# Patient Record
Sex: Female | Born: 1960 | Race: White | Hispanic: No | Smoking: Never smoker
Health system: Southern US, Community
[De-identification: ages and names within clinical notes are randomized; demographics above are authoritative.]

## PROBLEM LIST (undated history)

## (undated) DIAGNOSIS — G4700 Insomnia, unspecified: Secondary | ICD-10-CM

## (undated) DIAGNOSIS — I509 Heart failure, unspecified: Secondary | ICD-10-CM

## (undated) DIAGNOSIS — T07XXXA Unspecified multiple injuries, initial encounter: Secondary | ICD-10-CM

## (undated) DIAGNOSIS — K219 Gastro-esophageal reflux disease without esophagitis: Secondary | ICD-10-CM

## (undated) DIAGNOSIS — E7800 Pure hypercholesterolemia, unspecified: Secondary | ICD-10-CM

## (undated) DIAGNOSIS — L80 Vitiligo: Secondary | ICD-10-CM

## (undated) DIAGNOSIS — K759 Inflammatory liver disease, unspecified: Secondary | ICD-10-CM

## (undated) DIAGNOSIS — I429 Cardiomyopathy, unspecified: Secondary | ICD-10-CM

## (undated) HISTORY — DX: Pure hypercholesterolemia, unspecified: E78.00

## (undated) HISTORY — DX: Heart failure, unspecified: I50.9

## (undated) HISTORY — DX: Vitiligo: L80

## (undated) HISTORY — DX: Inflammatory liver disease, unspecified: K75.9

## (undated) HISTORY — DX: Unspecified multiple injuries, initial encounter: T07.XXXA

## (undated) HISTORY — DX: Gastro-esophageal reflux disease without esophagitis: K21.9

## (undated) HISTORY — DX: Insomnia, unspecified: G47.00

## (undated) HISTORY — DX: Cardiomyopathy, unspecified: I42.9

## (undated) HISTORY — PX: CARDIAC CATHETERIZATION: SHX172

---

## 2010-07-24 ENCOUNTER — Ambulatory Visit (HOSPITAL_COMMUNITY): Admission: RE | Admit: 2010-07-24 | Discharge: 2010-07-24 | Payer: Self-pay | Admitting: Family Medicine

## 2011-11-03 ENCOUNTER — Other Ambulatory Visit (HOSPITAL_COMMUNITY): Payer: Self-pay | Admitting: Family Medicine

## 2011-11-03 DIAGNOSIS — Z139 Encounter for screening, unspecified: Secondary | ICD-10-CM

## 2011-11-09 ENCOUNTER — Ambulatory Visit (HOSPITAL_COMMUNITY)
Admission: RE | Admit: 2011-11-09 | Discharge: 2011-11-09 | Disposition: A | Discharge status: Home / Self Care | Payer: PRIVATE HEALTH INSURANCE | Source: Ambulatory Visit | Attending: Family Medicine | Admitting: Family Medicine

## 2011-11-09 DIAGNOSIS — Z1231 Encounter for screening mammogram for malignant neoplasm of breast: Secondary | ICD-10-CM | POA: Insufficient documentation

## 2011-11-09 DIAGNOSIS — Z139 Encounter for screening, unspecified: Secondary | ICD-10-CM

## 2011-11-16 ENCOUNTER — Other Ambulatory Visit: Payer: Self-pay | Admitting: Family Medicine

## 2011-11-16 DIAGNOSIS — R928 Other abnormal and inconclusive findings on diagnostic imaging of breast: Secondary | ICD-10-CM

## 2011-11-18 ENCOUNTER — Ambulatory Visit (HOSPITAL_COMMUNITY)
Admission: RE | Admit: 2011-11-18 | Discharge: 2011-11-18 | Disposition: A | Discharge status: Home / Self Care | Payer: PRIVATE HEALTH INSURANCE | Source: Ambulatory Visit | Attending: Family Medicine | Admitting: Family Medicine

## 2011-11-18 DIAGNOSIS — R928 Other abnormal and inconclusive findings on diagnostic imaging of breast: Secondary | ICD-10-CM | POA: Insufficient documentation

## 2014-09-11 ENCOUNTER — Emergency Department (HOSPITAL_COMMUNITY): Payer: 59

## 2014-09-11 ENCOUNTER — Emergency Department (HOSPITAL_COMMUNITY)
Admission: EM | Admit: 2014-09-11 | Discharge: 2014-09-11 | Disposition: A | Discharge status: Home / Self Care | Payer: 59 | Attending: Emergency Medicine | Admitting: Emergency Medicine

## 2014-09-11 ENCOUNTER — Encounter (HOSPITAL_COMMUNITY): Payer: Self-pay | Admitting: Emergency Medicine

## 2014-09-11 DIAGNOSIS — Z79899 Other long term (current) drug therapy: Secondary | ICD-10-CM | POA: Insufficient documentation

## 2014-09-11 DIAGNOSIS — R350 Frequency of micturition: Secondary | ICD-10-CM | POA: Insufficient documentation

## 2014-09-11 DIAGNOSIS — M545 Low back pain, unspecified: Secondary | ICD-10-CM

## 2014-09-11 LAB — URINALYSIS, ROUTINE W REFLEX MICROSCOPIC
BILIRUBIN URINE: NEGATIVE
Glucose, UA: NEGATIVE mg/dL
Hgb urine dipstick: NEGATIVE
KETONES UR: NEGATIVE mg/dL
LEUKOCYTES UA: NEGATIVE
NITRITE: NEGATIVE
PROTEIN: NEGATIVE mg/dL
SPECIFIC GRAVITY, URINE: 1.015 (ref 1.005–1.030)
Urobilinogen, UA: 0.2 mg/dL (ref 0.0–1.0)
pH: 5 (ref 5.0–8.0)

## 2014-09-11 MED ORDER — NAPROXEN 500 MG PO TABS: 500.0000 mg | ORAL_TABLET | Freq: Two times a day (BID) | ORAL | Status: DC

## 2014-09-11 MED ORDER — TRAMADOL HCL 50 MG PO TABS: 50.0000 mg | ORAL_TABLET | Freq: Four times a day (QID) | ORAL | Status: DC | PRN

## 2014-09-11 MED ORDER — KETOROLAC TROMETHAMINE 60 MG/2ML IM SOLN
60.0000 mg | Freq: Once | INTRAMUSCULAR | Status: AC
  Administered 2014-09-11: 60 mg via INTRAMUSCULAR
  Filled 2014-09-11: qty 2

## 2014-09-11 MED ORDER — CYCLOBENZAPRINE HCL 5 MG PO TABS: 5.0000 mg | ORAL_TABLET | Freq: Three times a day (TID) | ORAL | Status: DC | PRN

## 2014-09-11 NOTE — ED Notes (Signed)
Patient states she being having back pain since Sunday morning. Patient states, "I though it was a kidney infection however  I my urine is clear and i haven't had in urgency. Patient states she took hyrdocodine and got very little relief.

## 2014-09-11 NOTE — ED Provider Notes (Signed)
CSN: [id number]     Arrival date & time 09/11/14  1721 History   First MD Initiated Contact with Patient 09/11/14 1738     Chief Complaint  Patient presents with  . Back Pain     (Consider location/radiation/quality/duration/timing/severity/associated sxs/prior Treatment) The history is provided by the patient.   Sara Mcmahon is a 53 y.o. female presenting bilateral low back pain which she woke with 2 mornings ago.  She denies injury or prior history of low back symptoms.  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  She does endorse increased urinary frequency without dysuria and her urine has been clear without hematuria.  She denies fevers or chills.  Pain is worsened with movement and certain positions.  She denies personal or family history of kidney stones.  She took a hydrocodone tablet which improved her symptoms, but made her very nauseated and prefers not to take this medication again.  She also took a Cipro tablet both yesterday morning and this morning over concerned this may be a UTI.  She denies abdominal pain, no nausea, vomiting, fever, bowel changes.  There is no radiation of pain into her legs and she denies weakness or numbness in her lower extremities    History reviewed. No pertinent past medical history. History reviewed. No pertinent past surgical history. No family history on file. History  Substance Use Topics  . Smoking status: Never Smoker   . Smokeless tobacco: Not on file  . Alcohol Use: No   OB History   Grav Para Term Preterm Abortions TAB SAB Ect Mult Living                 Review of Systems  Constitutional: Negative for fever and chills.  Respiratory: Negative for shortness of breath.   Cardiovascular: Negative for chest pain and leg swelling.  Gastrointestinal: Negative for abdominal pain, constipation and abdominal distention.  Genitourinary: Positive for frequency.  Negative for dysuria, urgency, flank pain and difficulty urinating.  Musculoskeletal: Positive for back pain. Negative for gait problem and joint swelling.  Skin: Negative for rash.  Neurological: Negative for weakness and numbness.      Allergies  Review of patient's allergies indicates no known allergies.  Home Medications   Prior to Admission medications   Medication Sig Start Date End Date Taking? Authorizing Provider  cyclobenzaprine (FLEXERIL) 5 MG tablet Take 1 tablet (5 mg total) by mouth 3 (three) times daily as needed for muscle spasms. 09/11/14   Burgess AmorJulie Lenka Zhao, PA-C  naproxen (NAPROSYN) 500 MG tablet Take 1 tablet (500 mg total) by mouth 2 (two) times daily with a meal. 09/11/14   Burgess AmorJulie Charolette Bultman, PA-C  traMADol (ULTRAM) 50 MG tablet Take 1 tablet (50 mg total) by mouth every 6 (six) hours as needed. 09/11/14   Burgess AmorJulie Lisha Vitale, PA-C   BP 124/78  Pulse 78  Temp(Src) 98.2 F (36.8 C) (Oral)  Resp 16  Ht 5\' 4"  (1.626 m)  Wt 139 lb (63.05 kg)  BMI 23.85 kg/m2  SpO2 100% Physical Exam  Nursing note and vitals reviewed. Constitutional: She appears well-developed and well-nourished.  HENT:  Head: Normocephalic.  Eyes: Conjunctivae are normal.  Neck: Normal range of motion. Neck supple.  Cardiovascular: Normal rate and intact distal pulses.   Pedal pulses normal.  Pulmonary/Chest: Effort normal.  Abdominal: Soft. Bowel sounds are normal. She exhibits no distension, no pulsatile midline mass and no mass.  There is no tenderness. There is no rebound, no guarding and no CVA tenderness.  Musculoskeletal: Normal range of motion. She exhibits no edema.       Lumbar back: She exhibits tenderness. She exhibits no swelling, no edema and no spasm.  Tender to palpation across her lower lumbar region, bilaterally including midline.  Neurological: She is alert. She has normal strength. She displays no atrophy and no tremor. No sensory deficit. Gait normal.  Reflex Scores:      Patellar reflexes are  2+ on the right side and 2+ on the left side.      Achilles reflexes are 2+ on the right side and 2+ on the left side. No strength deficit noted in hip and knee flexor and extensor muscle groups.  Ankle flexion and extension intact.  Skin: Skin is warm and dry.  Psychiatric: She has a normal mood and affect.    ED Course  Procedures (including critical care time) Labs Review Labs Reviewed  URINALYSIS, ROUTINE W REFLEX MICROSCOPIC    Imaging Review Dg Lumbar Spine Complete  09/11/2014   CLINICAL DATA:  Back pain. 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  EXAM: LUMBAR SPINE - COMPLETE 4+ VIEW  COMPARISON:  None.  FINDINGS: Normal alignment of the lumbar vertebral bodies. Disc spaces and vertebral bodies are maintained. The facets are normally aligned. No pars defects. The visualized bony pelvis is intact.  IMPRESSION: Normal alignment and no acute bony findings or significant degenerative changes.   Electronically Signed   By: Loralie Champagne M.D.   On: 09/11/2014 18:39     EKG Interpretation None      MDM   Final diagnoses:  Bilateral low back pain without sciatica    No neuro deficit on exam or by history to suggest emergent or surgical presentation.  Also discussed worsened sx that should prompt immediate re-evaluation including distal weakness, bowel/bladder retention/incontinence.  Patients labs and/or radiological studies were viewed and considered during the medical decision making and disposition process. Patient was prescribed Flexeril, Naprosyn and tramadol for when necessary use.  Encouraged activity as tolerated, heating pad or warm tub soak.  Followup with PCP if not improving over the next week.      Burgess Amor, PA-C 09/11/14 1950

## 2014-09-11 NOTE — ED Notes (Signed)
Patient given discharge instruction, verbalized understand. Patient ambulatory out of the department.  

## 2014-09-11 NOTE — ED Provider Notes (Signed)
Medical screening examination/treatment/procedure(s) were performed by non-physician practitioner and as supervising physician I was immediately available for consultation/collaboration.   EKG Interpretation None        Benny LennertJoseph L Juddson Cobern, MD 09/11/14 2344

## 2014-09-11 NOTE — Discharge Instructions (Signed)
Back Pain, Adult °Low back pain is very common. About 1 in 5 people have back pain. The cause of low back pain is rarely dangerous. The pain often gets better over time. About half of people with a sudden onset of back pain feel better in just 2 weeks. About 8 in 10 people feel better by 6 weeks.  °CAUSES °Some common causes of back pain include: °· Strain of the muscles or ligaments supporting the spine. °· Wear and tear (degeneration) of the spinal discs. °· Arthritis. °· Direct injury to the back. °DIAGNOSIS °Most of the time, the direct cause of low back pain is not known. However, back pain can be treated effectively even when the exact cause of the pain is unknown. Answering your caregiver's questions about your overall health and symptoms is one of the most accurate ways to make sure the cause of your pain is not dangerous. If your caregiver needs more information, he or she may order lab work or imaging tests (X-rays or MRIs). However, even if imaging tests show changes in your back, this usually does not require surgery. °HOME CARE INSTRUCTIONS °For many people, back pain returns. Since low back pain is rarely dangerous, it is often a condition that people can learn to manage on their own.  °· Remain active. It is stressful on the back to sit or stand in one place. Do not sit, drive, or stand in one place for more than 30 minutes at a time. Take short walks on level surfaces as soon as pain allows. Try to increase the length of time you walk each day. °· Do not stay in bed. Resting more than 1 or 2 days can delay your recovery. °· Do not avoid exercise or work. Your body is made to move. It is not dangerous to be active, even though your back may hurt. Your back will likely heal faster if you return to being active before your pain is gone. °· Pay attention to your body when you  bend and lift. Many people have less discomfort when lifting if they bend their knees, keep the load close to their bodies, and  avoid twisting. Often, the most comfortable positions are those that put less stress on your recovering back. °· Find a comfortable position to sleep. Use a firm mattress and lie on your side with your knees slightly bent. If you lie on your back, put a pillow under your knees. °· Only take over-the-counter or prescription medicines as directed by your caregiver. Over-the-counter medicines to reduce pain and inflammation are often the most helpful. Your caregiver may prescribe muscle relaxant drugs. These medicines help dull your pain so you can more quickly return to your normal activities and healthy exercise. °· Put ice on the injured area. °¨ Put ice in a plastic bag. °¨ Place a towel between your skin and the bag. °¨ Leave the ice on for 15-20 minutes, 03-04 times a day for the first 2 to 3 days. After that, ice and heat may be alternated to reduce pain and spasms. °· Ask your caregiver about trying back exercises and gentle massage. This may be of some benefit. °· Avoid feeling anxious or stressed. Stress increases muscle tension and can worsen back pain. It is important to recognize when you are anxious or stressed and learn ways to manage it. Exercise is a great option. °SEEK MEDICAL CARE IF: °· You have pain that is not relieved with rest or medicine. °· You have pain that does not improve in 1 week. °· You have new symptoms. °· You are generally not feeling well. °SEEK   IMMEDIATE MEDICAL CARE IF:   You have pain that radiates from your back into your legs.  You develop new bowel or bladder control problems.  You have unusual weakness or numbness in your arms or legs.  You develop nausea or vomiting.  You develop abdominal pain.  You feel faint. Document Released: 11/23/2005 Document Revised: 05/24/2012 Document Reviewed: 03/27/2014 [facility] Patient Information 2015 [place], [place]. This information is not intended to replace advice given to you by your health care provider. Make sure you  discuss any questions you have with your health care provider.   Emergency Department Resource Guide 1) Find a Doctor and Pay Out of Pocket Although you won't have to find out who is covered by your insurance plan, it is a good idea to ask around and get recommendations. You will then need to call the office and see if the doctor you have chosen will accept you as a new patient and what types of options they offer for patients who are self-pay. Some doctors offer discounts or will set up payment plans for their patients who do not have insurance, but you will need to ask so you aren't surprised when you get to your appointment.  2) Contact Your Local Health Department Not all health departments have doctors that can see patients for sick visits, but many do, so it is worth a call to see if yours does. If you don't know where your local health department is, you can check in your phone book. The CDC also has a tool to help you locate your state's health department, and many state websites also have listings of all of their local health departments.  3) Find a [facility] If your illness is not likely to be very severe or complicated, you may want to try a walk in clinic. These are popping up all over the country in pharmacies, drugstores, and shopping centers. They're usually staffed by nurse practitioners or physician assistants that have been trained to treat common illnesses and complaints. They're usually fairly quick and inexpensive. However, if you have serious medical issues or chronic medical problems, these are probably not your best option.  No Primary Care Doctor: - Call Health Connect at  [phone] - they can help you locate a primary care doctor that  accepts your insurance, provides certain services, etc. - Physician Referral Service- [phone]  Chronic Pain Problems: Organization         Address  Phone   Notes  [facility]  [phone] Patients need to  be referred by their primary care doctor.   Medication Assistance: Organization         Address  Phone   Notes  [facility] Medication [facility] [address] [place], [place] [address] [phone] --Must be a resident of [facility] -- Must have NO insurance coverage whatsoever (no Medicaid/ Medicare, etc.) -- The pt. MUST have a primary care doctor that directs their care regularly and follows them in the community   MedAssist  [phone]   [employer]  [phone]    Agencies that provide inexpensive medical care: Organization         Address  Phone   Notes  [place]  [phone]   [facility]    [phone]   [facility] [place], [place] [address] [phone]   [place] [address]  [place] [phone]   Planned Parenthood    [phone]   [facility]    [phone]   Community Health and [place], [place] Phone:  [phone], Fax:  [phone] Hours of Operation:  9 am - 6 pm, M-F.  Also accepts Medicaid/Medicare and self-pay.  [facility] for [place], [address], [place] Phone: [phone], Fax: [phone]. Hours of Operation:  8:30 am - 5:30 pm, M-F.  Also accepts Medicaid and self-pay.  [facility] [place] [address], [place] Phone: [phone]   [place], [place], [place] [phone], Ext. [phone] Mondays & Thursdays: 7-9 AM.  First 15 patients are seen on a first come, first serve basis.    [place] Providers:  Organization         Address  Phone   Notes  [facility] [address], [place] [phone] Also accepts self-pay patients.  [facility] [address], [place]  [phone]   [place], [address], [place] [phone]   [facility] [address] [place] [phone]   Lucianne Lei [address], [place]   [phone] Only accepts [place] Access [place] patients after they have their name applied to their card.   Self-Pay (no insurance) in [facility]:  Organization         Address  Phone   Notes  Sickle Cell Patients, [facility] [place] [phone]   [facility] [place] [phone]   [facility] [place]  [place], [place], [place] [phone]   [facility]/Dr. Osei-Bonsu  [address], [place] or [address], [place] [phone] Phone number for both [place] and [place] locations is the same.  [facility] [address], [place] [phone]   [facility] [address] [place] or [address] [phone]   [facility] [address] [place] [phone], phone; [phone], fax Sees patients 1st and 3rd Saturday of every month.  Must not qualify for public or private insurance (i.e. Medicaid, Medicare, [place] Health Choice, Veterans' Benefits)  Household income should be no more than 200% of the poverty level The clinic cannot treat you if you are pregnant or think you are pregnant  Sexually transmitted diseases are not treated at the clinic.    Dental Care: Organization         Address  Phone  Notes  [facility] Department of [facility] [place] [phone] Accepts children up to age 110 who are enrolled in [place] or [place]; pregnant women with a Medicaid card; and children who have applied for Medicaid or [place] Health Choice, but were declined, whose parents can  pay a reduced fee at time of service.  [facility] of [facility]  [address], [place] [phone] Accepts children up to age 35 who are enrolled in [place] or [place]; pregnant women with a Medicaid card; and children who have applied for Medicaid or [place]  Health Choice, but were declined, whose parents can pay a reduced fee at time of service.  [place] Adult Dental Access PROGRAM  [place] [phone] Patients are seen by appointment only. Walk-ins are not accepted. [place] will see patients 54 years of age and older. Monday - Tuesday (8am-5pm) Most Wednesdays (8:30-5pm) $30 per visit, cash only  [facility] Adult Dental Access PROGRAM  [address], [facility] [phone] Patients are seen by appointment only. Walk-ins are not accepted. [place] will see patients 74 years of age and older. One Wednesday Evening (Monthly: Volunteer Based).  $30 per visit, cash only  [place]  [phone] for adults; Children under age 11, call Graduate Pediatric Dentistry at [phone]. Children aged 13-14, please call [phone] to request a pediatric application.  Dental services are provided in all areas of dental care including fillings, crowns and bridges, complete and partial dentures, implants, gum treatment, root canals, and extractions. Preventive care is also provided. Treatment is provided to both adults and children. Patients are selected via a lottery and there is often a waiting list.   [place] [address], [place]  [phone] [email or web address]   [facility] [address] [place], [place] [phone], Ext. [phone] Second and Fourth Thursday of each month, opens at 6:30 AM; Clinic ends at 9 AM.  Patients are seen on a first-come first-served basis, and a limited number are seen during each clinic.   [facility]  [address] Hillard Danker [place], [place] [phone]   Eligibility Requirements You must have lived in [place], [place], or [place] counties for at least the last three months.   You cannot be eligible for state or federal sponsored [employer], including [employer], [place], or [employer].   You generally cannot be eligible for healthcare insurance through your employer.    How to apply: Eligibility screenings are held every Tuesday and Wednesday afternoon from 1:00 pm until 4:00 pm. You do not need an appointment for the interview!  [facility] [address], [place], [place]   [place]  [place] Department  [place]  [phone]    Behavioral Health Resources in the Community: Intensive Outpatient Programs Organization         Address  Phone  Notes  [facility] [place]. [address] [place], [place] [phone]   [facility] Outpatient [address] [place], [place]   ADS: Alcohol & Drug Svcs [address], [place], [place]   [place] [address]  [place], [place] or [phone]   Substance Abuse Resources Organization         Address  Phone  Notes  Alcohol and Drug Services  [phone]   [place]  [phone]   [facility] [place]   Chinita Pester  [phone]   Residential & Outpatient Substance Abuse Program  [phone]   Psychological Services Organization         Address  Phone  Notes  [facility] [place]  [place]  [phone]   [place] [address] [place] or [phone]    Mobile Crisis Teams Organization         Address  Phone  Notes  [facility], Mobile Crisis Care Unit  [phone]    [facility]  [address] [place], [place] [phone]   [facility] [address] [place] [phone]    Self-Help/Support Groups Organization         Address  Phone             Notes  [facility]ental Health Assoc. of [place] - variety of support groups  [phone]- [phone] Call for more information  Narcotics Anonymous (NA), Caring [facility] [address], [place]  2 meetings at this location   Statisticianesidential Treatment Programs Organization         Address  Phone  Notes  [facility] [address],    [place]  [phone]   [facility]  [address], [place] [phone], [place], [place] [phone]   [facility] [address], [place] [phone] Admissions: 8am-3pm M-F  [facility] [address]    [place], [place] [phone]   [facility] [address], [place], [place] [phone]   [facility] [address]  [place], [place] [phone]   [facility] - Intensive Outpatient [address], [place], [place] [phone]   [facility] ([facility] [address]  [place], [place] [phone] or [phone]   [facility] (RTS) [address] [place], [place] [phone] Accepts Medicaid  [facility] [address]  [place] [phone] Substance Abuse/Addiction Treatment   [facility] Behavioral Health Resources Organization         Address  Phone  Notes  [facility]  [phone]   Angie FavaJulie Brannon, PhD [address], [address] [place], [place]   [phone](336) [phone] or [phone]   [facility] [place]   [address] [place], [place] [phone]) [phone]   [facility] [address], [place], [place] [phone](336) [phone] Insurance/Medicaid/sponsorship through [facility]  [facility] [address]                                     [place], [place] [phone](336) [phone] Therapy/tele-psych/case  [facility] [address]   [place], [place] [phone](336) [phone]    Dr. Lolly MustacheArfeen  [phone]   [facility]  [facility] 1) [address], [place] 2) [address], [place] 3)  [address], [place] [phone](336) [phone](336) [phone]  [phone]   [facility] Child Abuse Hotline [phone]) [phone] or [phone](336) [phone] (After Hours)          Do not drive within 4 hours of taking tramadol as this may make you drowsy.  Avoid lifting,  Bending,  Twisting or any other activity that worsens your pain over the next week.  Apply heat to your back as discussed several times daily.  You should get rechecked if your symptoms are not better over the next 5 days,  Or you develop increased pain,  Weakness in your leg(s) or loss of bladder or bowel function - these are symptoms of a worse injury.

## 2015-12-11 ENCOUNTER — Encounter (HOSPITAL_COMMUNITY): Payer: BLUE CROSS/BLUE SHIELD

## 2016-02-05 ENCOUNTER — Encounter: Payer: Self-pay | Admitting: *Deleted

## 2016-02-05 ENCOUNTER — Ambulatory Visit (INDEPENDENT_AMBULATORY_CARE_PROVIDER_SITE_OTHER): Payer: BLUE CROSS/BLUE SHIELD | Admitting: Cardiovascular Disease

## 2016-02-05 VITALS — BP 128/80 | HR 72 | Ht 64.0 in | Wt 136.0 lb

## 2016-02-05 DIAGNOSIS — I5181 Takotsubo syndrome: Secondary | ICD-10-CM | POA: Diagnosis not present

## 2016-02-05 DIAGNOSIS — R5383 Other fatigue: Secondary | ICD-10-CM | POA: Diagnosis not present

## 2016-02-05 DIAGNOSIS — R0601 Orthopnea: Secondary | ICD-10-CM

## 2016-02-05 DIAGNOSIS — I471 Supraventricular tachycardia, unspecified: Secondary | ICD-10-CM

## 2016-02-05 NOTE — Progress Notes (Signed)
Patient ID: Sara Mcmahon, female   DOB: December 06, 1961, 55 y.o.   MRN: [id number]       CARDIOLOGY CONSULT NOTE  Patient ID: Sara Mcmahon MRN: [id number] DOB/AGE: 01/22/1961 55 y.o.  Admit date: (Not on file) Primary Physician Gwenyth Bender, MD  Reason for Consultation: chest pain  HPI: The patient is a 55 year old woman with a past medical history significant for Takotsubo cardiomyopathy and PSVT. She underwent coronary angiography in September 2016 which was normal. LVEF at that time was 20-25%. She reportedly underwent repeat echocardiogram in October and left ventricular systolic function had normalized. She saw a cardiologist in late 2016 for palpitations and wore a 48-hour Holter monitor which demonstrated sinus rhythm with PACs. She also has a h/o anxiety.  Ever since being diagnosed in September 2016, she has felt markedly fatigued. This reportedly did not improve with normalization of LV function in October. I do not have a copy of the echocardiogram report from this time. She recently had blood work done but these results are also unavailable. She occasionally has a squeezing sensation in her left precordium approximately 3 times per week. This usually occurs at rest. She props her head up on at least 2-3 pillows in order to breathe more comfortably. She denies leg swelling and paroxysmal nocturnal dyspnea. She says she knows something is wrong.  Prior to being diagnosed, she was undergoing a lot of stressors at home as well as regarding finances.   Allergies  Allergen Reactions  . Pollen Extract     Trees, grass, flowers    Current Outpatient Prescriptions  Medication Sig Dispense Refill  . ALPRAZolam (XANAX) 0.5 MG tablet Take 0.5 mg by mouth at bedtime as needed for anxiety.    Marland Kitchen aspirin EC 81 MG tablet Take 81 mg by mouth daily.    . carvedilol (COREG) 3.125 MG tablet Take 1 tablet by mouth 2 (two) times daily.  0  . eszopiclone (LUNESTA) 2 MG TABS tablet Take 1 tablet by  mouth at bedtime.  0  . nitroGLYCERIN (NITROSTAT) 0.4 MG SL tablet Place 0.4 mg under the tongue every 5 (five) minutes as needed for chest pain.    . rosuvastatin (CRESTOR) 5 MG tablet Take 5 mg by mouth daily.  0   No current facility-administered medications for this visit.    Past Medical History  Diagnosis Date  . Hypercholesteremia   . Insomnia   . CHF (congestive heart failure) (HCC)   . Cardiomyopathy [facility])     Past Surgical History  Procedure Laterality Date  . Cardiac catheterization      Social History   Social History  . Marital Status: Widowed    Spouse Name: N/A  . Number of Children: N/A  . Years of Education: N/A   Occupational History  . Not on file.   Social History Main Topics  . Smoking status: Never Smoker   . Smokeless tobacco: Never Used  . Alcohol Use: No  . Drug Use: No  . Sexual Activity: Not on file   Other Topics Concern  . Not on file   Social History Narrative     No family history of premature CAD in 1st degree relatives.  Prior to Admission medications   Medication Sig Start Date End Date Taking? Authorizing Provider  acetaminophen (TYLENOL) 325 MG tablet Take 650 mg by mouth every 4 (four) hours as needed.    Historical Provider, MD  ALPRAZolam (NIRAVAM) 0.5 MG dissolvable tablet Take 1 tablet  by mouth daily. 12/31/15   Historical Provider, MD  aspirin EC 81 MG tablet Take 81 mg by mouth daily.    Historical Provider, MD  carvedilol (COREG) 3.125 MG tablet Take 1 tablet by mouth 2 (two) times daily. 01/27/16   Historical Provider, MD  cyclobenzaprine (FLEXERIL) 5 MG tablet Take 1 tablet (5 mg total) by mouth 3 (three) times daily as needed for muscle spasms. 09/11/14   Burgess Amor, PA-C  eszopiclone (LUNESTA) 2 MG TABS tablet Take 1 tablet by mouth at bedtime. 01/10/16   Historical Provider, MD  naproxen (NAPROSYN) 500 MG tablet Take 1 tablet (500 mg total) by mouth 2 (two) times daily with a meal. 09/11/14   Burgess Amor, PA-C    nitroGLYCERIN (NITROSTAT) 0.4 MG SL tablet Place 0.4 mg under the tongue every 5 (five) minutes as needed for chest pain.    Historical Provider, MD  rosuvastatin (CRESTOR) 5 MG tablet Take 5 mg by mouth daily. 01/21/16   Historical Provider, MD  traMADol (ULTRAM) 50 MG tablet Take 1 tablet (50 mg total) by mouth every 6 (six) hours as needed. 09/11/14   Burgess Amor, PA-C     Review of systems complete and found to be negative unless listed above in HPI     Physical exam Height  (1.626 m), weight 136 lb (61.689 kg). General: NAD Neck: No JVD, no thyromegaly or thyroid nodule.  Lungs: Clear to auscultation bilaterally with normal respiratory effort. CV: Nondisplaced PMI. Regular rate and rhythm, normal S1/S2, no S3/S4, no murmur.  No peripheral edema.  No carotid bruit.  Normal pedal pulses.  Abdomen: Soft, nontender, no hepatosplenomegaly, no distention.  Skin: Intact without lesions or rashes.  Neurologic: Alert and oriented x 3.  Psych: Normal affect. Extremities: No clubbing or cyanosis.  HEENT: Normal.   ECG: Most recent ECG reviewed.  Labs:  No results found for: WBC, HGB, HCT, MCV, PLT No results for input(s): NA, K, CL, CO2, BUN, CREATININE, CALCIUM, PROT, BILITOT, ALKPHOS, ALT, AST, GLUCOSE in the last 168 hours.  Invalid input(s): LABALBU No results found for: CKTOTAL, CKMB, CKMBINDEX, TROPONINI No results found for: CHOL No results found for: HDL No results found for: LDLCALC No results found for: TRIG No results found for: CHOLHDL No results found for: LDLDIRECT       Studies: No results found.  ASSESSMENT AND PLAN:  1. Takotsubo cardiomyopathy with fatigue and orthopnea: Has symptoms of fatigue and orthopnea. These have occurred in spite of reported normalization of LV function. Continue Coreg for now but I wonder if symptoms are related to the medication itself. I will repeat an echocardiogram to evaluate cardiac structure and function. I will also obtain  a copy of the echocardiogram report from October 2016 as well as recent blood tests.  2. PSVT: Continue Coreg. Recent Holter reviewed above with no significant arrhythmias.   Dispo: f/u 6-8 weeks.   Signed: Prentice Docker, M.D., F.A.C.C.  02/05/2016, 2:30 PM

## 2016-02-05 NOTE — Patient Instructions (Signed)
Your physician recommends that you continue on your current medications as directed. Please refer to the Current Medication list given to you today. Your physician has requested that you have an echocardiogram. Echocardiography is a painless test that uses sound waves to create images of your heart. It provides your doctor with information about the size and shape of your heart and how well your heart's chambers and valves are working. This procedure takes approximately one hour. There are no restrictions for this procedure. Your physician recommends that you schedule a follow-up appointment in: 6-8 weeks.

## 2016-02-06 ENCOUNTER — Telehealth: Payer: Self-pay | Admitting: *Deleted

## 2016-02-06 NOTE — Telephone Encounter (Signed)
-----   Message from Eustace Moore, LPN sent at 12/12/1094  9:57 AM EST -----   ----- Message -----    From: Laqueta Linden, MD    Sent: 02/06/2016   9:35 AM      To: Eustace Moore, LPN  Please inform the patient that I reviewed the echo report which demonstrated normal heart function.

## 2016-02-06 NOTE — Telephone Encounter (Signed)
Notes Recorded by Lesle Chris, LPN on 12/11/7827 at 10:45 AM Patient notified.

## 2016-02-19 ENCOUNTER — Ambulatory Visit (INDEPENDENT_AMBULATORY_CARE_PROVIDER_SITE_OTHER): Payer: BLUE CROSS/BLUE SHIELD

## 2016-02-19 ENCOUNTER — Other Ambulatory Visit: Payer: Self-pay

## 2016-02-19 DIAGNOSIS — I5181 Takotsubo syndrome: Secondary | ICD-10-CM | POA: Diagnosis not present

## 2016-03-04 ENCOUNTER — Telehealth: Payer: Self-pay | Admitting: Cardiovascular Disease

## 2016-03-04 DIAGNOSIS — R609 Edema, unspecified: Secondary | ICD-10-CM

## 2016-03-04 MED ORDER — FUROSEMIDE 20 MG PO TABS: 20.0000 mg | ORAL_TABLET | Freq: Every day | ORAL | Status: DC

## 2016-03-04 MED ORDER — POTASSIUM CHLORIDE ER 10 MEQ PO TBCR: 10.0000 meq | EXTENDED_RELEASE_TABLET | Freq: Every day | ORAL | Status: DC

## 2016-03-04 NOTE — Telephone Encounter (Signed)
Pt aware, meds sent to pharmacy, pt will come by office to pick up lab orders, says she will have them done Friday at [facility].

## 2016-03-04 NOTE — Telephone Encounter (Signed)
Mrs. Sara Mcmahon called stating that she is experiencing right leg and ankle edema.  States that she is having some shortness of breath. Gained approximately 2-3 lbs in the last 4 days.

## 2016-03-04 NOTE — Telephone Encounter (Signed)
Start Lasix 20 mg daily. Give KCl 10 meq daily. Check BMET in 2 days.

## 2016-03-04 NOTE — Telephone Encounter (Signed)
Pt says swelling in R leg has worsened in the last week, with some swelling in the abdomin with cramping just last night. Denies pitting, CP, SOB. Gained 2 lbs in 4-5 days. Pt has stopped taking Crestor 3 days ago says gave her severe headache. Pt thinks she need to restart spironolactone. Will forward to Dr. Purvis SheffieldKoneswaran

## 2016-03-12 ENCOUNTER — Telehealth: Payer: Self-pay | Admitting: Cardiovascular Disease

## 2016-03-12 MED ORDER — HYDROCHLOROTHIAZIDE 12.5 MG PO CAPS: ORAL_CAPSULE | ORAL | Status: DC

## 2016-03-12 NOTE — Telephone Encounter (Signed)
Can try HCTZ 12.5 mg daily x 3 days.

## 2016-03-12 NOTE — Telephone Encounter (Signed)
Mrs. Sara Mcmahon called stating that she can not take the Lasix.  C/o dizziness and a headache.

## 2016-03-12 NOTE — Telephone Encounter (Signed)
Pt made aware of medication change. Put the lasix allergy in her chart.

## 2016-03-12 NOTE — Telephone Encounter (Signed)
Called pt. No answer, left message for her to call back. 4/6- LM

## 2016-03-14 ENCOUNTER — Telehealth: Payer: Self-pay | Admitting: Cardiology

## 2016-03-14 ENCOUNTER — Emergency Department (HOSPITAL_COMMUNITY)
Admission: EM | Admit: 2016-03-14 | Discharge: 2016-03-14 | Disposition: A | Discharge status: Home / Self Care | Payer: BLUE CROSS/BLUE SHIELD | Attending: Emergency Medicine | Admitting: Emergency Medicine

## 2016-03-14 ENCOUNTER — Encounter (HOSPITAL_COMMUNITY): Payer: Self-pay | Admitting: Emergency Medicine

## 2016-03-14 DIAGNOSIS — Z79899 Other long term (current) drug therapy: Secondary | ICD-10-CM | POA: Insufficient documentation

## 2016-03-14 DIAGNOSIS — Z7982 Long term (current) use of aspirin: Secondary | ICD-10-CM | POA: Insufficient documentation

## 2016-03-14 DIAGNOSIS — R1310 Dysphagia, unspecified: Secondary | ICD-10-CM | POA: Diagnosis present

## 2016-03-14 DIAGNOSIS — I509 Heart failure, unspecified: Secondary | ICD-10-CM | POA: Diagnosis not present

## 2016-03-14 DIAGNOSIS — R590 Localized enlarged lymph nodes: Secondary | ICD-10-CM | POA: Diagnosis not present

## 2016-03-14 NOTE — Discharge Instructions (Signed)

## 2016-03-14 NOTE — ED Notes (Signed)
Patient c/o pain in right side of neck and difficulty swallowing. Per patient has been snoring, which is unlike her. Per patient area painful to touch and throat is red.Patient able to handle secretions. Denies any fevers.

## 2016-03-14 NOTE — ED Notes (Signed)
Patient to nursing station stating she was leaving to get something to eat, the physician has seen her but she does not have time to wait for discharge papers.

## 2016-03-14 NOTE — Telephone Encounter (Signed)
Pt called with swelling in throat on one side- significant with snoring last night.  She had thought she had reaction to lasix but now that side of throat is painful to touch --instructed to go to urgent care or ER to be evaluated.  She is agreeable to this.

## 2016-03-17 ENCOUNTER — Ambulatory Visit (INDEPENDENT_AMBULATORY_CARE_PROVIDER_SITE_OTHER): Payer: BLUE CROSS/BLUE SHIELD | Admitting: Cardiovascular Disease

## 2016-03-17 ENCOUNTER — Encounter: Payer: Self-pay | Admitting: Cardiovascular Disease

## 2016-03-17 VITALS — BP 112/69 | HR 74 | Ht 64.0 in | Wt 136.0 lb

## 2016-03-17 DIAGNOSIS — I471 Supraventricular tachycardia: Secondary | ICD-10-CM

## 2016-03-17 DIAGNOSIS — R5383 Other fatigue: Secondary | ICD-10-CM | POA: Diagnosis not present

## 2016-03-17 DIAGNOSIS — I5181 Takotsubo syndrome: Secondary | ICD-10-CM

## 2016-03-17 DIAGNOSIS — M25471 Effusion, right ankle: Secondary | ICD-10-CM

## 2016-03-17 NOTE — Progress Notes (Signed)
Patient ID: Sara Mcmahon, female   DOB: 01-31-1961, 55 y.o.   MRN: [id number]      SUBJECTIVE: The patient returns for follow-up after undergoing cardiovascular testing performed for the evaluation of fatigue and orthopnea.  Echocardiogram on 02/19/16 demonstrated normal left ventricular systolic function and regional wall motion, EF 55-60%, mild LVH, grade 1 diastolic dysfunction.  She has called our office multiple times since her last visit. She has a h/o anxiety.  She complained of right leg and ankle edema and 2-3 lb weight gain. I started Lasix 20 mg daily along with KCl. She called back stating Lasix intolerance (dizziness and headache) so I prescribed HCTZ 12.5 mg.  She then called complaining of unilateral throat swelling with snoring and was instructed to go to the ED by one of our NP's. There were only RN notes in the medical record.  She denies shortness of breath today as well as right ankle swelling. Says her grandmother had bad rheumatoid arthritis. Also complains of right index finger stiffness and sleep difficulties with fatigue. Took Lunesta for some time, now takes Xanax.   Review of Systems: As per "subjective", otherwise negative.  Allergies  Allergen Reactions  . Lasix [Furosemide] Other (See Comments)    Headache, dizziness & throat irritation  . Pollen Extract     Trees, grass, flowers    Current Outpatient Prescriptions  Medication Sig Dispense Refill  . ALPRAZolam (XANAX) 0.5 MG tablet Take 0.5 mg by mouth at bedtime as needed for anxiety.    Marland Kitchen aspirin EC 81 MG tablet Take 81 mg by mouth daily.    . carvedilol (COREG) 3.125 MG tablet Take 1 tablet by mouth 2 (two) times daily.  0  . eszopiclone (LUNESTA) 2 MG TABS tablet Take 1 tablet by mouth at bedtime as needed for sleep.   0  . nitroGLYCERIN (NITROSTAT) 0.4 MG SL tablet Place 0.4 mg under the tongue every 5 (five) minutes as needed for chest pain.    . potassium chloride (K-DUR) 10 MEQ tablet Take 1  tablet (10 mEq total) by mouth daily. 30 tablet 3  . hydrochlorothiazide (MICROZIDE) 12.5 MG capsule Take 1 tablet daily for the next 3 days (Patient not taking: Reported on 03/17/2016) 5 capsule 0   No current facility-administered medications for this visit.    Past Medical History  Diagnosis Date  . Hypercholesteremia   . Insomnia   . CHF (congestive heart failure) (HCC)   . Cardiomyopathy [facility])     Past Surgical History  Procedure Laterality Date  . Cardiac catheterization      Social History   Social History  . Marital Status: Widowed    Spouse Name: N/A  . Number of Children: N/A  . Years of Education: N/A   Occupational History  . Not on file.   Social History Main Topics  . Smoking status: Never Smoker   . Smokeless tobacco: Never Used  . Alcohol Use: No  . Drug Use: No  . Sexual Activity: Not on file   Other Topics Concern  . Not on file   Social History Narrative     Filed Vitals:   03/17/16 0921  BP: 112/69  Pulse: 74  Height:  (1.626 m)  Weight: 136 lb (61.689 kg)    PHYSICAL EXAM General: NAD HEENT: Normal. Neck: No JVD, no thyromegaly. Lungs: Clear to auscultation bilaterally with normal respiratory effort. CV: Nondisplaced PMI.  Regular rate and rhythm, normal S1/S2, no S3/S4, no murmur. No  pretibial or periankle edema.  No carotid bruit.   Abdomen: Soft, nontender, no distention.  Neurologic: Alert and oriented.  Psych: Normal affect. Skin: Normal. Musculoskeletal: No gross deformities.  ECG: Most recent ECG reviewed.      ASSESSMENT AND PLAN: 1. Takotsubo cardiomyopathy with fatigue: Normal LV systolic function. Symptoms are not cardiac in etiology.  2. PSVT: Continue Coreg. Recent Holter previously reviewed with no significant arrhythmias.  3. Fatigue with snoring: Probably warrants a sleep study to rule out sleep apnea. Will defer further workup to PCP.  Dispo: f/u 1 year.  Time spent: 40 minutes, of which greater  than 50% was spent reviewing symptoms, relevant blood tests and studies, and discussing management plan with the patient.   Prentice DockerSuresh Koneswaran, M.D., F.A.C.C.

## 2016-03-17 NOTE — Patient Instructions (Signed)
Continue all current medications. Your physician wants you to follow up in:  1 year.  You will receive a reminder letter in the mail one-two months in advance.  If you don't receive a letter, please call our office to schedule the follow up appointment   

## 2016-03-25 ENCOUNTER — Other Ambulatory Visit: Payer: Self-pay | Admitting: *Deleted

## 2016-03-25 MED ORDER — HYDROCHLOROTHIAZIDE 12.5 MG PO CAPS: ORAL_CAPSULE | ORAL | Status: DC

## 2016-03-25 NOTE — ED Provider Notes (Signed)
CSN: [id number]     Arrival date & time 03/14/16  1242 History   First MD Initiated Contact with Patient 03/14/16 1311     Chief Complaint  Patient presents with  . Dysphagia     (Consider location/radiation/quality/duration/timing/severity/associated sxs/prior Treatment) HPI   55 year old female with a lump in the right side of her neck. Gradual onset of couple days ago. She denies any trauma. Just some mild pain at the site of it. She says she has also been snoring recently which is unusual for her. No fevers or chills. No sore throat. No difficulty with her breathing. No nausea or vomiting.  Past Medical History  Diagnosis Date  . Hypercholesteremia   . Insomnia   . CHF (congestive heart failure) (HCC)   . Cardiomyopathy [facility](HCC)    Past Surgical History  Procedure Laterality Date  . Cardiac catheterization     Family History  Problem Relation Age of Onset  . Hypertension Mother   . Cancer Mother   . Heart disease Father   . Hypertension Father    Social History  Substance Use Topics  . Smoking status: Never Smoker   . Smokeless tobacco: Never Used  . Alcohol Use: No   OB History    Gravida Para Term Preterm AB TAB SAB Ectopic Multiple Living   2 2 2             Review of Systems  All systems reviewed and negative, other than as noted in HPI.   Allergies  Lasix and Pollen extract  Home Medications   Prior to Admission medications   Medication Sig Start Date End Date Taking? Authorizing Provider  ALPRAZolam Prudy Feeler(XANAX) 0.5 MG tablet Take 0.5 mg by mouth at bedtime as needed for anxiety.   Yes Historical Provider, MD  aspirin EC 81 MG tablet Take 81 mg by mouth daily.   Yes Historical Provider, MD  carvedilol (COREG) 3.125 MG tablet Take 1 tablet by mouth 2 (two) times daily. 01/27/16  Yes Historical Provider, MD  eszopiclone (LUNESTA) 2 MG TABS tablet Take 1 tablet by mouth at bedtime as needed for sleep.  01/10/16  Yes Historical Provider, MD  nitroGLYCERIN  (NITROSTAT) 0.4 MG SL tablet Place 0.4 mg under the tongue every 5 (five) minutes as needed for chest pain.   Yes Historical Provider, MD  potassium chloride (K-DUR) 10 MEQ tablet Take 1 tablet (10 mEq total) by mouth daily. 03/04/16  Yes Laqueta LindenSuresh A Koneswaran, MD  hydrochlorothiazide (MICROZIDE) 12.5 MG capsule Take 1 tablet daily for the next 3 days 03/25/16   Laqueta LindenSuresh A Koneswaran, MD   BP 106/75 mmHg  Pulse 69  Temp(Src) 98.8 F (37.1 C) (Oral)  Resp 16  Ht 5\' 4"  (1.626 m)  Wt 135 lb (61.236 kg)  BMI 23.16 kg/m2  SpO2 100% Physical Exam  Constitutional: She appears well-developed and well-nourished. No distress.  HENT:  Head: Normocephalic and atraumatic.  Mildly enlarged but not sniffily tender right anterior cervical node. No overlying skin changes. Oropharynx is clear. Uvula is midline. Normal sounding voice. Handling secretions. Neck is supple.  Eyes: Conjunctivae are normal. Right eye exhibits no discharge. Left eye exhibits no discharge.  Neck: Neck supple.  Cardiovascular: Normal rate, regular rhythm and normal heart sounds.  Exam reveals no gallop and no friction rub.   No murmur heard. Pulmonary/Chest: Effort normal and breath sounds normal. No respiratory distress.  Abdominal: Soft. She exhibits no distension. There is no tenderness.  Musculoskeletal: She exhibits no edema or  tenderness.  Neurological: She is alert.  Skin: Skin is warm and dry.  Psychiatric: She has a normal mood and affect. Her behavior is normal. Thought content normal.  Nursing note and vitals reviewed.   ED Course  Procedures (including critical care time) Labs Review Labs Reviewed - No data to display  Imaging Review No results found. I have personally reviewed and evaluated these images and lab results as part of my medical decision-making.   EKG Interpretation None      MDM   Final diagnoses:  Cervical lymphadenopathy    55 year old female who I suspect simply has an enlarged anterior  cervical node. Exam is otherwise unremarkable. No evidence of deep space neck infection or impending airway compromise. At this time, recommend observation of this area. Return precautions were discussed. Even if her symptoms do not worsen, recommending further follow-up if this persists beyond a few more weeks.    Raeford Razor, MD 03/25/16 1420

## 2016-08-03 ENCOUNTER — Institutional Professional Consult (permissible substitution): Payer: BLUE CROSS/BLUE SHIELD | Admitting: Neurology

## 2016-08-12 ENCOUNTER — Other Ambulatory Visit: Payer: Self-pay | Admitting: *Deleted

## 2016-08-12 MED ORDER — NITROGLYCERIN 0.4 MG SL SUBL: 0.4000 mg | SUBLINGUAL_TABLET | SUBLINGUAL | 3 refills | Status: DC | PRN

## 2016-08-19 ENCOUNTER — Institutional Professional Consult (permissible substitution): Payer: BLUE CROSS/BLUE SHIELD | Admitting: Neurology

## 2016-09-03 ENCOUNTER — Telehealth: Payer: Self-pay | Admitting: *Deleted

## 2016-09-03 NOTE — Telephone Encounter (Signed)
Patient c/o chest pressure this morning rated 7/10. Patient said she took nitroglycerin times one and felt relief. Patient said the chest pressure returned just before she called office. Per patient, chest pain rated 5/10, also c/o sob and dizziness when putting head down. Patient c/o gaining 3 lbs in 2 days and swelling in right leg from knee to ankle. Patient advised to take her nitroglycerin 0.4 mg now and reminded that nitroglycerin can be used for severe chest pain, taking one every 5 minutes up to 3 doses, if no relief after 3 rd dose, she is to proceed to the ED. Patient advised that with symptoms she is describing that she needed to go to the ED for an evaluation. Patient verbalized understanding of plan.

## 2016-09-15 ENCOUNTER — Telehealth: Payer: Self-pay | Admitting: Neurology

## 2016-09-15 ENCOUNTER — Institutional Professional Consult (permissible substitution): Payer: BLUE CROSS/BLUE SHIELD | Admitting: Neurology

## 2016-09-15 NOTE — Telephone Encounter (Signed)
Patient called 9:41:08 to advise she can't find her insurance card for today's 11:00 sleep consult appt w/Dr. Frances FurbishAthar, thinks it's [employer] or [employer] something, skyped Angie/Billing and Insurance who advised, we will need the name and address of insurance company along w/policy number otherwise patient will be self pay until we get the information needed. Patient advised. Patient thinks the person who scheduled her appointment told her we have her insurance information as [employer], skyped Linda/Sleep Dept, Bonita QuinLinda will call patient right back. Patient advised. Patient will continue looking for her insurance card in the meantime.

## 2016-10-11 ENCOUNTER — Emergency Department (HOSPITAL_COMMUNITY)
Admission: EM | Admit: 2016-10-11 | Discharge: 2016-10-11 | Disposition: A | Discharge status: Home / Self Care | Payer: BLUE CROSS/BLUE SHIELD | Attending: Emergency Medicine | Admitting: Emergency Medicine

## 2016-10-11 ENCOUNTER — Encounter (HOSPITAL_COMMUNITY): Payer: Self-pay | Admitting: Emergency Medicine

## 2016-10-11 ENCOUNTER — Emergency Department (HOSPITAL_COMMUNITY): Payer: BLUE CROSS/BLUE SHIELD

## 2016-10-11 DIAGNOSIS — Z7982 Long term (current) use of aspirin: Secondary | ICD-10-CM | POA: Insufficient documentation

## 2016-10-11 DIAGNOSIS — I509 Heart failure, unspecified: Secondary | ICD-10-CM | POA: Insufficient documentation

## 2016-10-11 DIAGNOSIS — R110 Nausea: Secondary | ICD-10-CM | POA: Insufficient documentation

## 2016-10-11 DIAGNOSIS — R635 Abnormal weight gain: Secondary | ICD-10-CM | POA: Insufficient documentation

## 2016-10-11 DIAGNOSIS — R059 Cough, unspecified: Secondary | ICD-10-CM

## 2016-10-11 DIAGNOSIS — R05 Cough: Secondary | ICD-10-CM

## 2016-10-11 DIAGNOSIS — R42 Dizziness and giddiness: Secondary | ICD-10-CM | POA: Diagnosis not present

## 2016-10-11 DIAGNOSIS — R609 Edema, unspecified: Secondary | ICD-10-CM | POA: Insufficient documentation

## 2016-10-11 DIAGNOSIS — Z79899 Other long term (current) drug therapy: Secondary | ICD-10-CM | POA: Diagnosis not present

## 2016-10-11 LAB — URINALYSIS, ROUTINE W REFLEX MICROSCOPIC
Bilirubin Urine: NEGATIVE
Glucose, UA: NEGATIVE mg/dL
Hgb urine dipstick: NEGATIVE
Ketones, ur: NEGATIVE mg/dL
LEUKOCYTES UA: NEGATIVE
NITRITE: NEGATIVE
PH: 8 (ref 5.0–8.0)
Protein, ur: NEGATIVE mg/dL
SPECIFIC GRAVITY, URINE: 1.015 (ref 1.005–1.030)

## 2016-10-11 LAB — BRAIN NATRIURETIC PEPTIDE: B NATRIURETIC PEPTIDE 5: 52 pg/mL (ref 0.0–100.0)

## 2016-10-11 LAB — BASIC METABOLIC PANEL
Anion gap: 6 (ref 5–15)
BUN: 13 mg/dL (ref 6–20)
CALCIUM: 9.5 mg/dL (ref 8.9–10.3)
CO2: 28 mmol/L (ref 22–32)
CREATININE: 0.9 mg/dL (ref 0.44–1.00)
Chloride: 105 mmol/L (ref 101–111)
GFR calc non Af Amer: >60 mL/min (ref >60)
Glucose, Bld: 91 mg/dL (ref 65–99)
Potassium: 3.3 mmol/L — ABNORMAL LOW (ref 3.5–5.1)
SODIUM: 139 mmol/L (ref 135–145)

## 2016-10-11 LAB — CBC WITH DIFFERENTIAL/PLATELET
BASOS PCT: 1 %
Basophils Absolute: 0.1 10*3/uL (ref 0.0–0.1)
EOS ABS: 0.2 10*3/uL (ref 0.0–0.7)
EOS PCT: 3 %
HCT: 38.3 % (ref 36.0–46.0)
Hemoglobin: 12.8 g/dL (ref 12.0–15.0)
Lymphocytes Relative: 31 %
Lymphs Abs: 2.4 10*3/uL (ref 0.7–4.0)
MCH: 30.7 pg (ref 26.0–34.0)
MCHC: 33.4 g/dL (ref 30.0–36.0)
MCV: 91.8 fL (ref 78.0–100.0)
MONO ABS: 0.9 10*3/uL (ref 0.1–1.0)
MONOS PCT: 12 %
Neutro Abs: 4.1 10*3/uL (ref 1.7–7.7)
Neutrophils Relative %: 53 %
Platelets: 232 10*3/uL (ref 150–400)
RBC: 4.17 MIL/uL (ref 3.87–5.11)
RDW: 12.9 % (ref 11.5–15.5)
WBC: 7.6 10*3/uL (ref 4.0–10.5)

## 2016-10-11 LAB — TROPONIN I

## 2016-10-11 MED ORDER — POTASSIUM CHLORIDE CRYS ER 20 MEQ PO TBCR
20.0000 meq | EXTENDED_RELEASE_TABLET | Freq: Once | ORAL | Status: AC
  Administered 2016-10-11: 20 meq via ORAL
  Filled 2016-10-11: qty 1

## 2016-10-11 NOTE — ED Provider Notes (Signed)
[facility]-EM[facility]Y DEPT Provider Note   CSN: [id number] Arrival date & time: 10/11/16  1756     History   Chief Complaint Chief Complaint  Patient presents with  . Dizziness    HPI Sara Mcmahon is a 55 y.o. female With a past medical history significant for CHF and Takatsubo's Cardiomyopathy who presents with lower extremity edema And lightheadedness. Patient is accompanied by a friend. Patient reports that the last few days, she has had some swelling in her lower extremities, right greater than left. She reports whenever she has extra fluid on, it is primarily in her right leg. She reports she has had negative ultrasounds look for DVT and says this is similar to prior. She says that she took a fluid pill earlier today and the swelling is improving. She denies any chest pain or shortness of breath but does report having lightheadedness and your syncope earlier today. She denies hitting her head or falling.  She denies any recent fevers, chills, constipation, diarrhea, or dysuria. She does report some mild nausea that is resolved. She says that she's our PCP several days ago and he ordered a chest x-ray to look for edema. Patient denies any headaches, vision changes, or neurologic deficits. Patient does think that she has retained some fluid in her abdomen and legs and think she is several pounds heavier today than she was last week.  Of note, patient does report that she takes her beta blocker twice a day. Patient symptoms did occur after she took her beta blocker this morning. Patient is not tachycardic and not hypertensive on examination.  The history is provided by the patient, a friend and medical records. No language interpreter was used.  Dizziness  Quality:  Lightheadedness Severity:  Moderate Onset quality:  Sudden Duration:  6 hours Timing:  Rare Progression:  Resolved Chronicity:  New Context: standing up   Relieved by:  Lying down Worsened by:  Nothing Ineffective  treatments:  None tried Associated symptoms: nausea   Associated symptoms: no blood in stool, no chest pain, no diarrhea, no headaches, no palpitations, no shortness of breath, no syncope, no vision changes, no vomiting and no weakness   Risk factors: no hx of stroke and no new medications     Past Medical History:  Diagnosis Date  . Cardiomyopathy (HCC)   . CHF (congestive heart failure) (HCC)   . Hypercholesteremia   . Insomnia     There are no active problems to display for this patient.   Past Surgical History:  Procedure Laterality Date  . CARDIAC CATHETERIZATION      OB History    Gravida Para Term Preterm AB Living   2 2 2          SAB TAB Ectopic Multiple Live Births                   Home Medications    Prior to Admission medications   Medication Sig Start Date End Date Taking? Authorizing Provider  ALPRAZolam Prudy Feeler(XANAX) 0.5 MG tablet Take 0.5 mg by mouth at bedtime as needed for anxiety.   Yes Historical Provider, MD  aspirin EC 81 MG tablet Take 81 mg by mouth daily.   Yes Historical Provider, MD  carvedilol (COREG) 3.125 MG tablet Take 1 tablet by mouth 2 (two) times daily. 01/27/16  Yes Historical Provider, MD  cholecalciferol (VITAMIN D) 1000 units tablet Take 2,000 Units by mouth daily.   Yes Historical Provider, MD  eszopiclone (LUNESTA) 2 MG  TABS tablet Take 1 tablet by mouth at bedtime as needed for sleep.  01/10/16  Yes Historical Provider, MD  hydrochlorothiazide (MICROZIDE) 12.5 MG capsule Take 1 tablet daily for the next 3 days 03/25/16  Yes Laqueta Linden, MD  montelukast (SINGULAIR) 10 MG tablet Take 1 tablet by mouth daily. 09/26/16  Yes Historical Provider, MD  Multiple Vitamins-Minerals (MULTIVITAMIN WITH MINERALS) tablet Take 1 tablet by mouth daily.   Yes Historical Provider, MD  nitroGLYCERIN (NITROSTAT) 0.4 MG SL tablet Place 1 tablet (0.4 mg total) under the tongue every 5 (five) minutes as needed for chest pain. 08/12/16  Yes Laqueta Linden,  MD  potassium chloride (K-DUR) 10 MEQ tablet Take 1 tablet (10 mEq total) by mouth daily. 03/04/16  Yes Laqueta Linden, MD  tetrahydrozoline 0.05 % ophthalmic solution Place 1 drop into both eyes 2 (two) times daily.   Yes Historical Provider, MD  TURMERIC PO Take 1 tablet by mouth daily.   Yes Historical Provider, MD    Family History Family History  Problem Relation Age of Onset  . Hypertension Mother   . Cancer Mother   . Heart disease Father   . Hypertension Father     Social History Social History  Substance Use Topics  . Smoking status: Never Smoker  . Smokeless tobacco: Never Used  . Alcohol use 0.0 oz/week     Comment: occas     Allergies   Lasix [furosemide] and Pollen extract   Review of Systems Review of Systems  Constitutional: Positive for unexpected weight change (reported wt gain). Negative for activity change, appetite change, chills, diaphoresis, fatigue and fever.  HENT: Negative for congestion and rhinorrhea.   Eyes: Negative for visual disturbance.  Respiratory: Negative for cough, chest tightness, shortness of breath, wheezing and stridor.   Cardiovascular: Positive for leg swelling (subjective). Negative for chest pain, palpitations and syncope.  Gastrointestinal: Positive for nausea. Negative for abdominal distention, abdominal pain, blood in stool, constipation, diarrhea and vomiting.  Genitourinary: Negative for difficulty urinating, dysuria, flank pain, frequency, hematuria, menstrual problem, pelvic pain, vaginal bleeding and vaginal discharge.  Musculoskeletal: Negative for back pain and neck pain.  Skin: Negative for rash and wound.  Neurological: Positive for light-headedness. Negative for dizziness, syncope, facial asymmetry, speech difficulty, weakness, numbness and headaches.  Psychiatric/Behavioral: Negative for agitation and confusion.  All other systems reviewed and are negative.    Physical Exam Updated Vital Signs BP 130/90    Pulse 63   Temp 97.9 F (36.6 C) (Oral)   Resp 19   Ht 5\' 4"  (1.626 m)   Wt 142 lb (64.4 kg)   SpO2 99%   BMI 24.37 kg/m   Physical Exam  Constitutional: She is oriented to person, place, and time. She appears well-developed and well-nourished. No distress.  HENT:  Head: Normocephalic and atraumatic.  Nose: Nose normal.  Mouth/Throat: Oropharynx is clear and moist. No oropharyngeal exudate.  Eyes: Conjunctivae and EOM are normal. Pupils are equal, round, and reactive to light.  Neck: Normal range of motion. Neck supple.  Cardiovascular: Normal rate and regular rhythm.   No murmur heard. Pulmonary/Chest: Effort normal and breath sounds normal. No respiratory distress. She has no wheezes. She exhibits no tenderness.  Abdominal: Soft. There is no tenderness.  Musculoskeletal: She exhibits no edema or tenderness.       Right lower leg: She exhibits no edema.       Left lower leg: She exhibits no edema.  Neurological: She is  alert and oriented to person, place, and time. She is not disoriented. She displays no tremor and normal reflexes. No cranial nerve deficit or sensory deficit. She exhibits normal muscle tone. She displays no seizure activity. Coordination and gait normal. GCS eye subscore is 4. GCS verbal subscore is 5. GCS motor subscore is 6.  Patient had completely normal neurologic exam including gait, coordination, and Romberg. No vertigo or spinning sensation appreciated.  Skin: Skin is warm and dry. No rash noted.  Psychiatric: She has a normal mood and affect.  Nursing note and vitals reviewed.    ED Treatments / Results  Labs (all labs ordered are listed, but only abnormal results are displayed) Labs Reviewed  BASIC METABOLIC PANEL - Abnormal; Notable for the following:       Result Value   Potassium 3.3 (*)    All other components within normal limits  CBC WITH DIFFERENTIAL/PLATELET  BRAIN NATRIURETIC PEPTIDE  TROPONIN I  URINALYSIS, ROUTINE W REFLEX MICROSCOPIC  (NOT AT [facility])    EKG  EKG Interpretation  Date/Time:  Sunday October 11 2016 18:17:53 EST Ventricular Rate:  72 PR Interval:  198 QRS Duration: 86 QT Interval:  392 QTC Calculation: 429 R Axis:   67 Text Interpretation:  Normal sinus rhythm Normal ECG Confirmed by DELO  MD, DOUGLAS ([id number]) on 10/11/2016 7:28:34 PM       Radiology Dg Chest 2 View  Result Date: 10/11/2016 CLINICAL DATA:  Dizziness and leg swelling with weight gain for the past 5 days. EXAM: CHEST  2 VIEW COMPARISON:  01/08/2016 FINDINGS: The cardiac silhouette, mediastinal and hilar contours are within normal limits and stable. The lungs are clear. No pleural effusions. The bony thorax is intact. IMPRESSION: No acute cardiopulmonary findings. Electronically Signed   By: Rudie Meyer M.D.   On: 10/11/2016 19:39    Procedures Procedures (including critical care time)  Medications Ordered in ED Medications  potassium chloride SA (K-DUR,KLOR-CON) CR tablet 20 mEq (20 mEq Oral Given 10/11/16 2109)     Initial Impression / Assessment and Plan / ED Course  I have reviewed the triage vital signs and the nursing notes.  Pertinent labs & imaging results that were available during my care of the patient were reviewed by me and considered in my medical decision making (see chart for details).  Clinical Course     MATALIE ROMBERGER is a 55 y.o. female With a past medical history significant for CHF and Takatsubo's Cardiomyopathy who presents with lower extremity edema And lightheadedness.  History and exam are seen above.  On exam, patient has no lower extremity edema. Patient reports that she thinks it has resolved after her taking a "fluid pill." Patient's lungs were clear with no evidence of rhonchi or rales. Abdomen was nontender and chest was nontender.   Given the patient's history of heart failure and report of subjective edema, patient had workup to look for CHF exacerbation or electrolyte  abnormalities.  Workup results are seen above. BNP nonelevated, troponin negative. Urinalysis showed no evidence of infection. Potassium slightly decreased at 3.3. CBC shows no leukocytosis or anemia. Chest x-ray showed no edema or pneumonia. EKG unremarkable.  Workup negative for infection or CHF exacerbation. As patient had her lightheadedness following beta blocker use, suspect this may be the cause. Patient reports she was due to take her beta blocker while in the ED however, her blood pressure was 120 systolic and Hari was in the 60s. The patient to beta blocker at this  time, suspect patient might feel lightheaded. Patient instructed to hold her beta blocker until seen by her PCP in several days. Doubt DVT based on no evidence of unilateral edema and patient's report of negative ultrasounds in the past.  Patient instructed to follow up with PCP and her cardiologist. Patient given strict return precautions. Patient had no other questions or concerns and was discharged in good condition.     Final Clinical Impressions(s) / ED Diagnoses   Final diagnoses:  Lightheaded  Edema, unspecified type  Cough    New Prescriptions Discharge Medication List as of 10/11/2016  9:17 PM      Clinical Impression: 1. Lightheaded   2. Edema, unspecified type   3. Cough     Disposition: Discharge  Condition: Good  I have discussed the results, Dx and Tx plan with the pt(& family if present). He/she/they expressed understanding and agree(s) with the plan. Discharge instructions discussed at great length. Strict return precautions discussed and pt &/or family have verbalized understanding of the instructions. No further questions at time of discharge.    Discharge Medication List as of 10/11/2016  9:17 PM      Follow Up: Gwenyth BenderEric L Dean, MD [address] [place] [address] [phone]     [facility] [address] [id number] [address] [place] [id number] [phone]  If symptoms worsen     Heide Scaleshristopher J Clarkson Rosselli, MD 10/12/16 1105

## 2016-10-11 NOTE — ED Triage Notes (Signed)
Pt reports dizziness, abdominal and R leg swelling. Pt states she has had swelling for approx 3 weeks. Pt reports weight gain within the past 5 days. Pt has damage to her heart and CHF per her report. Pt has order from PCP for CXR. Saw PCP on Monday.

## 2016-10-11 NOTE — Discharge Instructions (Signed)
Please follow-up with your PCP for further management of your symptoms. If any new symptoms return or arise, please return to the nearest emergency department.

## 2016-10-11 NOTE — ED Notes (Signed)
Pt states understanding of care given and follow up instructions.  Pt ambulated from ED with female.

## 2016-10-26 ENCOUNTER — Other Ambulatory Visit: Payer: Self-pay | Admitting: Cardiovascular Disease

## 2016-10-27 ENCOUNTER — Other Ambulatory Visit: Payer: Self-pay

## 2016-10-27 ENCOUNTER — Telehealth: Payer: Self-pay | Admitting: Cardiovascular Disease

## 2016-10-27 MED ORDER — CARVEDILOL 3.125 MG PO TABS: 3.1250 mg | ORAL_TABLET | Freq: Two times a day (BID) | ORAL | 6 refills | Status: DC

## 2016-10-27 NOTE — Telephone Encounter (Signed)
Called pt. Informed her that she does not have to find a new cardiologist and her refills will come form Dr. Purvis SheffieldKoneswaran as long as she sees him regularly. I am not sure where that note to pharmacy came from, but I did take the old prescription out, so that it will not be on her future refills. She understood. I apologized to pt. Will do some investigating as to how that got there.

## 2016-10-27 NOTE — Telephone Encounter (Signed)
carvedilol (COREG) 3.125 MG tablet 60 tablet 3 10/26/2016    Sig: take 1 tablet by mouth twice a day with food   Notes to Pharmacy: NOTIFY PT: Future refills to come from PCP or NEW Cardiologist   E-Prescribing Status: Receipt confirmed by pharmacy (10/26/2016 9:59 AM EST)    Patient does not understand why this was put on her bottle. She stated last time she saw Dr Purvis SheffieldKoneswaran she was told to come back in a year.  There is a recall is in April.

## 2016-11-03 ENCOUNTER — Institutional Professional Consult (permissible substitution): Payer: PRIVATE HEALTH INSURANCE | Admitting: Neurology

## 2016-12-22 ENCOUNTER — Institutional Professional Consult (permissible substitution): Payer: PRIVATE HEALTH INSURANCE | Admitting: Neurology

## 2017-01-22 ENCOUNTER — Other Ambulatory Visit: Payer: Self-pay | Admitting: Cardiovascular Disease

## 2017-01-22 ENCOUNTER — Other Ambulatory Visit: Payer: Self-pay

## 2017-01-22 MED ORDER — CARVEDILOL 3.125 MG PO TABS: 3.1250 mg | ORAL_TABLET | Freq: Two times a day (BID) | ORAL | 3 refills | Status: DC

## 2017-01-22 NOTE — Telephone Encounter (Signed)
Done

## 2017-01-22 NOTE — Telephone Encounter (Signed)
Refill:   Patient is requesting to have refill on carvedilol (COREG) 3.125 MG tablet

## 2017-02-08 ENCOUNTER — Emergency Department (HOSPITAL_COMMUNITY): Payer: BLUE CROSS/BLUE SHIELD

## 2017-02-08 ENCOUNTER — Encounter (HOSPITAL_COMMUNITY): Payer: Self-pay

## 2017-02-08 DIAGNOSIS — R079 Chest pain, unspecified: Secondary | ICD-10-CM | POA: Diagnosis present

## 2017-02-08 DIAGNOSIS — I509 Heart failure, unspecified: Secondary | ICD-10-CM | POA: Diagnosis not present

## 2017-02-08 DIAGNOSIS — Z5321 Procedure and treatment not carried out due to patient leaving prior to being seen by health care provider: Secondary | ICD-10-CM | POA: Insufficient documentation

## 2017-02-08 DIAGNOSIS — Z7982 Long term (current) use of aspirin: Secondary | ICD-10-CM | POA: Insufficient documentation

## 2017-02-08 NOTE — ED Triage Notes (Signed)
Patient states that she has been experiencing dizziness, nausea, and reflux for the past week.  Chest pain on the left side that is burning into the center of my chest.

## 2017-02-09 ENCOUNTER — Emergency Department (HOSPITAL_COMMUNITY)
Admission: EM | Admit: 2017-02-09 | Discharge: 2017-02-09 | Disposition: A | Discharge status: Home / Self Care | Payer: BLUE CROSS/BLUE SHIELD | Attending: Dermatology | Admitting: Dermatology

## 2017-02-09 LAB — CBC
HEMATOCRIT: 36.3 % (ref 36.0–46.0)
Hemoglobin: 12.5 g/dL (ref 12.0–15.0)
MCH: 31.2 pg (ref 26.0–34.0)
MCHC: 34.4 g/dL (ref 30.0–36.0)
MCV: 90.5 fL (ref 78.0–100.0)
Platelets: 274 10*3/uL (ref 150–400)
RBC: 4.01 MIL/uL (ref 3.87–5.11)
RDW: 12.9 % (ref 11.5–15.5)
WBC: 6.9 10*3/uL (ref 4.0–10.5)

## 2017-02-09 LAB — BASIC METABOLIC PANEL
Anion gap: 6 (ref 5–15)
BUN: 14 mg/dL (ref 6–20)
CHLORIDE: 104 mmol/L (ref 101–111)
CO2: 29 mmol/L (ref 22–32)
CREATININE: 0.68 mg/dL (ref 0.44–1.00)
Calcium: 9.4 mg/dL (ref 8.9–10.3)
GFR calc Af Amer: >60 mL/min (ref >60)
GFR calc non Af Amer: >60 mL/min (ref >60)
GLUCOSE: 100 mg/dL — AB (ref 65–99)
POTASSIUM: 3.3 mmol/L — AB (ref 3.5–5.1)
Sodium: 139 mmol/L (ref 135–145)

## 2017-02-09 LAB — TROPONIN I: Troponin I: <0.03 ng/mL (ref <0.03)

## 2017-02-09 NOTE — ED Notes (Signed)
Called with no answer 

## 2017-02-09 NOTE — ED Notes (Signed)
Pt has actually been  Called 3 times. Pt not in the waiting area

## 2017-02-09 NOTE — ED Notes (Signed)
[facility]

## 2017-02-09 NOTE — ED Provider Notes (Signed)
EKG Interpretation  Date/Time:  Monday February 08 2017 22:30:42 EST Ventricular Rate:  68 PR Interval:  204 QRS Duration: 80 QT Interval:  396 QTC Calculation: 421 R Axis:   74 Text Interpretation:  Normal sinus rhythm Normal ECG No significant change since last tracing EARLIER SAME DATE Confirmed by Brylan Dec  MD-I, Marsel Gail ([id number]) on 02/09/2017 1:47:05 AM      Devoria AlbeIva Luca Burston, MD, Concha PyoFACEP    Marty Uy, MD 02/09/17 [phone]

## 2017-02-22 ENCOUNTER — Other Ambulatory Visit: Payer: Self-pay | Admitting: Cardiovascular Disease

## 2017-03-11 ENCOUNTER — Ambulatory Visit (INDEPENDENT_AMBULATORY_CARE_PROVIDER_SITE_OTHER): Payer: BLUE CROSS/BLUE SHIELD | Admitting: Cardiovascular Disease

## 2017-03-11 ENCOUNTER — Encounter: Payer: Self-pay | Admitting: Cardiovascular Disease

## 2017-03-11 VITALS — BP 110/82 | HR 72 | Ht 64.0 in | Wt 141.0 lb

## 2017-03-11 DIAGNOSIS — R002 Palpitations: Secondary | ICD-10-CM

## 2017-03-11 DIAGNOSIS — E876 Hypokalemia: Secondary | ICD-10-CM | POA: Diagnosis not present

## 2017-03-11 DIAGNOSIS — I5181 Takotsubo syndrome: Secondary | ICD-10-CM

## 2017-03-11 DIAGNOSIS — Z9289 Personal history of other medical treatment: Secondary | ICD-10-CM

## 2017-03-11 DIAGNOSIS — I471 Supraventricular tachycardia: Secondary | ICD-10-CM

## 2017-03-11 NOTE — Progress Notes (Signed)
SUBJECTIVE: The patient presents for follow-up of Takatsubo cardiomyopathy and PSVT. Echocardiogram 02/19/16: Normal left ventricular systolic function and regional wall motion, LVEF 55-60%, mild LVH, grade 1 diastolic dysfunction.  She was evaluated in the ED on 02/08/17 for chest pain. Troponin was normal. Potassium was low at 3.3. CBC was normal. Chest x-ray showed no acute cardiopulmonary process.  ECG which I personally interpreted demonstrated sinus rhythm with some mild nonspecific T wave abnormalities.  With regards to hypokalemia, she is on supplementation and this is reportedly monitored by her PCP  With regards to palpitations, she primarily experiences them at night 2-3 times per week after she has been working hard.  She takes hydrochlorothiazide 12.5 mg once per week for right leg swelling.  She notices she gets fatigued more easily when working excessively around the house.    Review of Systems: As per "subjective", otherwise negative.  Allergies  Allergen Reactions  . Lasix [Furosemide] Other (See Comments)    Headache, dizziness & throat irritation  . Pollen Extract     Trees, grass, flowers    Current Outpatient Prescriptions  Medication Sig Dispense Refill  . ALPRAZolam (XANAX) 0.5 MG tablet Take 0.5 mg by mouth at bedtime as needed for anxiety.    . carvedilol (COREG) 3.125 MG tablet Take 1 tablet (3.125 mg total) by mouth 2 (two) times daily. 60 tablet 3  . cholecalciferol (VITAMIN D) 1000 units tablet Take 2,000 Units by mouth daily.    . eszopiclone (LUNESTA) 2 MG TABS tablet Take 1 tablet by mouth at bedtime as needed for sleep.   0  . hydrochlorothiazide (MICROZIDE) 12.5 MG capsule Take 12.5 mg by mouth once a week.    . montelukast (SINGULAIR) 10 MG tablet Take 1 tablet by mouth daily.  0  . Multiple Vitamins-Minerals (MULTIVITAMIN WITH MINERALS) tablet Take 1 tablet by mouth daily.    . nitroGLYCERIN (NITROSTAT) 0.4 MG SL tablet Place 1 tablet  (0.4 mg total) under the tongue every 5 (five) minutes as needed for chest pain. 25 tablet 3  . potassium chloride (K-DUR) 10 MEQ tablet take 1 tablet by mouth once daily 30 tablet 0  . TURMERIC PO Take 1 tablet by mouth daily.     No current facility-administered medications for this visit.     Past Medical History:  Diagnosis Date  . Cardiomyopathy (HCC)   . CHF (congestive heart failure) (HCC)   . Hypercholesteremia   . Insomnia     Past Surgical History:  Procedure Laterality Date  . CARDIAC CATHETERIZATION      Social History   Social History  . Marital status: Widowed    Spouse name: N/A  . Number of children: N/A  . Years of education: N/A   Occupational History  . Not on file.   Social History Main Topics  . Smoking status: Never Smoker  . Smokeless tobacco: Never Used  . Alcohol use 0.0 oz/week     Comment: occas  . Drug use: No  . Sexual activity: Not on file   Other Topics Concern  . Not on file   Social History Narrative  . No narrative on file     Vitals:   03/11/17 1345  BP: 110/82  Pulse: 72  SpO2: 97%  Weight: 141 lb (64 kg)  Height:  (1.626 m)    Wt Readings from Last 3 Encounters:  03/11/17 141 lb (64 kg)  02/08/17 140 lb (63.5 kg)  10/11/16 142  lb (64.4 kg)     PHYSICAL EXAM General: NAD HEENT: Normal. Neck: No JVD, no thyromegaly. Lungs: Clear to auscultation bilaterally with normal respiratory effort. CV: Nondisplaced PMI.  Regular rate and rhythm, normal S1/S2, no S3/S4, no murmur. No pretibial or periankle edema.  No carotid bruit.   Abdomen: Soft, nontender, no distention.  Neurologic: Alert and oriented.  Psych: Normal affect. Skin: Normal. Musculoskeletal: No gross deformities.    ECG: Most recent ECG reviewed.   Labs: Lab Results  Component Value Date/Time   K 3.3 (L) 02/08/2017 11:21 PM   BUN 14 02/08/2017 11:21 PM   CREATININE 0.68 02/08/2017 11:21 PM   HGB 12.5 02/08/2017 11:21 PM      Lipids: No results found for: LDLCALC, LDLDIRECT, CHOL, TRIG, HDL     ASSESSMENT AND PLAN: 1. Takotsubo cardiomyopathy: She has had normalization of left ventricular systolic function. Continue Coreg.  2. PSVT: She has palpitations 2-3 times per week. I offered event monitoring but she declined. Continue Coreg.  3. Hypokalemia: 3.3 in the ED. This has reportedly subsequently been tested and is followed by her PCP. She is on potassium supplementation. To avoid palpitations, I would try to keep potassium level greater than 4.   Disposition: Follow up 1 year  Prentice Docker, M.D., F.A.C.C.

## 2017-03-11 NOTE — Patient Instructions (Signed)

## 2017-03-25 ENCOUNTER — Other Ambulatory Visit: Payer: Self-pay | Admitting: Cardiovascular Disease

## 2017-05-13 ENCOUNTER — Encounter (HOSPITAL_COMMUNITY): Payer: Self-pay

## 2017-05-13 DIAGNOSIS — R42 Dizziness and giddiness: Secondary | ICD-10-CM | POA: Diagnosis present

## 2017-05-13 DIAGNOSIS — R110 Nausea: Secondary | ICD-10-CM | POA: Insufficient documentation

## 2017-05-13 DIAGNOSIS — R55 Syncope and collapse: Secondary | ICD-10-CM | POA: Diagnosis not present

## 2017-05-13 DIAGNOSIS — I509 Heart failure, unspecified: Secondary | ICD-10-CM | POA: Insufficient documentation

## 2017-05-13 NOTE — ED Triage Notes (Signed)
Pt with multiple complaints, states she had a sudden onset of abd cramping, nausea, clamminess, dizziness, generalized weakness, states she feels like she might be going back into chf again, states is concerned about her blood pressure being a little higher than normal, but is very anxious about same.

## 2017-05-14 ENCOUNTER — Emergency Department (HOSPITAL_COMMUNITY)
Admission: EM | Admit: 2017-05-14 | Discharge: 2017-05-14 | Disposition: A | Discharge status: Home / Self Care | Payer: BLUE CROSS/BLUE SHIELD | Attending: Emergency Medicine | Admitting: Emergency Medicine

## 2017-05-14 DIAGNOSIS — R55 Syncope and collapse: Secondary | ICD-10-CM

## 2017-05-14 DIAGNOSIS — R110 Nausea: Secondary | ICD-10-CM

## 2017-05-14 LAB — COMPREHENSIVE METABOLIC PANEL
ALT: 19 U/L (ref 14–54)
ANION GAP: 5 (ref 5–15)
AST: 27 U/L (ref 15–41)
Albumin: 4 g/dL (ref 3.5–5.0)
Alkaline Phosphatase: 55 U/L (ref 38–126)
BILIRUBIN TOTAL: 0.4 mg/dL (ref 0.3–1.2)
BUN: 16 mg/dL (ref 6–20)
CALCIUM: 9.4 mg/dL (ref 8.9–10.3)
CO2: 28 mmol/L (ref 22–32)
Chloride: 104 mmol/L (ref 101–111)
Creatinine, Ser: 0.79 mg/dL (ref 0.44–1.00)
Glucose, Bld: 123 mg/dL — ABNORMAL HIGH (ref 65–99)
POTASSIUM: 3.4 mmol/L — AB (ref 3.5–5.1)
Sodium: 137 mmol/L (ref 135–145)
TOTAL PROTEIN: 7.1 g/dL (ref 6.5–8.1)

## 2017-05-14 LAB — CBC WITH DIFFERENTIAL/PLATELET
Basophils Absolute: 0 10*3/uL (ref 0.0–0.1)
Basophils Relative: 0 %
EOS ABS: 0.1 10*3/uL (ref 0.0–0.7)
EOS PCT: 1 %
HCT: 37.6 % (ref 36.0–46.0)
Hemoglobin: 12.8 g/dL (ref 12.0–15.0)
LYMPHS ABS: 1.6 10*3/uL (ref 0.7–4.0)
LYMPHS PCT: 11 %
MCH: 31 pg (ref 26.0–34.0)
MCHC: 34 g/dL (ref 30.0–36.0)
MCV: 91 fL (ref 78.0–100.0)
MONO ABS: 1 10*3/uL (ref 0.1–1.0)
Monocytes Relative: 7 %
Neutro Abs: 11.5 10*3/uL — ABNORMAL HIGH (ref 1.7–7.7)
Neutrophils Relative %: 81 %
PLATELETS: 236 10*3/uL (ref 150–400)
RBC: 4.13 MIL/uL (ref 3.87–5.11)
RDW: 12.9 % (ref 11.5–15.5)
WBC: 14.3 10*3/uL — AB (ref 4.0–10.5)

## 2017-05-14 LAB — TROPONIN I

## 2017-05-14 LAB — URINALYSIS, ROUTINE W REFLEX MICROSCOPIC
BACTERIA UA: NONE SEEN
BILIRUBIN URINE: NEGATIVE
Glucose, UA: NEGATIVE mg/dL
Hgb urine dipstick: NEGATIVE
KETONES UR: NEGATIVE mg/dL
NITRITE: NEGATIVE
Protein, ur: NEGATIVE mg/dL
Specific Gravity, Urine: 1.01 (ref 1.005–1.030)
pH: 7 (ref 5.0–8.0)

## 2017-05-14 LAB — BRAIN NATRIURETIC PEPTIDE: B Natriuretic Peptide: 25 pg/mL (ref 0.0–100.0)

## 2017-05-14 LAB — LIPASE, BLOOD: LIPASE: 29 U/L (ref 11–51)

## 2017-05-14 MED ORDER — ONDANSETRON HCL 4 MG/2ML IJ SOLN
4.0000 mg | Freq: Once | INTRAMUSCULAR | Status: DC
  Filled 2017-05-14: qty 2

## 2017-05-14 MED ORDER — ONDANSETRON 4 MG PO TBDP: 4.0000 mg | ORAL_TABLET | Freq: Three times a day (TID) | ORAL | 0 refills | Status: DC | PRN

## 2017-05-14 NOTE — ED Notes (Signed)
Pt given water to drink per fluid challenge 

## 2017-05-14 NOTE — ED Provider Notes (Signed)
[facility] DEPT Provider Note   CSN: [id number] Arrival date & time: 05/13/17  2349     History   Chief Complaint Chief Complaint  Patient presents with  . Weakness  . Dizziness    HPI Sara Mcmahon is a 56 y.o. female.  Patient with history of tach assumed his cardiomyopathy in 2016 presenting with sudden onset nausea associated with abdominal cramping, lightheadedness, generalized weakness, left arm tingling and dizziness. She states this happened suddenly while she was lying in bed. She attempted to go to the bathroom to throw up but was unable to. No diarrhea. No chest pain. Developed tingling in her left arm that lasted a couple minutes and is now resolved. No weakness. No difficulty speaking or swallowing. She checked her blood pressure and found it was elevated to 140/90 which she states is high for her she became concerned. She's had these episodes in the past with nausea but never had elevated blood pressure and arm tingling. Her nausea has since resolved after taking Phenergan at home. Denies any diarrhea. Denies any chest pain, cough or fever. Denies any sick contacts at home.   The history is provided by the patient.  Weakness  Primary symptoms include dizziness. Pertinent negatives include no shortness of breath, no chest pain and no vomiting.  Dizziness  Associated symptoms: nausea and weakness   Associated symptoms: no chest pain, no diarrhea, no shortness of breath and no vomiting     Past Medical History:  Diagnosis Date  . Cardiomyopathy (HCC)   . CHF (congestive heart failure) (HCC)   . Hypercholesteremia   . Insomnia     There are no active problems to display for this patient.   Past Surgical History:  Procedure Laterality Date  . CARDIAC CATHETERIZATION      OB History    Gravida Para Term Preterm AB Living   2 2 2          SAB TAB Ectopic Multiple Live Births                   Home Medications    Prior to Admission medications     Medication Sig Start Date End Date Taking? Authorizing Provider  ALPRAZolam Prudy Feeler(XANAX) 0.5 MG tablet Take 0.5 mg by mouth at bedtime as needed for anxiety.    [provider]  carvedilol (COREG) 3.125 MG tablet Take 1 tablet (3.125 mg total) by mouth 2 (two) times daily. 01/22/17 04/22/17  Laqueta LindenKoneswaran, Suresh A, MD  cholecalciferol (VITAMIN D) 1000 units tablet Take 2,000 Units by mouth daily.    [provider]  eszopiclone (LUNESTA) 2 MG TABS tablet Take 1 tablet by mouth at bedtime as needed for sleep.  01/10/16   [provider]  hydrochlorothiazide (MICROZIDE) 12.5 MG capsule Take 12.5 mg by mouth once a week.    [provider]  montelukast (SINGULAIR) 10 MG tablet Take 1 tablet by mouth daily. 09/26/16   [provider]  Multiple Vitamins-Minerals (MULTIVITAMIN WITH MINERALS) tablet Take 1 tablet by mouth daily.    [provider]  nitroGLYCERIN (NITROSTAT) 0.4 MG SL tablet Place 1 tablet (0.4 mg total) under the tongue every 5 (five) minutes as needed for chest pain. 08/12/16   Laqueta LindenKoneswaran, Suresh A, MD  potassium chloride (K-DUR) 10 MEQ tablet take 1 tablet by mouth once daily 03/25/17   Laqueta LindenKoneswaran, Suresh A, MD  TURMERIC PO Take 1 tablet by mouth daily.    [provider]    [facility]  [facility] Family History  Problem Relation Age of Onset  . Hypertension Mother   . Cancer Mother   . Heart disease Father   . Hypertension Father     Social History Social History  Substance Use Topics  . Smoking status: Never Smoker  . Smokeless tobacco: Never Used  . Alcohol use 0.0 oz/week     Comment: occas     Allergies   Lasix [furosemide] and Pollen extract   Review of Systems Review of Systems  Constitutional: Positive for fatigue. Negative for activity change, appetite change and fever.  HENT: Negative for congestion and rhinorrhea.   Respiratory: Negative for cough, chest tightness and shortness of breath.   Cardiovascular:  Negative for chest pain.  Gastrointestinal: Positive for nausea. Negative for abdominal pain, diarrhea and vomiting.  Genitourinary: Negative for dysuria and hematuria.  Musculoskeletal: Negative for arthralgias and myalgias.  Neurological: Positive for dizziness, weakness, light-headedness and numbness.    all other systems are negative except as noted in the HPI and PMH.    Physical Exam Updated Vital Signs BP (!) 158/81 (BP Location: Right Arm)   Pulse 65   Temp 98.2 F (36.8 C) (Oral)   Resp 18   Ht 5\' 4"  (1.626 m)   Wt 65.3 kg (144 lb)   SpO2 100%   BMI 24.72 kg/m   Physical Exam  Constitutional: She is oriented to person, place, and time. She appears well-developed and well-nourished. No distress.  HENT:  Head: Normocephalic and atraumatic.  Mouth/Throat: Oropharynx is clear and moist. No oropharyngeal exudate.  Eyes: Conjunctivae and EOM are normal. Pupils are equal, round, and reactive to light.  Neck: Normal range of motion. Neck supple.  No meningismus.  Cardiovascular: Normal rate, regular rhythm, normal heart sounds and intact distal pulses.   No murmur heard. Pulmonary/Chest: Effort normal and breath sounds normal. No respiratory distress.  Abdominal: Soft. There is no tenderness. There is no rebound and no guarding.  Musculoskeletal: Normal range of motion. She exhibits no edema or tenderness.  Neurological: She is alert and oriented to person, place, and time. No cranial nerve deficit. She exhibits normal muscle tone. Coordination normal.   5/5 strength throughout. CN 2-12 intact.Equal grip strength.  No ataxia on finger to nose. No pronator drift.  Sensation intact  Skin: Skin is warm. Capillary refill takes less than 2 seconds.  Psychiatric: She has a normal mood and affect. Her behavior is normal.  Nursing note and vitals reviewed.    ED Treatments / Results  Labs (all labs ordered are listed, but only abnormal results are displayed) Labs Reviewed    URINALYSIS, ROUTINE W REFLEX MICROSCOPIC - Abnormal; Notable for the following:       Result Value   Color, Urine STRAW (*)    Leukocytes, UA TRACE (*)    Squamous Epithelial / LPF 0-5 (*)    All other components within normal limits  CBC WITH DIFFERENTIAL/PLATELET - Abnormal; Notable for the following:    WBC 14.3 (*)    Neutro Abs 11.5 (*)    All other components within normal limits  COMPREHENSIVE METABOLIC PANEL - Abnormal; Notable for the following:    Potassium 3.4 (*)    Glucose, Bld 123 (*)    All other components within normal limits  TROPONIN I  BRAIN NATRIURETIC PEPTIDE  LIPASE, BLOOD  TROPONIN I    EKG  EKG Interpretation None       Radiology No results found.  Procedures Procedures (including critical  care time)  Medications Ordered in ED Medications - No data to display   Initial Impression / Assessment and Plan / ED Course  I have reviewed the triage vital signs and the nursing notes.  Pertinent labs & imaging results that were available during my care of the patient were reviewed by me and considered in my medical decision making (see chart for details).     Patient was sudden onset nausea, lightheadedness, near syncope and left arm tingling. No chest pain. EKG shows sinus rhythm. Cardiac catheterization 2016 showed clean coronaries. EF did recover based on echocardiogram in 2017.  EKG normal sinus rhythm. No evidence of Brugada. No prolonged QT. Patient given IV fluids in the ED. Labs are reassuring.  UA negative. Troponin negative x2.  No episodes of chest pain.  She is tolerating by mouth and ambulatory. No chest pain or shortness of breath.   Episode of nausea with near syncope.  Doubt ACS, doubt PE. Brief L arm tingling has resolved after a few minutes. Doubt CVA. No neuro deficits on exam.  She feels improved.  Has had recurrent nausea episodes in the past that her PCP is aware of.  She appears stable for outpatient followup. Return  precautions discussed.  BP 107/67   Pulse 76   Temp 98.2 F (36.8 C) (Oral)   Resp 18   Ht 5\' 4"  (1.626 m)   Wt 65.3 kg (144 lb)   SpO2 98%   BMI 24.72 kg/m     Final Clinical Impressions(s) / ED Diagnoses   Final diagnoses:  Near syncope  Nausea    New Prescriptions New Prescriptions   No medications on file     Glynn Octave, MD 05/14/17 2248

## 2017-05-14 NOTE — Discharge Instructions (Signed)
There is no evidence of heart attack or stroke. Keep yourself hydrated. Followup with your doctor. Return to the ED if you develop new or worsening symptoms. °

## 2017-05-14 NOTE — ED Notes (Signed)
Ambulated pt around nurses station. Pt had a steady gait and no complaints during ambulation

## 2017-09-10 IMAGING — DX DG CHEST 2V
2 series · 2 of 2 positions shown · non-contrast
Comparison: Chest radiograph October 11, 2016

CLINICAL DATA: Burning chest pain, nausea and dizziness for 4 days.
History of CHF.

EXAM:
CHEST  2 VIEW

[chest pa]
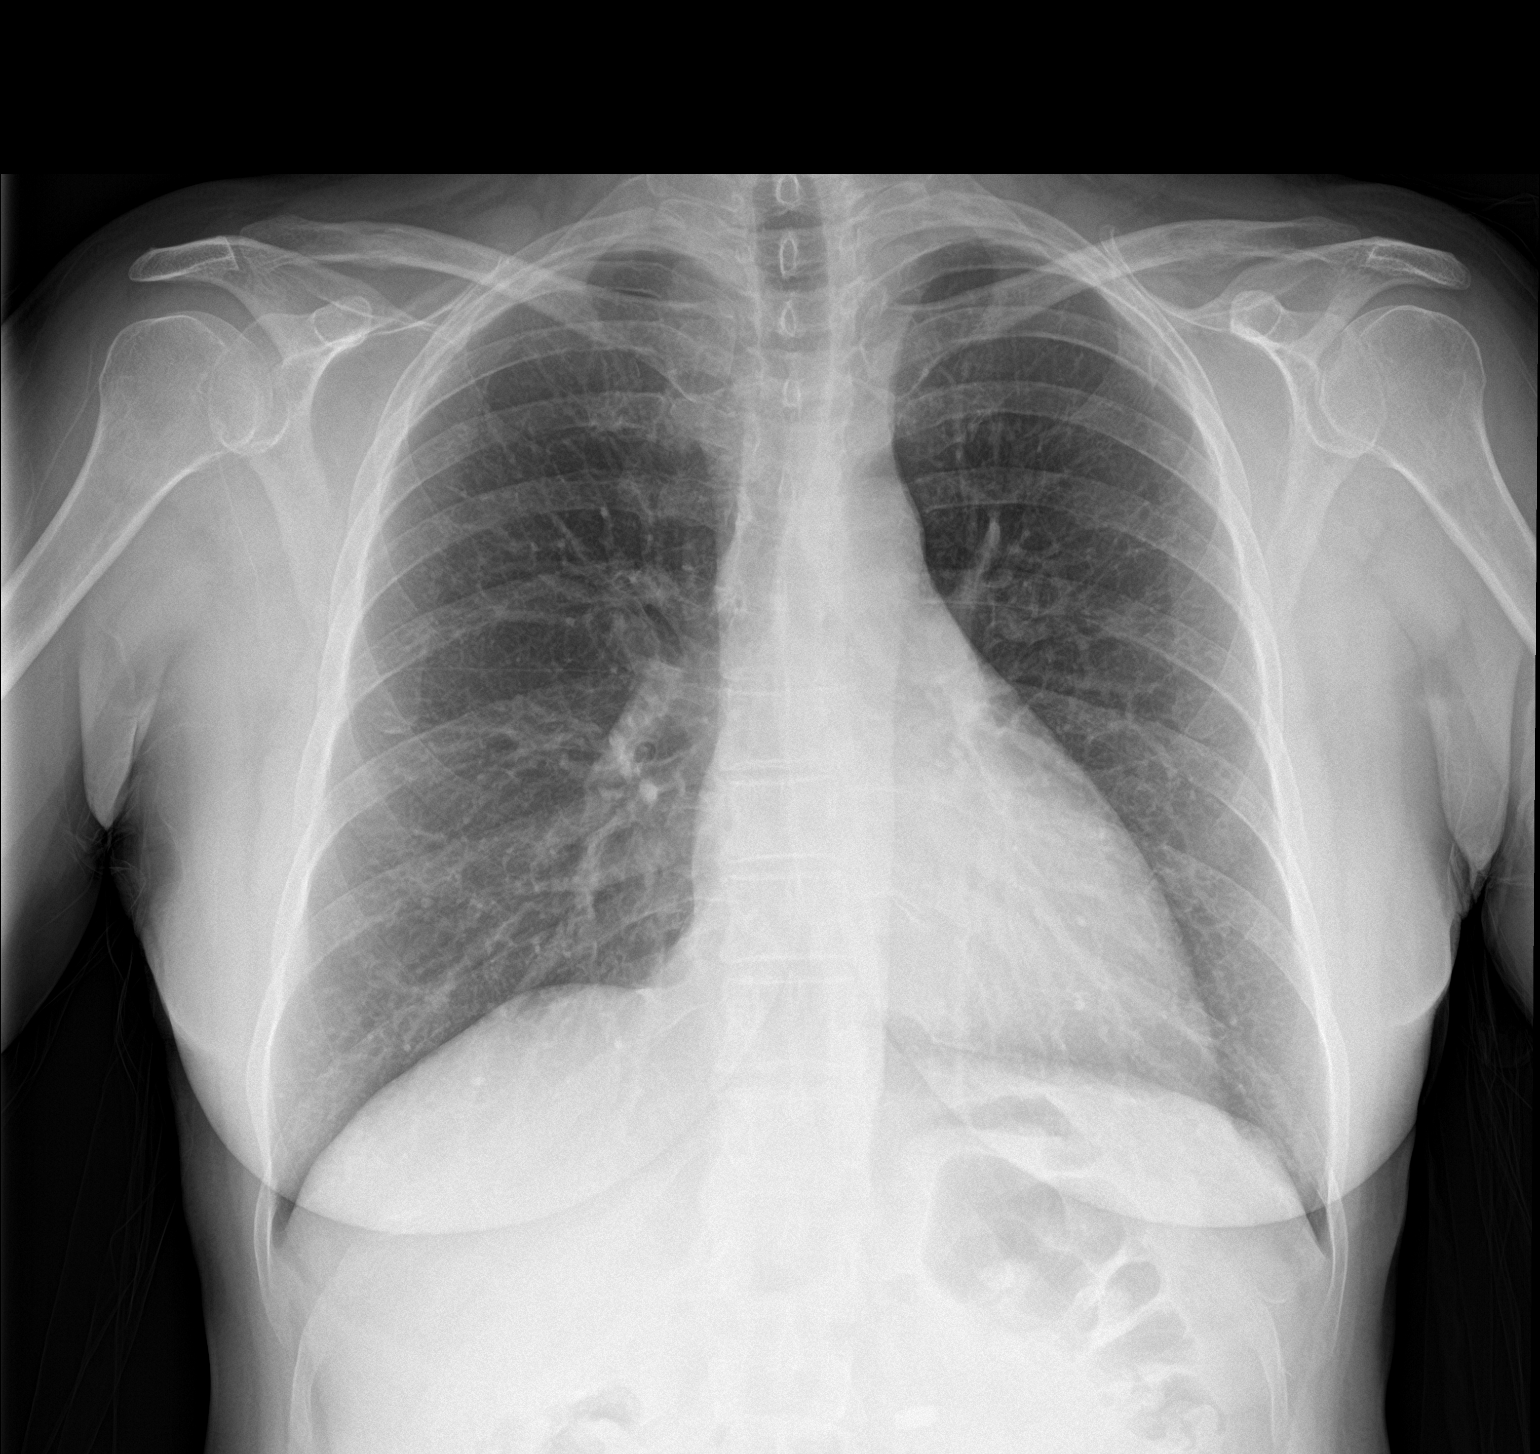

[chest lat]
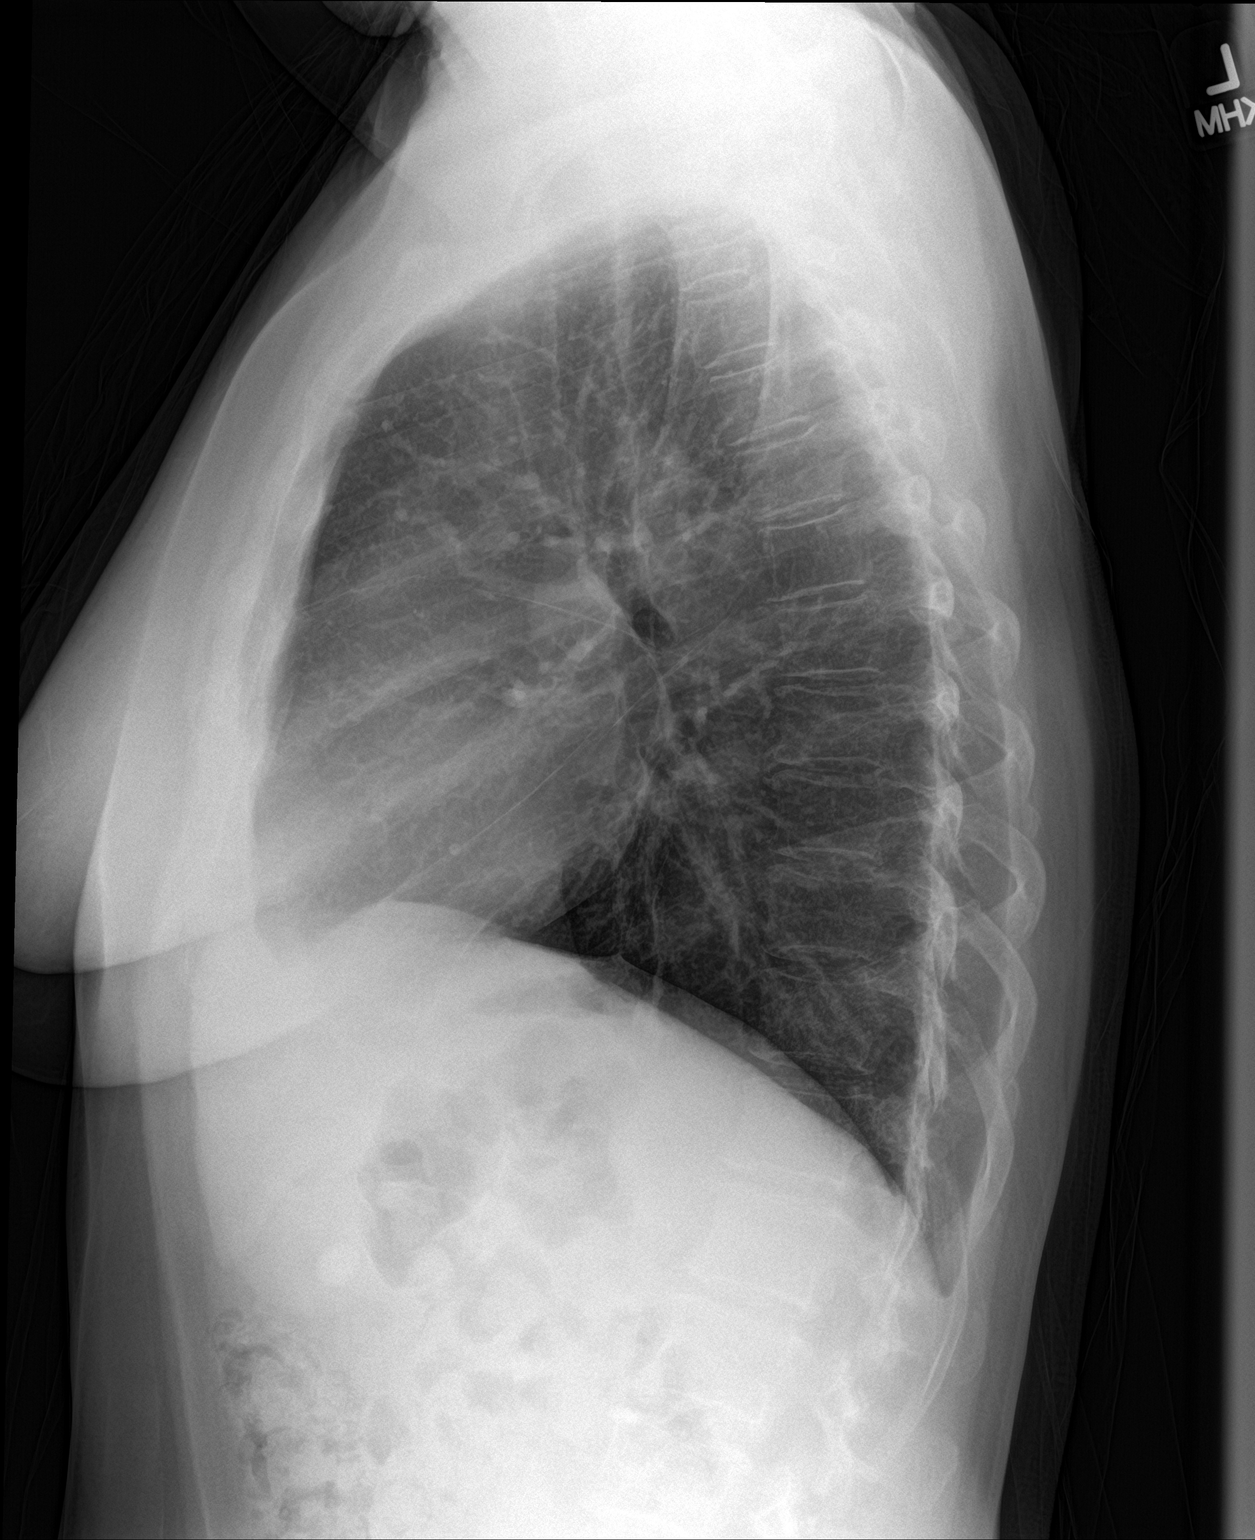

[2 of 2 positions shown; findings below may reference images not displayed]

FINDINGS: Cardiomediastinal silhouette is normal. No pleural effusions or
focal consolidations. Trachea projects midline and there is no
pneumothorax. Soft tissue planes and included osseous structures are
non-suspicious. Faint calcifications LEFT neck are likely vascular.
IMPRESSION: Stable examination:  No acute cardiopulmonary process.

## 2017-09-21 ENCOUNTER — Ambulatory Visit (INDEPENDENT_AMBULATORY_CARE_PROVIDER_SITE_OTHER): Payer: Medicare HMO | Admitting: Orthopaedic Surgery

## 2017-09-21 ENCOUNTER — Encounter: Payer: Self-pay | Admitting: Orthopaedic Surgery

## 2017-09-21 VITALS — BP 108/68 | HR 78 | Temp 98.0°F | Ht 64.0 in | Wt 142.0 lb

## 2017-09-21 DIAGNOSIS — S8254XA Nondisplaced fracture of medial malleolus of right tibia, initial encounter for closed fracture: Secondary | ICD-10-CM | POA: Diagnosis not present

## 2017-09-21 MED ORDER — HYDROCODONE-ACETAMINOPHEN 5-325 MG PO TABS: ORAL_TABLET | ORAL | 0 refills | Status: DC
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
## 2017-09-22 ENCOUNTER — Ambulatory Visit: Payer: Medicare HMO | Admitting: Orthopaedic Surgery

## 2017-10-06 ENCOUNTER — Ambulatory Visit (INDEPENDENT_AMBULATORY_CARE_PROVIDER_SITE_OTHER): Payer: Self-pay | Admitting: Orthopaedic Surgery

## 2017-10-06 ENCOUNTER — Ambulatory Visit (INDEPENDENT_AMBULATORY_CARE_PROVIDER_SITE_OTHER): Payer: Medicare HMO

## 2017-10-06 DIAGNOSIS — S8254XD Nondisplaced fracture of medial malleolus of right tibia, subsequent encounter for closed fracture with routine healing: Secondary | ICD-10-CM

## 2017-10-06 NOTE — Progress Notes (Signed)
CC:  My ankle has a little swelling  She is doing well with the right ankle.  She has no new trauma.  NV intact. ROM is full.  X-rays were done of the right ankle, reported separately.  Encounter Diagnosis  Name Primary?  . Closed nondisplaced fracture of medial malleolus of right tibia with routine healing, subsequent encounter Yes   Return in one month.  X-rays on return.  Call if any problem.  Precautions discussed.   Electronically Signed Darreld McleanWayne Dontrae Morini, MD 10/31/20182:08 PM

## 2017-11-01 ENCOUNTER — Telehealth: Payer: Self-pay | Admitting: Orthopaedic Surgery

## 2017-11-01 NOTE — Telephone Encounter (Signed)
Patient called, requests to cancel tomorrow's appointment (11/02/17), and hold on re-scheduling for now; states ankle is better now coming out of boot. States also needs to hold due to financial reasons. Aware to call back if needs to re-schedule.

## 2017-11-03 ENCOUNTER — Ambulatory Visit: Payer: Medicare HMO | Admitting: Orthopaedic Surgery

## 2017-11-19 ENCOUNTER — Other Ambulatory Visit: Payer: Self-pay | Admitting: Cardiovascular Disease

## 2018-03-11 ENCOUNTER — Encounter: Payer: Self-pay | Admitting: Cardiovascular Disease

## 2018-03-11 ENCOUNTER — Ambulatory Visit: Payer: Medicare HMO | Admitting: Cardiovascular Disease

## 2018-03-11 ENCOUNTER — Other Ambulatory Visit: Payer: Self-pay

## 2018-03-11 VITALS — BP 123/77 | HR 72 | Ht 64.0 in | Wt 143.0 lb

## 2018-03-11 DIAGNOSIS — E7800 Pure hypercholesterolemia, unspecified: Secondary | ICD-10-CM | POA: Diagnosis not present

## 2018-03-11 DIAGNOSIS — I10 Essential (primary) hypertension: Secondary | ICD-10-CM | POA: Diagnosis not present

## 2018-03-11 DIAGNOSIS — I471 Supraventricular tachycardia: Secondary | ICD-10-CM

## 2018-03-11 DIAGNOSIS — I5181 Takotsubo syndrome: Secondary | ICD-10-CM | POA: Diagnosis not present

## 2018-03-11 NOTE — Progress Notes (Signed)
SUBJECTIVE: The patient presents for follow-up of Takatsubo cardiomyopathy and PSVT. Echocardiogram 02/19/16: Normal left ventricular systolic function and regional wall motion, LVEF 55-60%, mild LVH, grade 1 diastolic dysfunction  She has been doing well overall.  She had some tooth pain and her blood pressure gotten up to as high as 180/90.  Systolic readings are normal in the 110-120 range.  She has been dealing with some feet swelling and cramping last week and thinks it may have been due to tight shoes.  She is now on Crestor 5 mg.  She denies exertional chest pain and exertional dyspnea.  She did struggle with belching and flatus about 3 weeks ago and then took some over-the-counter antacids and symptoms resolved.  I personally reviewed the ECG performed in June 2018 which demonstrated sinus rhythm with some mild nonspecific T wave abnormalities in precordial leads.    Review of Systems: As per "subjective", otherwise negative.  Allergies  Allergen Reactions  . Lasix [Furosemide] Other (See Comments)    Headache, dizziness & throat irritation  . Pollen Extract     Trees, grass, flowers    Current Outpatient Medications  Medication Sig Dispense Refill  . ALPRAZolam (XANAX) 0.5 MG tablet Take 0.5 mg by mouth at bedtime as needed for anxiety.    . carvedilol (COREG) 3.125 MG tablet Take 3.125 mg by mouth 2 (two) times daily with a meal.    . cholecalciferol (VITAMIN D) 1000 units tablet Take 2,000 Units by mouth daily.    . hydrochlorothiazide (MICROZIDE) 12.5 MG capsule Take 12.5 mg by mouth once a week.    Marland Kitchen. HYDROcodone-acetaminophen (NORCO/VICODIN) 5-325 MG tablet One tablet every four hours as needed for acute pain.  Limit of five days per [place]. 30 tablet 0  . KLOR-CON 10 10 MEQ tablet TAKE 1 TABLET BY MOUTH EVERY DAY 30 tablet 3  . montelukast (SINGULAIR) 10 MG tablet Take 1 tablet by mouth daily.  0  . Multiple Vitamins-Minerals (MULTIVITAMIN WITH MINERALS)  tablet Take 1 tablet by mouth daily.    . nitroGLYCERIN (NITROSTAT) 0.4 MG SL tablet Place 1 tablet (0.4 mg total) under the tongue every 5 (five) minutes as needed for chest pain. 25 tablet 3  . ondansetron (ZOFRAN ODT) 4 MG disintegrating tablet Take 1 tablet (4 mg total) by mouth every 8 (eight) hours as needed for nausea or vomiting. 20 tablet 0  . promethazine (PHENERGAN) 25 MG tablet TAKE 1 TABLET (25 MG) BY ORAL ROUTE 2 TIMES PER DAY AS NEEDED FOR NAUSEA.  1  . QUEtiapine (SEROQUEL) 25 MG tablet Take 25 mg by mouth at bedtime.  2  . rosuvastatin (CRESTOR) 5 MG tablet Take 5 mg by mouth daily.  6  . TURMERIC PO Take 1 tablet by mouth daily.     No current facility-administered medications for this visit.     Past Medical History:  Diagnosis Date  . Cardiomyopathy (HCC)   . CHF (congestive heart failure) (HCC)   . Fractures   . GERD (gastroesophageal reflux disease)   . Hepatitis   . Hypercholesteremia   . Insomnia   . Vitiligo     Past Surgical History:  Procedure Laterality Date  . CARDIAC CATHETERIZATION    . CARDIAC CATHETERIZATION    . CESAREAN SECTION      Social History   Socioeconomic History  . Marital status: Widowed    Spouse name: Not on file  . Number of children: Not on  file  . Years of education: Not on file  . Highest education level: Not on file  Occupational History  . Not on file  Social Needs  . Financial resource strain: Not on file  . Food insecurity:    Worry: Not on file    Inability: Not on file  . Transportation needs:    Medical: Not on file    Non-medical: Not on file  Tobacco Use  . Smoking status: Never Smoker  . Smokeless tobacco: Never Used  Substance and Sexual Activity  . Alcohol use: Yes    Alcohol/week: 0.0 oz    Comment: occas  . Drug use: No  . Sexual activity: Not on file  Lifestyle  . Physical activity:    Days per week: Not on file    Minutes per session: Not on file  . Stress: Not on file  Relationships  .  Social connections:    Talks on phone: Not on file    Gets together: Not on file    Attends religious service: Not on file    Active member of club or organization: Not on file    Attends meetings of clubs or organizations: Not on file    Relationship status: Not on file  . Intimate partner violence:    Fear of current or ex partner: Not on file    Emotionally abused: Not on file    Physically abused: Not on file    Forced sexual activity: Not on file  Other Topics Concern  . Not on file  Social History Narrative  . Not on file     Vitals:   03/11/18 1055  BP: 123/77  Pulse: 72  SpO2: 98%  Weight: 143 lb (64.9 kg)  Height: 5\' 4"  (1.626 m)    Wt Readings from Last 3 Encounters:  03/11/18 143 lb (64.9 kg)  09/21/17 142 lb (64.4 kg)  05/13/17 144 lb (65.3 kg)     PHYSICAL EXAM General: NAD HEENT: Normal. Neck: No JVD, no thyromegaly. Lungs: Clear to auscultation bilaterally with normal respiratory effort. CV: Regular rate and rhythm, normal S1/S2, no S3/S4, no murmur. No pretibial or periankle edema.  No carotid bruit.   Abdomen: Soft, nontender, no distention.  Neurologic: Alert and oriented.  Psych: Normal affect. Skin: Normal. Musculoskeletal: No gross deformities.    ECG: Most recent ECG reviewed.   Labs: Lab Results  Component Value Date/Time   K 3.4 (L) 05/14/2017 01:21 AM   BUN 16 05/14/2017 01:21 AM   CREATININE 0.79 05/14/2017 01:21 AM   ALT 19 05/14/2017 01:21 AM   HGB 12.8 05/14/2017 01:21 AM     Lipids: No results found for: LDLCALC, LDLDIRECT, CHOL, TRIG, HDL     ASSESSMENT AND PLAN:  1. Takotsubo cardiomyopathy:  Symptomatically stable.  She has had normalization of left ventricular systolic function. Continue Coreg.  2. PSVT:  Symptom medically stable. Continue Coreg.  3.  Hyperlipidemia: Continue Crestor 5 mg.  4.  Hypertension: Controlled on present therapy which includes low-dose HCTZ.  No changes.   Disposition: Follow up 1  year   Prentice Docker, M.D., F.A.C.C.

## 2018-03-11 NOTE — Patient Instructions (Signed)

## 2018-03-18 ENCOUNTER — Other Ambulatory Visit: Payer: Self-pay | Admitting: Cardiovascular Disease

## 2018-05-03 ENCOUNTER — Telehealth: Payer: Self-pay | Admitting: Cardiovascular Disease

## 2018-05-03 NOTE — Telephone Encounter (Signed)
LMTCB

## 2018-05-03 NOTE — Telephone Encounter (Signed)
Patient called asking if Dr Purvis Sheffield would call her in something for Strep throat

## 2018-05-03 NOTE — Telephone Encounter (Signed)
Advised patient that she would have to be swabbed in order to receive an antibiotic for strep. Advised patient to go to an urgent care or see PCP. Patient verbalized understanding.

## 2018-05-11 ENCOUNTER — Other Ambulatory Visit: Payer: Self-pay | Admitting: Cardiovascular Disease

## 2018-05-11 MED ORDER — HYDROCHLOROTHIAZIDE 12.5 MG PO CAPS: 12.5000 mg | ORAL_CAPSULE | ORAL | 3 refills | Status: DC

## 2018-05-11 NOTE — Telephone Encounter (Signed)
Patient called stating she needs refill on hydrochlorothiazide (MICROZIDE) 12.5 MG capsule   [facility] [place] [place]

## 2018-05-11 NOTE — Telephone Encounter (Signed)
Done

## 2018-07-16 ENCOUNTER — Encounter (HOSPITAL_COMMUNITY): Payer: Self-pay | Admitting: Emergency Medicine

## 2018-07-16 ENCOUNTER — Emergency Department (HOSPITAL_COMMUNITY): Payer: Medicare HMO

## 2018-07-16 ENCOUNTER — Other Ambulatory Visit: Payer: Self-pay

## 2018-07-16 ENCOUNTER — Emergency Department (HOSPITAL_COMMUNITY)
Admission: EM | Admit: 2018-07-16 | Discharge: 2018-07-16 | Disposition: A | Discharge status: Home / Self Care | Payer: Medicare HMO | Attending: Emergency Medicine | Admitting: Emergency Medicine

## 2018-07-16 DIAGNOSIS — Z79899 Other long term (current) drug therapy: Secondary | ICD-10-CM | POA: Diagnosis not present

## 2018-07-16 DIAGNOSIS — R42 Dizziness and giddiness: Secondary | ICD-10-CM | POA: Diagnosis present

## 2018-07-16 DIAGNOSIS — I509 Heart failure, unspecified: Secondary | ICD-10-CM | POA: Diagnosis not present

## 2018-07-16 LAB — COMPREHENSIVE METABOLIC PANEL
ALBUMIN: 4 g/dL (ref 3.5–5.0)
ALK PHOS: 52 U/L (ref 38–126)
ALT: 19 U/L (ref 0–44)
ANION GAP: 6 (ref 5–15)
AST: 25 U/L (ref 15–41)
BUN: 16 mg/dL (ref 6–20)
CALCIUM: 9.7 mg/dL (ref 8.9–10.3)
CO2: 27 mmol/L (ref 22–32)
Chloride: 107 mmol/L (ref 98–111)
Creatinine, Ser: 0.65 mg/dL (ref 0.44–1.00)
GFR calc Af Amer: >60 mL/min (ref >60)
GFR calc non Af Amer: >60 mL/min (ref >60)
Glucose, Bld: 99 mg/dL (ref 70–99)
Potassium: 4 mmol/L (ref 3.5–5.1)
Sodium: 140 mmol/L (ref 135–145)
Total Bilirubin: 0.6 mg/dL (ref 0.3–1.2)
Total Protein: 7.4 g/dL (ref 6.5–8.1)

## 2018-07-16 LAB — CBC WITH DIFFERENTIAL/PLATELET
BASOS PCT: 1 %
Basophils Absolute: 0.1 10*3/uL (ref 0.0–0.1)
EOS ABS: 0.2 10*3/uL (ref 0.0–0.7)
Eosinophils Relative: 3 %
HCT: 38.4 % (ref 36.0–46.0)
HEMOGLOBIN: 12.8 g/dL (ref 12.0–15.0)
Lymphocytes Relative: 23 %
Lymphs Abs: 1.4 10*3/uL (ref 0.7–4.0)
MCH: 30.8 pg (ref 26.0–34.0)
MCHC: 33.3 g/dL (ref 30.0–36.0)
MCV: 92.5 fL (ref 78.0–100.0)
MONOS PCT: 10 %
Monocytes Absolute: 0.6 10*3/uL (ref 0.1–1.0)
NEUTROS PCT: 63 %
Neutro Abs: 3.9 10*3/uL (ref 1.7–7.7)
Platelets: 232 10*3/uL (ref 150–400)
RBC: 4.15 MIL/uL (ref 3.87–5.11)
RDW: 13.2 % (ref 11.5–15.5)
WBC: 6.2 10*3/uL (ref 4.0–10.5)

## 2018-07-16 LAB — URINALYSIS, ROUTINE W REFLEX MICROSCOPIC
Bilirubin Urine: NEGATIVE
GLUCOSE, UA: NEGATIVE mg/dL
Hgb urine dipstick: NEGATIVE
KETONES UR: NEGATIVE mg/dL
LEUKOCYTES UA: NEGATIVE
Nitrite: NEGATIVE
PH: 8 (ref 5.0–8.0)
Protein, ur: NEGATIVE mg/dL
Specific Gravity, Urine: 1.006 (ref 1.005–1.030)

## 2018-07-16 LAB — BRAIN NATRIURETIC PEPTIDE: B Natriuretic Peptide: 31 pg/mL (ref 0.0–100.0)

## 2018-07-16 LAB — TROPONIN I: Troponin I: <0.03 ng/mL (ref <0.03)

## 2018-07-16 NOTE — ED Provider Notes (Signed)
[facility] EMERGENCY DEPARTMENT Provider Note   CSN: [id number] Arrival date & time: 07/16/18  1144     History   Chief Complaint Chief Complaint  Patient presents with  . Chest Pain    HPI Sara Mcmahon is a 57 y.o. female.  HPI  The patient is a 57 year old female, she has a known history of Takatsubo's cardiomyopathy with a history of congestive heart failure and a most recent heart catheterization showing an ejection fraction of 55%.  She is followed by Dr. Purvis Sheffield with the cardiology service, she presents today after having some dizziness that started this morning.  Reports that she felt extremely lightheaded and dizzy like she could not walk straight when she got out of bed.  She did not notice this when she was laying down but when she got up it occurred.  She had no weakness numbness difficulty speaking or changes in her vision.  She notes that this gets worse when she gets up and better when she sits down.  Overall she states that she feels much better at this time than she did when she first woke up this morning.  There is been no chest pain, no shortness of breath, no swelling of the legs.  Symptoms were intermittent, gradually improving, not associated with fevers chills or diarrhea.  Her appetite is been normal however she does report that she does have intermittent urinary frequency.  Past Medical History:  Diagnosis Date  . Cardiomyopathy (HCC)   . CHF (congestive heart failure) (HCC)   . Fractures   . GERD (gastroesophageal reflux disease)   . Hepatitis   . Hypercholesteremia   . Insomnia   . Vitiligo     There are no active problems to display for this patient.   Past Surgical History:  Procedure Laterality Date  . CARDIAC CATHETERIZATION    . CARDIAC CATHETERIZATION    . CESAREAN SECTION       OB History    Gravida  2   Para  2   Term  2   Preterm      AB      Living        SAB      TAB      Ectopic      Multiple      Live  Births               Home Medications    Prior to Admission medications   Medication Sig Start Date End Date Taking? Authorizing Provider  ALPRAZolam Prudy Feeler) 0.5 MG tablet Take 0.5 mg by mouth at bedtime as needed for anxiety.    [provider]  carvedilol (COREG) 3.125 MG tablet Take 3.125 mg by mouth 2 (two) times daily with a meal.    [provider]  cholecalciferol (VITAMIN D) 1000 units tablet Take 2,000 Units by mouth daily.    [provider]  hydrochlorothiazide (MICROZIDE) 12.5 MG capsule Take 1 capsule (12.5 mg total) by mouth once a week. 05/11/18   Laqueta Linden, MD  HYDROcodone-acetaminophen (NORCO/VICODIN) 5-325 MG tablet One tablet every four hours as needed for acute pain.  Limit of five days per [place] statue. 09/21/17   Darreld Mclean, MD  KLOR-CON 10 10 MEQ tablet TAKE 1 TABLET BY MOUTH EVERY DAY 03/18/18   Laqueta Linden, MD  montelukast (SINGULAIR) 10 MG tablet Take 1 tablet by mouth daily. 09/26/16   [provider]  Multiple Vitamins-Minerals (MULTIVITAMIN WITH MINERALS)  tablet Take 1 tablet by mouth daily.    [provider]  nitroGLYCERIN (NITROSTAT) 0.4 MG SL tablet Place 1 tablet (0.4 mg total) under the tongue every 5 (five) minutes as needed for chest pain. 08/12/16   Laqueta LindenKoneswaran, Suresh A, MD  ondansetron (ZOFRAN ODT) 4 MG disintegrating tablet Take 1 tablet (4 mg total) by mouth every 8 (eight) hours as needed for nausea or vomiting. 05/14/17   Rancour, Jeannett SeniorStephen, MD  promethazine (PHENERGAN) 25 MG tablet TAKE 1 TABLET (25 MG) BY ORAL ROUTE 2 TIMES PER DAY AS NEEDED FOR NAUSEA. 02/16/18   [provider]  QUEtiapine (SEROQUEL) 25 MG tablet Take 25 mg by mouth at bedtime. 02/17/18   [provider]  rosuvastatin (CRESTOR) 5 MG tablet Take 5 mg by mouth daily. 02/16/18   [provider]  TURMERIC PO Take 1 tablet by mouth daily.    [provider]    Family History Family  History  Problem Relation Age of Onset  . Hypertension Mother   . Cancer Mother   . Heart disease Father   . Hypertension Father     Social History Social History   Tobacco Use  . Smoking status: Never Smoker  . Smokeless tobacco: Never Used  Substance Use Topics  . Alcohol use: Never    Alcohol/week: 0.0 standard drinks    Frequency: Never  . Drug use: No     Allergies   Lasix [furosemide] and Pollen extract   Review of Systems Review of Systems  All other systems reviewed and are negative.    Physical Exam Updated Vital Signs BP 122/84   Pulse 64   Temp 97.9 F (36.6 C) (Oral)   Resp (!) 22   Ht 5\' 4"  (1.626 m)   Wt 62.1 kg   SpO2 100%   BMI 23.52 kg/m   Physical Exam  Constitutional: She appears well-developed and well-nourished. No distress.  HENT:  Head: Normocephalic and atraumatic.  Mouth/Throat: Oropharynx is clear and moist. No oropharyngeal exudate.  Eyes: Pupils are equal, round, and reactive to light. Conjunctivae and EOM are normal. Right eye exhibits no discharge. Left eye exhibits no discharge. No scleral icterus.  Neck: Normal range of motion. Neck supple. No JVD present. No thyromegaly present.  Cardiovascular: Normal rate, regular rhythm, normal heart sounds and intact distal pulses. Exam reveals no gallop and no friction rub.  No murmur heard. Pulmonary/Chest: Effort normal and breath sounds normal. No respiratory distress. She has no wheezes. She has no rales.  Abdominal: Soft. Bowel sounds are normal. She exhibits no distension and no mass. There is no tenderness.  Musculoskeletal: Normal range of motion. She exhibits no edema or tenderness.  Lymphadenopathy:    She has no cervical adenopathy.  Neurological: She is alert. Coordination normal.  Speech is clear, cranial nerves III through XII are intact, memory is intact, strength is normal in all 4 extremities including grips, sensation is intact to light touch and pinprick in all 4  extremities. Coordination as tested by finger-nose-finger is normal, no limb ataxia. Normal gait, normal reflexes at the patellar tendons bilaterally  Skin: Skin is warm and dry. No rash noted. No erythema.  Psychiatric: She has a normal mood and affect. Her behavior is normal.  Nursing note and vitals reviewed.    ED Treatments / Results  Labs (all labs ordered are listed, but only abnormal results are displayed) Labs Reviewed  URINALYSIS, ROUTINE W REFLEX MICROSCOPIC - Abnormal; Notable for the following components:  Result Value   Color, Urine STRAW (*)    All other components within normal limits  CBC WITH DIFFERENTIAL/PLATELET  COMPREHENSIVE METABOLIC PANEL  BRAIN NATRIURETIC PEPTIDE  TROPONIN I    EKG EKG Interpretation  Date/Time:  Saturday July 16 2018 11:53:34 EDT Ventricular Rate:  69 PR Interval:  170 QRS Duration: 70 QT Interval:  386 QTC Calculation: 413 R Axis:   51 Text Interpretation:  Normal sinus rhythm Normal ECG since last tracing no significant change Confirmed by Eber Hong ([id number]) on 07/16/2018 12:23:11 PM   Radiology Ct Head Wo Contrast  Result Date: 07/16/2018 CLINICAL DATA:  Ataxia, heaviness, dizziness, chest heaviness for 2 weeks EXAM: CT HEAD WITHOUT CONTRAST TECHNIQUE: Contiguous axial images were obtained from the base of the skull through the vertex without intravenous contrast. COMPARISON:  None. FINDINGS: Brain: No evidence of acute infarction, hemorrhage, hydrocephalus, extra-axial collection or mass lesion/mass effect. Vascular: No hyperdense vessel or unexpected calcification. Skull: No osseous abnormality. Sinuses/Orbits: Visualized paranasal sinuses are clear. Visualized mastoid sinuses are clear. Visualized orbits demonstrate no focal abnormality. Other: None IMPRESSION: No acute intracranial pathology. Electronically Signed   By: Elige Ko   On: 07/16/2018 13:25    Procedures Procedures (including critical care  time)  Medications Ordered in ED Medications - No data to display   Initial Impression / Assessment and Plan / ED Course  I have reviewed the triage vital signs and the nursing notes.  Pertinent labs & imaging results that were available during my care of the patient were reviewed by me and considered in my medical decision making (see chart for details).     The exam is unremarkable, the patient is well-appearing, she was able to ambulate with me in the hallway, she can heel walk, tiptoe, walk a straight line without any ataxia and has no difficulty with finger-nose-finger, heel shin or speech.  Her ocular exam is normal, cranial nerves III through XII are normal, cardiac exam is unremarkable.  The patient may have some orthostasis, otherwise she appears well, will rule out pathologic causes of her symptoms with CT scan of the brain, EKG, labs.  Patient agreeable.  He does have some urinary frequency and a urinalysis has been ordered as well.  Labs unremarkable, this includes urinalysis, blood counts, CMP, troponin and BNP.  CT scan of the brain shows no signs of stroke and orthostatic vital signs revealed no signs of orthostasis.  The patient is well-appearing, she was given instructions for return and is agreeable.  At this time I do not think further testing is needed  Final Clinical Impressions(s) / ED Diagnoses   Final diagnoses:  Dizziness     Eber Hong, MD 07/16/18 1336

## 2018-07-16 NOTE — Discharge Instructions (Signed)
Your testing today does not show any specific findings in fact your blood work was totally normal, there is no signs of low ejection fraction or congestive heart failure, your CT scan is normal and shows no signs of stroke and your vital signs including her blood pressure have all been in a normal range.  Please avoid any stressful activity over the next couple of days.  Plenty of fluids, return to the emergency department for severe or worsening symptoms, I would encourage you to see your doctor within 48 hours for recheck.

## 2018-07-16 NOTE — ED Triage Notes (Addendum)
Patient c/o chest heaviness and dizziness. Chest heaviness x2 weeks. Per patient extreme fatigue on Wednesday, today dizziness upon waking and unable to walk. Patient went to bed at 10:30 last night, went to bathroom at 4:30am with some "wobble when she walked" and woke at 8:20 am with the extreme dizziness. Patient states dizziness has improved but now generalized weakness. Denies any swelling in extremities. Patient reports hx of CHF.

## 2018-08-22 ENCOUNTER — Other Ambulatory Visit: Payer: Self-pay | Admitting: Cardiovascular Disease

## 2018-08-22 MED ORDER — CARVEDILOL 3.125 MG PO TABS: 3.1250 mg | ORAL_TABLET | Freq: Two times a day (BID) | ORAL | 1 refills | Status: DC

## 2018-08-22 NOTE — Telephone Encounter (Signed)
° ° °  1. Which medications need to be refilled? (please list name of each medication and dose if known) carvedilol (COREG) 3.125 MG    2. Which pharmacy/location (including street and city if local pharmacy) is medication to be sent to?   [facility]  [place],  3. Do they need a 30 day or 90 day supply?  90

## 2018-08-22 NOTE — Addendum Note (Signed)
Addended by: Raelyn NumberWILLIAMSON, Leviticus Harton L on: 08/22/2018 09:33 AM   Modules accepted: Orders

## 2018-09-13 ENCOUNTER — Other Ambulatory Visit: Payer: Self-pay | Admitting: Cardiovascular Disease

## 2018-09-22 ENCOUNTER — Other Ambulatory Visit: Payer: Self-pay | Admitting: Cardiovascular Disease

## 2018-12-15 DIAGNOSIS — Z780 Asymptomatic menopausal state: Secondary | ICD-10-CM | POA: Diagnosis not present

## 2018-12-15 DIAGNOSIS — G4700 Insomnia, unspecified: Secondary | ICD-10-CM | POA: Diagnosis not present

## 2018-12-15 DIAGNOSIS — R5383 Other fatigue: Secondary | ICD-10-CM | POA: Diagnosis not present

## 2019-01-05 DIAGNOSIS — Z780 Asymptomatic menopausal state: Secondary | ICD-10-CM | POA: Diagnosis not present

## 2019-01-05 DIAGNOSIS — G4700 Insomnia, unspecified: Secondary | ICD-10-CM | POA: Diagnosis not present

## 2019-01-05 DIAGNOSIS — R5383 Other fatigue: Secondary | ICD-10-CM | POA: Diagnosis not present

## 2019-02-08 DIAGNOSIS — G4700 Insomnia, unspecified: Secondary | ICD-10-CM | POA: Diagnosis not present

## 2019-02-08 DIAGNOSIS — I10 Essential (primary) hypertension: Secondary | ICD-10-CM | POA: Diagnosis not present

## 2019-02-08 DIAGNOSIS — R5383 Other fatigue: Secondary | ICD-10-CM | POA: Diagnosis not present

## 2019-02-20 ENCOUNTER — Other Ambulatory Visit: Payer: Self-pay | Admitting: Cardiovascular Disease

## 2019-02-20 MED ORDER — CARVEDILOL 3.125 MG PO TABS: 3.1250 mg | ORAL_TABLET | Freq: Two times a day (BID) | ORAL | 0 refills | Status: DC

## 2019-02-20 NOTE — Telephone Encounter (Signed)
° °  1. Which medications need to be refilled? (please list name of each medication and dose if known) carvedilol (COREG) 3.125 MG tablet    2. Which pharmacy/location (including street and city if local pharmacy) is medication to be sent to?[facility]    3. Do they need a 30 day or 90 day supply?

## 2019-02-21 ENCOUNTER — Other Ambulatory Visit: Payer: Self-pay

## 2019-02-21 MED ORDER — CARVEDILOL 3.125 MG PO TABS: 3.1250 mg | ORAL_TABLET | Freq: Two times a day (BID) | ORAL | 0 refills | Status: DC

## 2019-03-02 ENCOUNTER — Encounter (INDEPENDENT_AMBULATORY_CARE_PROVIDER_SITE_OTHER): Payer: Self-pay | Admitting: Nurse Practitioner

## 2019-03-06 DIAGNOSIS — I10 Essential (primary) hypertension: Secondary | ICD-10-CM | POA: Diagnosis not present

## 2019-03-06 DIAGNOSIS — G4700 Insomnia, unspecified: Secondary | ICD-10-CM | POA: Diagnosis not present

## 2019-03-06 DIAGNOSIS — Z7189 Other specified counseling: Secondary | ICD-10-CM | POA: Diagnosis not present

## 2019-03-06 DIAGNOSIS — R5383 Other fatigue: Secondary | ICD-10-CM | POA: Diagnosis not present

## 2019-03-15 ENCOUNTER — Telehealth: Payer: Self-pay | Admitting: *Deleted

## 2019-03-15 NOTE — Telephone Encounter (Signed)
   Primary Cardiologist:  Prentice Docker, MD   Patient contacted.  History reviewed.  No symptoms to suggest any unstable cardiac conditions.  Based on discussion, with current pandemic situation, we will be postponing this appointment for Sara Mcmahon with a plan for f/u in July 2020 or sooner if feasible/necessary.  If symptoms change, she has been instructed to contact our office.      Thalia Bloodgood, RN  03/15/2019 4:40 PM         .

## 2019-03-21 ENCOUNTER — Ambulatory Visit: Payer: Medicare HMO | Admitting: Cardiovascular Disease

## 2019-04-05 ENCOUNTER — Ambulatory Visit (INDEPENDENT_AMBULATORY_CARE_PROVIDER_SITE_OTHER): Payer: Medicare HMO | Admitting: Internal Medicine

## 2019-04-05 ENCOUNTER — Other Ambulatory Visit: Payer: Self-pay | Admitting: *Deleted

## 2019-04-05 DIAGNOSIS — I10 Essential (primary) hypertension: Secondary | ICD-10-CM | POA: Diagnosis not present

## 2019-04-05 DIAGNOSIS — R5383 Other fatigue: Secondary | ICD-10-CM | POA: Diagnosis not present

## 2019-04-05 DIAGNOSIS — I420 Dilated cardiomyopathy: Secondary | ICD-10-CM | POA: Diagnosis not present

## 2019-04-05 NOTE — Patient Outreach (Addendum)
[facility]) Care Management  04/05/2019  Sara Mcmahon 1961-04-24 [id number]   RN Health Coach HTA HRA Outreach   Outreach Attempt:  Successful telephone outreach to patient for the HTA Health Risk Assessment screening.  HIPAA verified with patient.  Patient acknowledges history of congestive heart failure.  States she does follow cardiologist for diagnosis and disease process has been well controlled.  Reports weighing herself every other day and cannot recall the Heart Failure Zones.  Does report having trouble affording her Azelastine Nasal spray which she needs for her allergies.  RN Health Coach reviewed and discussed [facility] services.  Patient verbally agrees to Disease Management outreaches and [facility] referral for medication assistance.  Plan: RN Health Coach will add my name to patient's treatment team. RN Health Coach will make patient active with [facility] services. RN Health Coach will make another telephone outreach to patient within the month of May to complete initial telephone assessment. RN Health Coach will make [facility] referral for possible medication assistance. RN Health Coach will send patient Health Coach Letter and Successful Outreach Letter with program brochure and magnet.  Rhae Lerner RN [facility] Care Management  RN Health Coach [phone] [email or web address]

## 2019-04-12 ENCOUNTER — Ambulatory Visit: Payer: Self-pay | Admitting: Pharmacist

## 2019-04-12 ENCOUNTER — Other Ambulatory Visit: Payer: Self-pay | Admitting: Pharmacist

## 2019-04-17 ENCOUNTER — Other Ambulatory Visit: Payer: Self-pay | Admitting: *Deleted
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
## 2019-04-19 ENCOUNTER — Encounter: Payer: Self-pay | Admitting: *Deleted

## 2019-04-19 ENCOUNTER — Other Ambulatory Visit: Payer: Self-pay | Admitting: *Deleted

## 2019-04-19 NOTE — Patient Outreach (Signed)
[facility]) Care Management  [facility] Pharmacy   04/19/2019  Sara Mcmahon 18-Aug-1961 [id number]  Reason for referral: Medication Assistance  Referral source: [facility] RN Current insurance: [employer]age  PMHx includes but not limited to:  GERD, HLD, anxiety, allergies, EF55%  Outreach:  Successful telephone call with [name].  HIPAA identifiers verified.  Patient is agreeable to review medications telephonically.  Patient states she is unable to afford Astelin nasal spray.  Astelin appears to be a Tier 2 medication on insurance and carriers an Sales promotion account executive cost.  Encouraged patient to speak with MD about trying Flonase OTC or Flonase RX (Tier 1).  She will start Flonase and can request RX if medication is helpful.  Also, encouraged patient to start back Singulair as prescribed by MD.  She states the pollen has become unbearable.  All other medications reviewed and updated medication list in Epic.  Objective: Lab Results  Component Value Date   CREATININE 0.65 07/16/2018   CREATININE 0.79 05/14/2017   CREATININE 0.68 02/08/2017    BP Readings from Last 3 Encounters:  07/16/18 127/84  03/11/18 123/77  09/21/17 108/68   Allergies  Allergen Reactions  . Lasix [Furosemide] Other (See Comments)    Headache, dizziness & throat irritation  . Pollen Extract     Trees, grass, flowers    Medications Reviewed Today    Reviewed by Danella Maiers, [facility] (Pharmacist) on 04/12/19 at 1717  Med List Status: <None>  Medication Order Taking? Sig Documenting Provider Last Dose Status Informant  ALPRAZolam (XANAX) 0.5 MG tablet [id number] Yes Take 0.5 mg by mouth at bedtime as needed for anxiety. [provider] Taking Active Self  carvedilol (COREG) 3.125 MG tablet [id number] Yes Take 1 tablet (3.125 mg total) by mouth 2 (two) times daily with a meal. Laqueta Linden, MD Taking Active   cholecalciferol (VITAMIN D) 1000 units tablet [id number] Yes Take  2,000 Units by mouth daily. [provider] Taking Active Self  hydrochlorothiazide (MICROZIDE) 12.5 MG capsule [id number] Yes Take 1 capsule (12.5 mg total) by mouth once a week. Laqueta Linden, MD Taking Active            Med Note [name], Lilla Shook   Wed Apr 12, 2019  5:03 PM) As needed for swelling  montelukast (SINGULAIR) 10 MG tablet [id number] No Take 1 tablet by mouth daily. [provider] Not Taking Active Self           Med Note [name], Lilla Shook   Wed Apr 12, 2019  5:06 PM) Not on currently restart   Multiple Vitamins-Minerals (MULTIVITAMIN WITH MINERALS) tablet [id number] Yes Take 1 tablet by mouth daily. [provider] Taking Active Self  nitroGLYCERIN (NITROSTAT) 0.4 MG SL tablet [id number] No Place 1 tablet (0.4 mg total) under the tongue every 5 (five) minutes as needed for chest pain.  Patient not taking:  Reported on 04/12/2019   Laqueta Linden, MD Not Taking Active Self  potassium chloride (K-DUR) 10 MEQ tablet [id number] Yes TAKE 1 TABLET BY MOUTH EVERY DAY Laqueta Linden, MD Taking Active   promethazine (PHENERGAN) 25 MG tablet [id number] No TAKE 1 TABLET (25 MG) BY ORAL ROUTE 2 TIMES PER DAY AS NEEDED FOR NAUSEA. [provider] Not Taking Active   rosuvastatin (CRESTOR) 5 MG tablet [id number] Yes TAKE 1 TABLET BY MOUTH EVERY DAY Laqueta Linden, MD Taking Active   TURMERIC PO [id number]  Take 1 tablet by mouth daily.  [provider]  Active Self          Assessment:  Drugs sorted by system:  Neurologic/Psychologic: alprazolam  Cardiovascular: Coreg, HCTZ prn swelling, Crestor  Pulmonary/Allergy: Flonase nasal, Singulair  Vitamins/Minerals/Supplements: vitD  Medication Review Findings:  . Patient not taking Astelin due to cost.  Flonase is preferred on HTA insurance.  Patient trying OTC Flonase before RX is called in. . No recent LDL/cholesterol labs in Epic or [facility], however patient is on  Crestor . Encouraged patient to also add back Singulair as prescribed if needed after Flonase is starting to work.   Plan: . Will follow up with patient next month regarding allergies/medications  Kieth BrightlyJulie Dattero Feliciano Wynter, PharmD, [facility] Clinical Pharmacist [facility]  [phone]

## 2019-04-19 NOTE — Patient Outreach (Signed)
[facility]) Care Management  04/19/2019  LONETTE SAVEL 1961-06-10 [id number]   CSW attempted to outreach pt for initial screening/assessment without success. CSW left a HIPPA compliant message and will send unsuccessful outreach letter. CSW will try again in the next 3-4 business days per policy.   Reece Levy, MSW, LCSW Clinical Social Worker  [facility] [employer] [facility] [phone]

## 2019-04-24 ENCOUNTER — Encounter: Payer: Self-pay | Admitting: *Deleted

## 2019-04-24 ENCOUNTER — Other Ambulatory Visit: Payer: Self-pay | Admitting: *Deleted
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
## 2019-04-25 ENCOUNTER — Ambulatory Visit: Payer: Self-pay | Admitting: *Deleted

## 2019-04-26 ENCOUNTER — Ambulatory Visit: Payer: Self-pay | Admitting: *Deleted

## 2019-04-26 ENCOUNTER — Other Ambulatory Visit: Payer: Self-pay | Admitting: *Deleted

## 2019-04-26 NOTE — Patient Outreach (Signed)
[facility]) Care Management  04/26/2019  Sara Mcmahon [birth year] [id number]   RN Health Coach Initial Assessment  Referral Date:  04/05/2019 Referral Source:  HTA HRA Screening Reason for Referral:  Disease Management Education Insurance:  Health Team Advantage   Outreach Attempt:  Outreach attempt #2 to patient for initial telephone assessment. No answer. RN Health Coach left HIPAA compliant voicemail message along with contact information.  Plan:  RN Health Coach will make another outreach attempt within the month of June if no return call back from patient.  Rhae Lerner RN [facility] Care Management  RN Health Coach [phone] [email or web address]

## 2019-05-02 ENCOUNTER — Other Ambulatory Visit: Payer: Self-pay | Admitting: *Deleted

## 2019-05-03 ENCOUNTER — Ambulatory Visit: Payer: Self-pay | Admitting: Pharmacist

## 2019-05-04 ENCOUNTER — Ambulatory Visit: Payer: Self-pay | Admitting: Pharmacist

## 2019-05-04 NOTE — Patient Outreach (Signed)
[facility]) Care Management  05/04/2019  Sara Mcmahon August 19, 1961 [id number]  CSW contacted pt who reports she is doing well and does not need anything at this time. " I am much better and don't need anything at this time". She is appreciative of our support; also is enjoying time with her granddaughter.  CSW will plan a follow up call next week.     Reece Levy, MSW, LCSW Clinical Social Worker  [facility] [employer] [phone]

## 2019-05-11 ENCOUNTER — Other Ambulatory Visit: Payer: Self-pay | Admitting: *Deleted

## 2019-05-12 ENCOUNTER — Other Ambulatory Visit: Payer: Self-pay | Admitting: Pharmacist

## 2019-05-12 ENCOUNTER — Ambulatory Visit: Payer: Self-pay | Admitting: Pharmacist
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
## 2019-05-16 NOTE — Patient Outreach (Signed)
[facility]) Care Management  [place]  05/16/2019  SARIE STALL 10-Apr-1961 [id number]   Reason for referral: Medication Assistance, Medication Management  Referral source: [facility] RN Current insurance: Health Team Advantage  Reason for call: medication management follow up/allergy&pulm  Outreach:  Unsuccessful telephone call attempt #1 to patient.   HIPAA compliant voicemail left requesting a return call  Plan:  -I will make another outreach attempt to patient within 3-4 business days.    Regina Eck, PharmD, [place]  [phone]

## 2019-05-18 ENCOUNTER — Ambulatory Visit: Payer: Self-pay | Admitting: Pharmacist

## 2019-05-18 ENCOUNTER — Other Ambulatory Visit: Payer: Self-pay | Admitting: Pharmacist

## 2019-05-18 NOTE — Patient Outreach (Signed)
[place] [facility]) Care Management [place]  05/18/2019  Sara Mcmahon 1961/10/20 [id number]  Reason for referral: medication assistance/management  [facility] pharmacy case is being closed due to the following reasons:  Goals have been met.  Patient has had great success using Flonas nasal spray OTC.  She reports decreased allergy symptoms (watery eyes, wheezing, itching) and puffiness around her eyes.  Left message with PCP (Dr. Lanice Shirts office) requesting RX to be called in to [facility] in [place], [place].  This will allow patient to have a $0 copay vs. Paying $20/month for OTC Flonase product.  No other pharmacy needs identified.    Patient has been provided [facility] CM contact information if assistance needed in the future.    Thank you for allowing [facility] pharmacy to be involved in this patient's care.    PLAN: -I will alert [facility] CM team members of case closure   Regina Eck, PharmD, [employer] Clinical Pharmacist, [facility] [place]: [phone]

## 2019-05-23 ENCOUNTER — Ambulatory Visit: Payer: Self-pay | Admitting: *Deleted

## 2019-05-24 ENCOUNTER — Ambulatory Visit: Payer: Self-pay | Admitting: *Deleted

## 2019-05-26 ENCOUNTER — Other Ambulatory Visit: Payer: Self-pay | Admitting: Cardiovascular Disease

## 2019-05-30 ENCOUNTER — Other Ambulatory Visit: Payer: Self-pay | Admitting: *Deleted

## 2019-05-31 NOTE — Patient Outreach (Signed)
[place] [facility]) Care Management  05/31/2019  Sara Mcmahon 05/09/1961 [id number]   CSW was able to make contact with pt who reports she is doing well- no complaints.   CSW will plan follow up call in 2 weeks.   Eduard Clos, MSW, [place] Worker  [place] [phone]

## 2019-06-01 ENCOUNTER — Telehealth: Payer: Self-pay | Admitting: *Deleted

## 2019-06-01 NOTE — Telephone Encounter (Signed)
Patient contacted to cancel/reschedule appt d/t pandemic. Patient request to keep appointment d/t reports of intermittent dizziness.

## 2019-06-02 ENCOUNTER — Other Ambulatory Visit: Payer: Self-pay | Admitting: *Deleted

## 2019-06-02 NOTE — Telephone Encounter (Signed)
That is fine 

## 2019-06-02 NOTE — Patient Outreach (Signed)
[place] [facility]) Care Management  06/02/2019  JENESYS CASSEUS [birth year] [id number]   Case Closure/Transition to [facility] Health/CCI  Referral Date:04/05/2019 Referral Source:HTA HRA Screening Reason for Referral:Disease Management Education Insurance:Health Team Advantage   Outreach:  Patient case has been transitioned to [facility]I for further Care Management assistance.  [facility] Social Worker continues to follow patient for resources and support.  Plan: RN Health Coach will close case at this time. RN Health Coach will send primary care provider Care Management Case Closure Letter. RN Health Coach will keep patient iactive with [place] Management at this time due to [place] Worker continuing to work with patient.  [place] Coach [phone] [email or web address]

## 2019-06-05 NOTE — Telephone Encounter (Signed)
Patient informed. 

## 2019-06-13 ENCOUNTER — Ambulatory Visit: Payer: Self-pay | Admitting: *Deleted

## 2019-06-15 ENCOUNTER — Ambulatory Visit: Payer: PPO | Admitting: Cardiovascular Disease

## 2019-06-15 ENCOUNTER — Encounter: Payer: Self-pay | Admitting: *Deleted

## 2019-06-15 ENCOUNTER — Encounter: Payer: Self-pay | Admitting: Cardiovascular Disease

## 2019-06-15 VITALS — BP 117/74 | HR 70 | Ht 64.0 in | Wt 142.4 lb

## 2019-06-15 DIAGNOSIS — E7800 Pure hypercholesterolemia, unspecified: Secondary | ICD-10-CM | POA: Diagnosis not present

## 2019-06-15 DIAGNOSIS — I10 Essential (primary) hypertension: Secondary | ICD-10-CM

## 2019-06-15 DIAGNOSIS — R42 Dizziness and giddiness: Secondary | ICD-10-CM | POA: Diagnosis not present

## 2019-06-15 DIAGNOSIS — I471 Supraventricular tachycardia, unspecified: Secondary | ICD-10-CM

## 2019-06-15 DIAGNOSIS — I5181 Takotsubo syndrome: Secondary | ICD-10-CM | POA: Diagnosis not present

## 2019-06-15 NOTE — Progress Notes (Signed)
SUBJECTIVE: The patient presents for follow-up of Takatsubocardiomyopathy and PSVT.  Echocardiogram 02/19/16: Normal left ventricular systolic function and regional wall motion, LVEF 55-60%, mild LVH, grade 1 diastolic dysfunction  She underwent coronary angiography in September 2016 which was normal.  She has a history of anxiety and takes Xanax at night to help her sleep.  She denies chest pain and palpitations.  She occasionally has some exertional fatigue.  She denies leg swelling, orthopnea, and paroxysmal nocturnal dyspnea.  She has some left shoulder pain prickly when she wakes up in the morning.  She has a strong faith.  She talked about her occasional depression and her desire for companionship.  She is widowed.  She has occasional dizziness occurring about once a week.  She does not drink a lot of water.  Her daughter and granddaughter live with her at present.  ECG performed in the office today which I ordered and personally interpreted demonstrates normal sinus rhythm with no ischemic ST segment or T-wave abnormalities, nor any arrhythmias.    Review of Systems: As per "subjective", otherwise negative.  Allergies  Allergen Reactions   Lasix [Furosemide] Other (See Comments)    Headache, dizziness & throat irritation   Pollen Extract     Trees, grass, flowers    Current Outpatient Medications  Medication Sig Dispense Refill   ALPRAZolam (XANAX) 0.5 MG tablet Take 0.5 mg by mouth at bedtime as needed for anxiety.     carvedilol (COREG) 3.125 MG tablet TAKE 1 TABLET BY MOUTH TWICE DAILY WITH A MEAL 180 tablet 0   cholecalciferol (VITAMIN D) 1000 units tablet Take 2,000 Units by mouth daily.     fluticasone (FLONASE) 50 MCG/ACT nasal spray Place into both nostrils daily.     hydrochlorothiazide (MICROZIDE) 12.5 MG capsule Take 1 capsule (12.5 mg total) by mouth once a week. 12 capsule 3   Multiple Vitamins-Minerals (MULTIVITAMIN WITH MINERALS) tablet  Take 1 tablet by mouth daily.     nitroGLYCERIN (NITROSTAT) 0.4 MG SL tablet Place 1 tablet (0.4 mg total) under the tongue every 5 (five) minutes as needed for chest pain. 25 tablet 3   potassium chloride (K-DUR) 10 MEQ tablet TAKE 1 TABLET BY MOUTH EVERY DAY 30 tablet 6   rosuvastatin (CRESTOR) 5 MG tablet TAKE 1 TABLET BY MOUTH EVERY DAY 90 tablet 2   No current facility-administered medications for this visit.     Past Medical History:  Diagnosis Date   Cardiomyopathy ([place])    CHF (congestive heart failure) (HCC)    Fractures    GERD (gastroesophageal reflux disease)    Hepatitis    Hypercholesteremia    Insomnia    Vitiligo     Past Surgical History:  Procedure Laterality Date   CARDIAC CATHETERIZATION     CARDIAC CATHETERIZATION     CESAREAN SECTION      Social History   Socioeconomic History   Marital status: Widowed    Spouse name: Not on file   Number of children: Not on file   Years of education: Not on file   Highest education level: Not on file  Occupational History   Not on file  Social Needs   Financial resource strain: Not on file   Food insecurity    Worry: Not on file    Inability: Not on file   Transportation needs    Medical: Not on file    Non-medical: Not on file  Tobacco Use   Smoking status:  Never Smoker   Smokeless tobacco: Never Used  Substance and Sexual Activity   Alcohol use: Never    Alcohol/week: 0.0 standard drinks    Frequency: Never   Drug use: No   Sexual activity: Not on file  Lifestyle   Physical activity    Days per week: Not on file    Minutes per session: Not on file   Stress: Not on file  Relationships   Social connections    Talks on phone: Not on file    Gets together: Not on file    Attends religious service: Not on file    Active member of club or organization: Not on file    Attends meetings of clubs or organizations: Not on file    Relationship status: Not on file   Intimate  partner violence    Fear of current or ex partner: Not on file    Emotionally abused: Not on file    Physically abused: Not on file    Forced sexual activity: Not on file  Other Topics Concern   Not on file  Social History Narrative   Not on file     Vitals:   06/15/19 1105  BP: 117/74  Pulse: 70  SpO2: 97%  Weight: 142 lb 6.4 oz (64.6 kg)  Height: 5\' 4"  (1.626 m)    Wt Readings from Last 3 Encounters:  06/15/19 142 lb 6.4 oz (64.6 kg)  07/16/18 137 lb (62.1 kg)  03/11/18 143 lb (64.9 kg)     PHYSICAL EXAM General: NAD HEENT: Normal. Neck: No JVD, no thyromegaly. Lungs: Clear to auscultation bilaterally with normal respiratory effort. CV: Regular rate and rhythm, normal S1/S2, no S3/S4, no murmur. No pretibial or periankle edema.  No carotid bruit.   Abdomen: Soft, nontender, no distention.  Neurologic: Alert and oriented.  Psych: Normal affect. Skin: Vitiligo. Musculoskeletal: No gross deformities.    ECG: Reviewed above under Subjective   Labs: Lab Results  Component Value Date/Time   K 4.0 07/16/2018 12:32 PM   BUN 16 07/16/2018 12:32 PM   CREATININE 0.65 07/16/2018 12:32 PM   ALT 19 07/16/2018 12:32 PM   HGB 12.8 07/16/2018 12:32 PM     Lipids: No results found for: LDLCALC, LDLDIRECT, CHOL, TRIG, HDL     ASSESSMENT AND PLAN: 1. Takotsubo cardiomyopathy: Symptomatically stable.  She has had normalization of left ventricular systolic function.  I will wean her off carvedilol to see if this improves dizziness.  2. PSVT: Symptomatically stable.  I will wean her off carvedilol to see if this improves dizziness.   3.  Hyperlipidemia: Continue Crestor 5 mg.  4.  Hypertension: Controlled on present therapy which includes low-dose HCTZ.    I told her to monitor her blood pressure as I wean her off carvedilol.   Disposition: Follow up 1 year   Prentice DockerSuresh Mikeria Valin, M.D., F.A.C.C.

## 2019-06-15 NOTE — Patient Instructions (Addendum)
Medication Instructions:   Decrease Coreg to daily x 3 days, then STOP.  Continue all other medications.    Labwork: none  Testing/Procedures: none  Follow-Up: Your physician wants you to follow up in:  1 year.  You will receive a reminder letter in the mail one-two months in advance.  If you don't receive a letter, please call our office to schedule the follow up appointment   Any Other Special Instructions Will Be Listed Below (If Applicable).  If you need a refill on your cardiac medications before your next appointment, please call your pharmacy.

## 2019-06-20 ENCOUNTER — Other Ambulatory Visit: Payer: Self-pay | Admitting: *Deleted

## 2019-06-20 NOTE — Patient Outreach (Addendum)
[place] [facility]) Care Management  06/20/2019  JENE HUQ 1961/06/29 [id number]   CSW spoke with pt who was in good spirits; positive and reported plans for vacation soon. She has found ways to stand up for herself and be more vocal with family conflict.  CSW commended pt for this and encouraged her to continue to find ways to focus on her needs.  CSW offered support and encouragement as she focuses on some personal goals/wishes to help others through volunteering and in a faith centered way.   Pt also indicated difficulty with cost of Flonase and asks that I have [facility] contact her.   CSW will plan to follow up with pt again next week.   Eduard Clos, MSW, [place] Worker  [place] [phone]

## 2019-06-27 ENCOUNTER — Ambulatory Visit: Payer: Self-pay | Admitting: *Deleted

## 2019-06-27 ENCOUNTER — Other Ambulatory Visit: Payer: Self-pay | Admitting: *Deleted

## 2019-06-29 ENCOUNTER — Other Ambulatory Visit: Payer: Self-pay | Admitting: *Deleted

## 2019-06-30 NOTE — Patient Outreach (Signed)
[place] [facility]) Care Management  06/30/2019  Sara Mcmahon June 21, 1961 [id number]   CSW spoke with pt who reports she is doing "better". She has started working a few hours and had an enjoyable trip to [place].  She also reports being able to get her Flonase filled.  Pt denies any concerns or needs at this time; she does ask for "prayers for my unborn grandson due in December". Support offered as she voices some concerns related to his development/status.   CSW will plan a follow up call to check in in 7-10 business days.   Eduard Clos, MSW, [place] Worker  [place] [phone]

## 2019-07-06 ENCOUNTER — Other Ambulatory Visit: Payer: Self-pay | Admitting: Pharmacist

## 2019-07-06 DIAGNOSIS — E785 Hyperlipidemia, unspecified: Secondary | ICD-10-CM | POA: Diagnosis not present

## 2019-07-06 DIAGNOSIS — I10 Essential (primary) hypertension: Secondary | ICD-10-CM | POA: Diagnosis not present

## 2019-07-06 DIAGNOSIS — I420 Dilated cardiomyopathy: Secondary | ICD-10-CM | POA: Diagnosis not present

## 2019-07-07 ENCOUNTER — Ambulatory Visit: Payer: Self-pay | Admitting: *Deleted

## 2019-07-07 NOTE — Patient Outreach (Signed)
[place] [facility]) Care Management [place]  07/07/2019  Sara Mcmahon March 01, 1961 [id number]  Reason for referral: medication assistance   Patient continues to use Flonase nasal spray for allergies with great success.  She reports decreased allergy symptoms (watery eyes, wheezing, itching) and puffiness around her eyes.  RX was called in for patient to pharmacy allowing for Tier 1 copay vs $20/month OTC.  No other pharmacy needs identified.    Patient has been provided [facility] CM contact information if assistance needed in the future.     Regina Eck, PharmD, [place]  [phone]

## 2019-07-21 ENCOUNTER — Ambulatory Visit: Payer: Self-pay | Admitting: *Deleted

## 2019-07-21 ENCOUNTER — Other Ambulatory Visit: Payer: Self-pay | Admitting: *Deleted

## 2019-07-21 NOTE — Patient Outreach (Signed)
[place] [facility]) Care Management  07/21/2019  EVALINA TABAK [birth year] [id number]   CSW was unable to reach pt by phone today and left a HIPPA compliant voice message.  CSW will await callback or try again in 3-4 business days.   Eduard Clos, MSW, [place] Worker  [place] [phone]

## 2019-07-27 ENCOUNTER — Ambulatory Visit: Payer: Self-pay | Admitting: *Deleted

## 2019-07-27 ENCOUNTER — Other Ambulatory Visit: Payer: Self-pay | Admitting: *Deleted

## 2019-07-27 ENCOUNTER — Encounter: Payer: Self-pay | Admitting: *Deleted

## 2019-07-27 NOTE — Patient Outreach (Signed)
[facility]) Care Management  07/27/2019  Sara Mcmahon Jan 12, 1961 [id number]   CSW was unable to make contact with pt today and left a HIPPA compliant voice message.  CSW will plan case closure given policy of 3 outreach attempts in 10 business days.  CSW will advise [facility] team and PCP and send pt a closure letter by mail.   Eduard Clos, MSW, [place] Worker  [place] [phone]

## 2019-08-01 ENCOUNTER — Other Ambulatory Visit: Payer: Self-pay | Admitting: *Deleted

## 2019-08-01 MED ORDER — HYDROCHLOROTHIAZIDE 12.5 MG PO CAPS: 12.5000 mg | ORAL_CAPSULE | ORAL | 3 refills | Status: DC

## 2019-08-02 ENCOUNTER — Telehealth: Payer: Self-pay | Admitting: Cardiovascular Disease

## 2019-08-02 NOTE — Telephone Encounter (Signed)
Not certain carvedilol cessation was cause of "exhaustion". Would have her see her PCP as well.

## 2019-08-02 NOTE — Telephone Encounter (Signed)
Patient informed and verbalized understanding of plan. 

## 2019-08-02 NOTE — Telephone Encounter (Signed)
Coreg- patient started taking one a day again. She was exhausted since stopping medication so she started herself back on it last night.

## 2019-08-16 ENCOUNTER — Other Ambulatory Visit (INDEPENDENT_AMBULATORY_CARE_PROVIDER_SITE_OTHER): Payer: Self-pay | Admitting: Internal Medicine

## 2019-08-16 MED ORDER — ROSUVASTATIN CALCIUM 5 MG PO TABS: 5.0000 mg | ORAL_TABLET | Freq: Every day | ORAL | 0 refills | Status: DC

## 2019-08-16 MED ORDER — POTASSIUM CHLORIDE ER 10 MEQ PO CPCR: 10.0000 meq | ORAL_CAPSULE | Freq: Every day | ORAL | 3 refills | Status: DC

## 2019-08-24 ENCOUNTER — Other Ambulatory Visit (INDEPENDENT_AMBULATORY_CARE_PROVIDER_SITE_OTHER): Payer: Self-pay | Admitting: Internal Medicine

## 2019-08-24 MED ORDER — ALPRAZOLAM 0.5 MG PO TABS: 0.5000 mg | ORAL_TABLET | Freq: Every evening | ORAL | 0 refills | Status: DC | PRN

## 2019-09-26 ENCOUNTER — Other Ambulatory Visit (INDEPENDENT_AMBULATORY_CARE_PROVIDER_SITE_OTHER): Payer: Self-pay | Admitting: Internal Medicine

## 2019-10-17 ENCOUNTER — Encounter (INDEPENDENT_AMBULATORY_CARE_PROVIDER_SITE_OTHER): Payer: PPO | Admitting: Nurse Practitioner

## 2019-10-17 ENCOUNTER — Encounter (INDEPENDENT_AMBULATORY_CARE_PROVIDER_SITE_OTHER): Payer: PPO | Admitting: Internal Medicine

## 2019-10-19 ENCOUNTER — Ambulatory Visit (INDEPENDENT_AMBULATORY_CARE_PROVIDER_SITE_OTHER): Payer: PPO | Admitting: Internal Medicine

## 2019-10-19 ENCOUNTER — Encounter (INDEPENDENT_AMBULATORY_CARE_PROVIDER_SITE_OTHER): Payer: Self-pay | Admitting: Internal Medicine

## 2019-10-19 ENCOUNTER — Other Ambulatory Visit: Payer: Self-pay

## 2019-10-19 VITALS — BP 120/80 | HR 72 | Ht 64.0 in | Wt 139.6 lb

## 2019-10-19 DIAGNOSIS — G8929 Other chronic pain: Secondary | ICD-10-CM

## 2019-10-19 DIAGNOSIS — E782 Mixed hyperlipidemia: Secondary | ICD-10-CM

## 2019-10-19 DIAGNOSIS — I5181 Takotsubo syndrome: Secondary | ICD-10-CM | POA: Diagnosis not present

## 2019-10-19 DIAGNOSIS — E7800 Pure hypercholesterolemia, unspecified: Secondary | ICD-10-CM | POA: Insufficient documentation

## 2019-10-19 DIAGNOSIS — E559 Vitamin D deficiency, unspecified: Secondary | ICD-10-CM

## 2019-10-19 DIAGNOSIS — M25512 Pain in left shoulder: Secondary | ICD-10-CM

## 2019-10-19 DIAGNOSIS — Z1159 Encounter for screening for other viral diseases: Secondary | ICD-10-CM

## 2019-10-19 DIAGNOSIS — Z0001 Encounter for general adult medical examination with abnormal findings: Secondary | ICD-10-CM

## 2019-10-19 DIAGNOSIS — E785 Hyperlipidemia, unspecified: Secondary | ICD-10-CM

## 2019-10-19 DIAGNOSIS — I10 Essential (primary) hypertension: Secondary | ICD-10-CM | POA: Diagnosis not present

## 2019-10-19 HISTORY — DX: Hyperlipidemia, unspecified: E78.5

## 2019-10-19 HISTORY — DX: Takotsubo syndrome: I51.81

## 2019-10-19 MED ORDER — NITROGLYCERIN 0.4 MG SL SUBL: 0.4000 mg | SUBLINGUAL_TABLET | SUBLINGUAL | 3 refills | Status: DC | PRN

## 2019-10-19 MED ORDER — ROSUVASTATIN CALCIUM 5 MG PO TABS: 5.0000 mg | ORAL_TABLET | Freq: Every day | ORAL | 3 refills | Status: DC

## 2019-10-19 NOTE — Progress Notes (Signed)
Chief Complaint: This 58 year old lady comes in for an annual physical exam and to address her chronic conditions which are described below. HPI: She has cardiomyopathy which is being followed by cardiology.  Her last echocardiogram was in 2017 in which showed ejection fraction of 55 to 60%.  She feels well and is not dyspneic at rest, sometimes get dyspneic on exertion but she denies any PND or orthopnea. She also has hypertension and takes medications for this. She has vitamin D deficiency and takes vitamin D3 supplementation. She has hyperlipidemia and takes statin therapy.  She denies any myalgias. Today she is also complaining of left shoulder pain which she has had for several months now.  Usually this occurs when she extends her left shoulder.  She has not seen anyone about this.  Past Medical History:  Diagnosis Date  . Cardiomyopathy ([place])   . CHF (congestive heart failure) ([place])   . Essential hypertension, benign 10/19/2019  . Fractures   . GERD (gastroesophageal reflux disease)   . Hepatitis   . HLD (hyperlipidemia) 10/19/2019  . Hypercholesteremia   . Insomnia   . Takotsubo cardiomyopathy 10/19/2019  . Vitiligo    Past Surgical History:  Procedure Laterality Date  . CARDIAC CATHETERIZATION    . CARDIAC CATHETERIZATION    . 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   She tells me that she is on disability secondary to her cardiomyopathy.    Social History   Tobacco Use  . Smoking status: Never Smoker  . Smokeless tobacco: Never Used  Substance Use Topics  . Alcohol use: Never    Alcohol/week: 0.0 standard drinks    Frequency: Never      Allergies:  Allergies  Allergen Reactions  . Lasix [Furosemide] Other (See Comments)    Headache, dizziness & throat irritation  . Pollen Extract     Trees, grass, flowers     Current Meds  Medication Sig  . ALPRAZolam (XANAX) 0.5  MG tablet TAKE 1 TABLET(0.5 MG) BY MOUTH AT BEDTIME AS NEEDED FOR ANXIETY  . carvedilol (COREG) 3.125 MG tablet Take 3.125 mg by mouth daily.  . cholecalciferol (VITAMIN D) 1000 units tablet Take 2,000 Units by mouth daily.  . fluticasone (FLONASE) 50 MCG/ACT nasal spray Place into both nostrils daily.  Marland Kitchen MAGNESIUM ASPARTATE PO Take 1 tablet by mouth daily.  . Multiple Vitamins-Minerals (MULTIVITAMIN WITH MINERALS) tablet Take 1 tablet by mouth daily.  . nitroGLYCERIN (NITROSTAT) 0.4 MG SL tablet Place 1 tablet (0.4 mg total) under the tongue every 5 (five) minutes as needed for chest pain.  . potassium chloride (MICRO-K) 10 MEQ CR capsule Take 1 capsule (10 mEq total) by mouth daily.  . rosuvastatin (CRESTOR) 5 MG tablet Take 1 tablet (5 mg total) by mouth daily.  . [DISCONTINUED] nitroGLYCERIN (NITROSTAT) 0.4 MG SL tablet Place 1 tablet (0.4 mg total) under the tongue every 5 (five) minutes as needed for chest pain.  . [DISCONTINUED] rosuvastatin (CRESTOR) 5 MG tablet Take 1 tablet (5 mg total) by mouth daily.      GTX:MIWOE from the symptoms mentioned above,there are no other symptoms referable to all systems reviewed.  Physical Exam: Blood pressure 120/80, pulse 72, height '5\' 4"'  (1.626 m), weight 139 lb 9.6 oz (63.3 kg). Vitals with BMI 10/19/2019 06/15/2019 07/16/2018  Height '5\' 4"'  '5\' 4"'  -  Weight 139 lbs 10 oz 142 lbs 6  oz -  BMI 82.88 33.74 -  Systolic 451 460 479  Diastolic 80 74 84  Pulse 72 70 64      She looks systemically well. General: Alert, cooperative, and appears to be the stated age.No pallor.  No jaundice.  No clubbing. Head: Normocephalic Eyes: Sclera white, pupils equal and reactive to light, red reflex x 2,  Ears: Normal bilaterally Oral cavity: Lips, mucosa, and tongue normal: Teeth and gums normal Neck: No adenopathy, supple, symmetrical, trachea midline, and thyroid does not appear enlarged. Breast: No masses felt. Respiratory: Clear to auscultation  bilaterally.No wheezing, crackles or bronchial breathing. Cardiovascular: Heart sounds are present and appear to be normal without murmurs or added sounds.  No carotid bruits.  Peripheral pulses are present and equal bilaterally.: Gastrointestinal:positive bowel sounds, no hepatosplenomegaly.  No masses felt.No tenderness. Skin: Clear, No rashes noted.No worrisome skin lesions seen.  Vitiligo throughout her body. Neurological: Grossly intact without focal findings, cranial nerves II through XII intact, muscle strength equal bilaterally Musculoskeletal: No acute joint abnormalities noted.Full range of movement noted with joints. Psychiatric: Affect appropriate, non-anxious.    Assessment  1. Encounter for hepatitis C screening test for low risk patient   2. Essential hypertension, benign   3. Takotsubo cardiomyopathy   4. Mixed hyperlipidemia   5. Encounter for general adult medical examination with abnormal findings   6. Chronic left shoulder pain   7. Vitamin D deficiency disease     Tests Ordered:   Orders Placed This Encounter  Procedures  . CBC  . CMP with eGFR([facility])  . Lipid Panel  . T3, Free  . T4  . TSH  . Vitamin D, 25-hydroxy  . Hep C Antibody  . Ambulatory referral to Orthopedic Surgery  . ECHOCARDIOGRAM COMPLETE     Plan  1. She will continue with all medications for her hypertension and cardiomyopathy.  These appear to be stable.  I will order echocardiogram as she has not had one for 3 years. 2. She will continue with statin therapy for hyperlipidemia.  She is tolerating this and we will check blood work today. 3. I will refer her to orthopedics for the left shoulder pain. 4. Blood work is ordered as above. 5. Further recommendations will depend on blood results and I have refilled her Crestor and Nitrostat today. 6. She will follow-up with Judson Roch for an annual Medicare wellness visit in about 3 months time. 7. Today, in addition to a preventative visit, I  performed an office visit to address her chronic conditions and symptoms above.     Meds ordered this encounter  Medications  . rosuvastatin (CRESTOR) 5 MG tablet    Sig: Take 1 tablet (5 mg total) by mouth daily.    Dispense:  30 tablet    Refill:  3  . nitroGLYCERIN (NITROSTAT) 0.4 MG SL tablet    Sig: Place 1 tablet (0.4 mg total) under the tongue every 5 (five) minutes as needed for chest pain.    Dispense:  25 tablet    Refill:  3     Nimish C [facility]   10/19/2019, 4:31 PM

## 2019-10-20 LAB — CBC
HCT: 37.9 % (ref 35.0–45.0)
Hemoglobin: 12.7 g/dL (ref 11.7–15.5)
MCH: 30.8 pg (ref 27.0–33.0)
MCHC: 33.5 g/dL (ref 32.0–36.0)
MCV: 92 fL (ref 80.0–100.0)
MPV: 10.5 fL (ref 7.5–12.5)
Platelets: 275 10*3/uL (ref 140–400)
RBC: 4.12 10*6/uL (ref 3.80–5.10)
RDW: 12.4 % (ref 11.0–15.0)
WBC: 7 10*3/uL (ref 3.8–10.8)

## 2019-10-20 LAB — LIPID PANEL
Cholesterol: 157 mg/dL (ref <200)
HDL: 47 mg/dL — ABNORMAL LOW (ref >=50)
LDL Cholesterol (Calc): 82 mg/dL (calc)
Non-HDL Cholesterol (Calc): 110 mg/dL (calc) (ref <130)
Total CHOL/HDL Ratio: 3.3 (calc) (ref <5.0)
Triglycerides: 185 mg/dL — ABNORMAL HIGH (ref <150)

## 2019-10-20 LAB — COMPLETE METABOLIC PANEL WITH GFR
AG Ratio: 1.6 (calc) (ref 1.0–2.5)
ALT: 15 U/L (ref 6–29)
AST: 22 U/L (ref 10–35)
Albumin: 4.5 g/dL (ref 3.6–5.1)
Alkaline phosphatase (APISO): 58 U/L (ref 37–153)
BUN: 15 mg/dL (ref 7–25)
CO2: 29 mmol/L (ref 20–32)
Calcium: 10.2 mg/dL (ref 8.6–10.4)
Chloride: 101 mmol/L (ref 98–110)
Creat: 0.8 mg/dL (ref 0.50–0.99)
GFR, Est African American: 93 mL/min/{1.73_m2} (ref >=60)
GFR, Est Non African American: 80 mL/min/{1.73_m2} (ref >=60)
Globulin: 2.8 g/dL (calc) (ref 1.9–3.7)
Glucose, Bld: 97 mg/dL (ref 65–99)
Potassium: 3.8 mmol/L (ref 3.5–5.3)
Sodium: 139 mmol/L (ref 135–146)
Total Bilirubin: 0.3 mg/dL (ref 0.2–1.2)
Total Protein: 7.3 g/dL (ref 6.1–8.1)

## 2019-10-20 LAB — T3, FREE: T3, Free: 2.9 pg/mL (ref 2.3–4.2)

## 2019-10-20 LAB — HEPATITIS C ANTIBODY
Hepatitis C Ab: NONREACTIVE
SIGNAL TO CUT-OFF: 0.02 (ref <1.00)

## 2019-10-20 LAB — T4: T4, Total: 6.3 ug/dL (ref 5.1–11.9)

## 2019-10-20 LAB — TSH: TSH: 1.34 mIU/L (ref 0.40–4.50)

## 2019-10-20 LAB — VITAMIN D 25 HYDROXY (VIT D DEFICIENCY, FRACTURES): Vit D, 25-Hydroxy: 95 ng/mL (ref 30–100)

## 2019-10-26 ENCOUNTER — Other Ambulatory Visit (HOSPITAL_COMMUNITY): Payer: PPO

## 2019-10-26 ENCOUNTER — Other Ambulatory Visit: Payer: Self-pay

## 2019-10-26 ENCOUNTER — Ambulatory Visit (HOSPITAL_COMMUNITY)
Admission: RE | Admit: 2019-10-26 | Discharge: 2019-10-26 | Disposition: A | Discharge status: Home / Self Care | Payer: PPO | Source: Ambulatory Visit | Attending: Internal Medicine | Admitting: Internal Medicine

## 2019-10-26 DIAGNOSIS — I5181 Takotsubo syndrome: Secondary | ICD-10-CM | POA: Insufficient documentation

## 2019-10-26 NOTE — Progress Notes (Signed)
*  PRELIMINARY RESULTS* Echocardiogram 2D Echocardiogram has been performed.  Samuel Germany 10/26/2019, 3:51 PM

## 2019-11-09 ENCOUNTER — Encounter (INDEPENDENT_AMBULATORY_CARE_PROVIDER_SITE_OTHER): Payer: PPO | Admitting: Internal Medicine

## 2019-11-14 ENCOUNTER — Encounter: Payer: Self-pay | Admitting: Radiology

## 2019-11-22 ENCOUNTER — Other Ambulatory Visit: Payer: Self-pay | Admitting: Cardiovascular Disease

## 2019-11-27 ENCOUNTER — Other Ambulatory Visit (INDEPENDENT_AMBULATORY_CARE_PROVIDER_SITE_OTHER): Payer: Self-pay | Admitting: Internal Medicine

## 2019-12-20 ENCOUNTER — Other Ambulatory Visit (INDEPENDENT_AMBULATORY_CARE_PROVIDER_SITE_OTHER): Payer: Self-pay | Admitting: Internal Medicine

## 2020-01-24 ENCOUNTER — Other Ambulatory Visit: Payer: Self-pay

## 2020-01-24 ENCOUNTER — Ambulatory Visit (INDEPENDENT_AMBULATORY_CARE_PROVIDER_SITE_OTHER): Payer: Medicare Other | Admitting: Nurse Practitioner

## 2020-01-24 ENCOUNTER — Encounter (INDEPENDENT_AMBULATORY_CARE_PROVIDER_SITE_OTHER): Payer: Self-pay | Admitting: Nurse Practitioner

## 2020-01-24 VITALS — BP 98/78 | HR 73 | Temp 98.4°F | Ht 64.0 in | Wt 140.8 lb

## 2020-01-24 DIAGNOSIS — Z0000 Encounter for general adult medical examination without abnormal findings: Secondary | ICD-10-CM

## 2020-01-24 DIAGNOSIS — M79671 Pain in right foot: Secondary | ICD-10-CM

## 2020-01-24 MED ORDER — PREDNISONE 20 MG PO TABS: 40.0000 mg | ORAL_TABLET | Freq: Every day | ORAL | 0 refills | Status: DC

## 2020-01-24 MED ORDER — MONTELUKAST SODIUM 10 MG PO TABS: 10.0000 mg | ORAL_TABLET | Freq: Every day | ORAL | 0 refills | Status: DC

## 2020-01-24 NOTE — Progress Notes (Signed)
Subjective:   Sara Mcmahon is a 59 y.o. female who presents for Medicare Annual (Subsequent) preventive examination.  Review of Systems:  Patient does describe some mild to moderate pain of her right foot.  She tells me the pain waxes and wanes.  Walking and movement makes the pain worse.  Rest and NSAIDs make the pain better.  She denies any skin changes, sores, or rashes to her foot.  She does note some intermittent swelling of her right leg.  The pain is located on the arch of her foot and radiates down to her ankle.  She describes it as a soreness. Cardiac Risk Factors include: advanced age (>58men, >38 women)     Objective:     Vitals: BP 98/78 (BP Location: Right Arm, Patient Position: Sitting, Cuff Size: Normal)   Pulse 73   Temp 98.4 F (36.9 C) (Temporal)   Ht 5\' 4"  (1.626 m)   Wt 140 lb 12.8 oz (63.9 kg)   SpO2 96%   BMI 24.17 kg/m   Body mass index is 24.17 kg/m.  Advanced Directives 01/24/2020 04/24/2019 07/16/2018 02/08/2017 10/11/2016 03/14/2016 09/11/2014  Does Patient Have a Medical Advance Directive? No No No No No No No  Would patient like information on creating a medical advance directive? No - Guardian declined No - Patient declined - - No - patient declined information - No - patient declined information    Tobacco Social History   Tobacco Use  Smoking Status Never Smoker  Smokeless Tobacco Never Used     Counseling given: Not Answered   Clinical Intake:  Pre-visit preparation completed: Yes  Pain : 0-10 Pain Score: 6  Pain Type: Chronic pain Pain Location: Foot Pain Orientation: Right Pain Radiating Towards: from the arch of the foot towards heel Pain Descriptors / Indicators: Burning, Pins and needles Pain Onset: More than a month ago Pain Frequency: Intermittent Pain Relieving Factors: Voltaren Gel Effect of Pain on Daily Activities: some  Pain Relieving Factors: Voltaren Gel  Nutritional Status: BMI of 19-24  Normal Nutritional Risks:  None Diabetes: No  How often do you need to have someone help you when you read instructions, pamphlets, or other written materials from your doctor or pharmacy?: 1 - Never What is the last grade level you completed in school?: 1 year of college  Interpreter Needed?: No  Information entered by :: [id number], CMA  Past Medical History:  Diagnosis Date  . Cardiomyopathy ([facility])   . CHF (congestive heart failure) ([facility])   . Fractures   . GERD (gastroesophageal reflux disease)   . Hepatitis   . HLD (hyperlipidemia) 10/19/2019  . Hypercholesteremia   . Insomnia   . Takotsubo cardiomyopathy 10/19/2019  . Vitiligo    Past Surgical History:  Procedure Laterality Date  . CARDIAC CATHETERIZATION    . CARDIAC CATHETERIZATION    . CESAREAN SECTION     Family History  Problem Relation Age of Onset  . Hypertension Mother   . Cancer Mother   . Heart disease Father   . Hypertension Father    Social History   Socioeconomic History  . Marital status: Widowed    Spouse name: Not on file  . Number of children: Not on file  . Years of education: Not on file  . Highest education level: Not on file  Occupational History  . Occupation: parttime    Comment13/11/2019  Tobacco Use  . Smoking status: Never Smoker  . Smokeless tobacco: Never Used  Substance and Sexual Activity  . Alcohol use: Never    Alcohol/week: 0.0 standard drinks  . Drug use: No  . Sexual activity: Not on file  Other Topics Concern  . 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   She tells me that she is on disability secondary to her cardiomyopathy.   Social Determinants of Health   Financial Resource Strain:   . Difficulty of Paying Living Expenses: Not on file  Food Insecurity:   . Worried About Charity fundraiser in the Last Year: Not on file  . Ran Out of Food in the Last Year: Not on file  Transportation Needs:   . Lack of Transportation  (Medical): Not on file  . Lack of Transportation (Non-Medical): Not on file  Physical Activity:   . Days of Exercise per Week: Not on file  . Minutes of Exercise per Session: Not on file  Stress:   . Feeling of Stress : Not on file  Social Connections:   . Frequency of Communication with Friends and Family: Not on file  . Frequency of Social Gatherings with Friends and Family: Not on file  . Attends Religious Services: Not on file  . Active Member of Clubs or Organizations: Not on file  . Attends Archivist Meetings: Not on file  . Marital Status: Not on file    Outpatient Encounter Medications as of 01/24/2020  Medication Sig  . ALPRAZolam (XANAX) 0.5 MG tablet TAKE 1 TABLET(0.5 MG) BY MOUTH AT BEDTIME AS NEEDED FOR ANXIETY  . carvedilol (COREG) 3.125 MG tablet TAKE 1 TABLET(3.125 MG) BY MOUTH TWICE DAILY WITH A MEAL  . cholecalciferol (VITAMIN D) 1000 units tablet Take 2,000 Units by mouth daily.  . fluticasone (FLONASE) 50 MCG/ACT nasal spray Place into both nostrils daily.  Marland Kitchen MAGNESIUM ASPARTATE PO Take 1 tablet by mouth daily.  . Multiple Vitamins-Minerals (MULTIVITAMIN WITH MINERALS) tablet Take 1 tablet by mouth daily.  . nitroGLYCERIN (NITROSTAT) 0.4 MG SL tablet Place 1 tablet (0.4 mg total) under the tongue every 5 (five) minutes as needed for chest pain.  . potassium chloride (MICRO-K) 10 MEQ CR capsule TAKE 1 CAPSULE(10 MEQ) BY MOUTH DAILY  . rosuvastatin (CRESTOR) 5 MG tablet Take 1 tablet (5 mg total) by mouth daily.  . [DISCONTINUED] potassium chloride (MICRO-K) 10 MEQ CR capsule Take 1 capsule (10 mEq total) by mouth daily.   No facility-administered encounter medications on file as of 01/24/2020.    Activities of Daily Living In your present state of health, do you have any difficulty performing the following activities: 01/24/2020 04/24/2019  Hearing? N N  Vision? N N  Difficulty concentrating or making decisions? N N  Walking or climbing stairs? N N    Dressing or bathing? N N  Doing errands, shopping? N N  Preparing Food and eating ? N N  Using the Toilet? N N  In the past six months, have you accidently leaked urine? N N  Do you have problems with loss of bowel control? N N  Managing your Medications? N N  Managing your Finances? N N  Housekeeping or managing your Housekeeping? N N  Some recent data might be hidden    Patient Care Team: Doree Albee, MD as PCP - General (Internal Medicine) Herminio Commons, MD as PCP - Cardiology (Cardiology)    Assessment:   This is a routine wellness examination for Wilder.  Exercise Activities  and Dietary recommendations Current Exercise Habits: Home exercise routine, Type of exercise: treadmill, Time (Minutes): 15, Frequency (Times/Week): 3, Weekly Exercise (Minutes/Week): 45, Intensity: Mild  Goals   None     Fall Risk Fall Risk  01/24/2020 04/24/2019  Falls in the past year? 0 0  Number falls in past yr: 0 0  Injury with Fall? 0 0  Risk for fall due to : No Fall Risks -  Follow up Falls prevention discussed -   Is the patient's home free of loose throw rugs in walkways, pet beds, electrical cords, etc?   yes      Grab bars in the bathroom? no -has one that is not stable in one of her showers      Handrails on the stairs?   yes      Adequate lighting?   yes  Timed Get Up and Go performed: 4 seconds  Depression Screen PHQ 2/9 Scores 01/24/2020 04/24/2019  PHQ - 2 Score 0 1  Exception Documentation Medical reason -     Cognitive Function     6CIT Screen 01/24/2020  What Year? 0 points  What month? 0 points  What time? 0 points  Count back from 20 0 points  Months in reverse 0 points  Repeat phrase 0 points  Total Score 0     There is no immunization history on file for this patient. Patient refusing all immunizations at this time.   Qualifies for Shingles Vaccine? Yes  Screening Tests Health Maintenance  Topic Date Due  . PAP SMEAR-Modifier  12/12/1979   . COLONOSCOPY  12/11/2008  . MAMMOGRAM  11/17/2013  . INFLUENZA VACCINE  07/08/2019  . TETANUS/TDAP  12/08/2019  . Hepatitis C Screening  Completed  . HIV Screening  Completed    Cancer Screenings: Lung: Low Dose CT Chest recommended if Age 86-80 years, 30 pack-year currently smoking OR have quit w/in 15years. Patient does not qualify. Breast:  Up to date on Mammogram? No   Up to date of Bone Density/Dexa? No -not applicable Colorectal: Overdue  Additional Screenings:  Hepatitis C Screening: Completed and was negative in November 2020     Plan:   Patient is due for all vaccinations, but would like to hold off on these for now.  She is also due for cervical cancer screening, colon cancer screening, breast cancer screening, but has opted to hold off on these as well.  She has declined further information regarding advance care planning.    I have personally reviewed and noted the following in the patient's chart:   . Medical and social history . Use of alcohol, tobacco or illicit drugs  . Current medications and supplements . Functional ability and status . Nutritional status . Physical activity . Advanced directives . List of other physicians . Hospitalizations, surgeries, and ER visits in previous 12 months . Vitals . Screenings to include cognitive, depression, and falls . Referrals and appointments  In addition, I have reviewed and discussed with patient certain preventive protocols, quality metrics, and best practice recommendations. A written personalized care plan for preventive services as well as general preventive health recommendations were provided to patient.    In regards to her foot pain I will prescribe her a short course of steroids and refer her to podiatry.  I have given her education regarding steroids and their side effects and recommended that she take it in the morning with food.  She will follow-up in 3 months or sooner as needed. [facility]  Doristine Church,  NP  01/24/2020

## 2020-01-24 NOTE — Patient Instructions (Signed)
Thank you for choosing [facility] as your medical provider! If you have any questions or concerns regarding your health care, please do not hesitate to call our office.  Continue medications as prescribed.  Please let me know if you would like any immunizations that we discussed in this office visit today.  Please also let us know if you would like referrals for screening tests including colonoscopy, Pap smear, and mammogram.  I will refer you to podiatry as well as prescribed short course of steroids to help with your foot pain.  Please follow-up as scheduled in 3 months. We look forward to seeing you again soon!   At [facility] we value your feedback. You may receive a survey about your visit today. Please share your experience as we strive to create trusting relationships with our patients to provide genuine, compassionate, quality care.  We appreciate your understanding and patience as we review any laboratory studies, imaging, and other diagnostic tests that are ordered as we care for you. We do our best to address any and all results in a timely manner. If you do not hear about test results within 1 week, please do not hesitate to contact us. If we referred you to a specialist during your visit or ordered imaging testing, contact the office if you have not been contacted to be scheduled within 1 weeks.  We also encourage the use of MyChart, which contains your medical information for your review as well. If you are not enrolled in this feature, an access code is on this after visit summary for your convenience. Thank you for allowing [place] to be involved in your care.

## 2020-03-03 ENCOUNTER — Other Ambulatory Visit (INDEPENDENT_AMBULATORY_CARE_PROVIDER_SITE_OTHER): Payer: Self-pay | Admitting: Internal Medicine

## 2020-03-06 ENCOUNTER — Encounter (INDEPENDENT_AMBULATORY_CARE_PROVIDER_SITE_OTHER): Payer: PPO | Admitting: Nurse Practitioner

## 2020-03-13 ENCOUNTER — Other Ambulatory Visit (INDEPENDENT_AMBULATORY_CARE_PROVIDER_SITE_OTHER): Payer: Self-pay | Admitting: Internal Medicine

## 2020-04-12 ENCOUNTER — Other Ambulatory Visit (INDEPENDENT_AMBULATORY_CARE_PROVIDER_SITE_OTHER): Payer: Self-pay | Admitting: Internal Medicine

## 2020-04-24 ENCOUNTER — Encounter (INDEPENDENT_AMBULATORY_CARE_PROVIDER_SITE_OTHER): Payer: Self-pay | Admitting: Internal Medicine

## 2020-04-24 ENCOUNTER — Ambulatory Visit (INDEPENDENT_AMBULATORY_CARE_PROVIDER_SITE_OTHER): Payer: Medicare Other | Admitting: Internal Medicine

## 2020-04-24 ENCOUNTER — Other Ambulatory Visit: Payer: Self-pay

## 2020-04-24 VITALS — BP 145/80 | HR 75 | Temp 98.1°F | Ht 64.0 in | Wt 138.0 lb

## 2020-04-24 DIAGNOSIS — M79671 Pain in right foot: Secondary | ICD-10-CM

## 2020-04-24 DIAGNOSIS — E782 Mixed hyperlipidemia: Secondary | ICD-10-CM

## 2020-04-24 DIAGNOSIS — I10 Essential (primary) hypertension: Secondary | ICD-10-CM

## 2020-04-24 MED ORDER — FLUTICASONE PROPIONATE 50 MCG/ACT NA SUSP: 1.0000 | Freq: Every day | NASAL | 6 refills | Status: DC

## 2020-04-24 MED ORDER — ALPRAZOLAM 0.5 MG PO TABS: ORAL_TABLET | ORAL | 1 refills | Status: DC

## 2020-04-24 NOTE — Progress Notes (Signed)
Metrics: Intervention Frequency ACO  Documented Smoking Status Yearly  Screened one or more times in 24 months  Cessation Counseling or  Active cessation medication Past 24 months  Past 24 months   Guideline developer: UpToDate (See UpToDate for funding source) Date Released: 2014       Wellness Office Visit  Subjective:  Patient ID: Sara Mcmahon, female    DOB: 03-Apr-1961  Age: 59 y.o. MRN: [id number]  CC: This lady comes in for follow-up regarding her hyperlipidemia, vitamin D deficiency, hypertension. HPI  She is doing very well but she is complaining of quite severe right foot pain which she has had for several weeks.  She was supposed to see a podiatrist but this never occurred. She also wonders whether she should remain on Crestor.  She has no history of coronary artery disease, cerebrovascular disease or diabetes. She is using a supplement called plexus which she says this wonderful makes her feel much younger. Past Medical History:  Diagnosis Date  . Cardiomyopathy ([facility])   . CHF (congestive heart failure) ([facility])   . Fractures   . GERD (gastroesophageal reflux disease)   . Hepatitis   . HLD (hyperlipidemia) 10/19/2019  . Hypercholesteremia   . Insomnia   . Takotsubo cardiomyopathy 10/19/2019  . Vitiligo       Family History  Problem Relation Age of Onset  . Hypertension Mother   . Cancer Mother   . Heart disease Father   . 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   Social History   Tobacco Use  . Smoking status: Never Smoker  . Smokeless tobacco: Never Used  Substance Use Topics  . Alcohol use: Never    Alcohol/week: 0.0 standard drinks    Current Meds  Medication Sig  . ALPRAZolam (XANAX) 0.5 MG tablet TAKE 1 TABLET(0.5 MG) BY MOUTH AT BEDTIME AS NEEDED FOR ANXIETY  . carvedilol (COREG) 3.125  MG tablet TAKE 1 TABLET(3.125 MG) BY MOUTH TWICE DAILY WITH A MEAL (Patient taking differently: Take 3.125 mg by mouth daily. )  . cholecalciferol (VITAMIN D) 1000 units tablet Take 2,000 Units by mouth daily.  . fluticasone (FLONASE) 50 MCG/ACT nasal spray Place 1 spray into both nostrils daily.  Marland Kitchen MAGNESIUM ASPARTATE PO Take 1 tablet by mouth daily.  . montelukast (SINGULAIR) 10 MG tablet Take 1 tablet (10 mg total) by mouth at bedtime.  . Multiple Vitamins-Minerals (MULTIVITAMIN WITH MINERALS) tablet Take 1 tablet by mouth daily.  . nitroGLYCERIN (NITROSTAT) 0.4 MG SL tablet Place 1 tablet (0.4 mg total) under the tongue every 5 (five) minutes as needed for chest pain.  . potassium chloride (MICRO-K) 10 MEQ CR capsule TAKE 1 CAPSULE(10 MEQ) BY MOUTH DAILY  . rosuvastatin (CRESTOR) 5 MG tablet TAKE 1 TABLET(5 MG) BY MOUTH DAILY  . [DISCONTINUED] ALPRAZolam (XANAX) 0.5 MG tablet TAKE 1 TABLET(0.5 MG) BY MOUTH AT BEDTIME AS NEEDED FOR ANXIETY  . [DISCONTINUED] fluticasone (FLONASE) 50 MCG/ACT nasal spray Place into both nostrils daily.       Objective:   Today's Vitals: BP (!) 145/80 (BP Location: Left Arm, Patient Position: Sitting, Cuff Size: Normal)   Pulse 75   Temp 98.1 F (36.7 C) (Temporal)   Ht 5\' 4"  (1.626 m)   Wt 138 lb (62.6 kg)   SpO2 97%   BMI 23.69 kg/m  Vitals with BMI 04/24/2020 01/24/2020 10/19/2019  Height 5\' 4"  5\' 4"  5\' 4"   Weight 138 lbs 140 lbs 13 oz 139 lbs 10 oz  BMI 23.68 46.28 63.81  Systolic 771 98 165  Diastolic 80 78 80  Pulse 75 73 72     Physical Exam  She looks systemically well.  She has lost a couple of pounds since the last visit.     Assessment   1. Right foot pain   2. Essential hypertension, benign   3. Mixed hyperlipidemia       Tests ordered Orders Placed This Encounter  Procedures  . Ambulatory referral to Podiatry     Plan: 1. I will refer to podiatry again in [place] at the [facility]. 2. We made the  decision together to stop taking Crestor. 3. I will see her back in about 4 weeks and we will check lipid panel along with vitamin D levels etc. 4. Today I refilled her Xanax and Flonase as requested.   Meds ordered this encounter  Medications  . ALPRAZolam (XANAX) 0.5 MG tablet    Sig: TAKE 1 TABLET(0.5 MG) BY MOUTH AT BEDTIME AS NEEDED FOR ANXIETY    Dispense:  30 tablet    Refill:  1  . fluticasone (FLONASE) 50 MCG/ACT nasal spray    Sig: Place 1 spray into both nostrils daily.    Dispense:  16 g    Refill:  6    Fahd Galea Luther Parody, MD

## 2020-04-29 ENCOUNTER — Other Ambulatory Visit (INDEPENDENT_AMBULATORY_CARE_PROVIDER_SITE_OTHER): Payer: Self-pay | Admitting: Nurse Practitioner

## 2020-04-29 DIAGNOSIS — Z0000 Encounter for general adult medical examination without abnormal findings: Secondary | ICD-10-CM

## 2020-05-09 ENCOUNTER — Other Ambulatory Visit (INDEPENDENT_AMBULATORY_CARE_PROVIDER_SITE_OTHER): Payer: Self-pay | Admitting: Internal Medicine

## 2020-05-21 ENCOUNTER — Ambulatory Visit: Payer: Medicare Other | Admitting: Sports Medicine

## 2020-05-21 ENCOUNTER — Other Ambulatory Visit: Payer: Self-pay

## 2020-05-21 ENCOUNTER — Ambulatory Visit (INDEPENDENT_AMBULATORY_CARE_PROVIDER_SITE_OTHER): Payer: Medicare Other

## 2020-05-21 ENCOUNTER — Encounter: Payer: Self-pay | Admitting: Sports Medicine

## 2020-05-21 VITALS — BP 131/76 | HR 65 | Temp 97.6°F | Resp 16

## 2020-05-21 DIAGNOSIS — M722 Plantar fascial fibromatosis: Secondary | ICD-10-CM

## 2020-05-21 DIAGNOSIS — Q667 Congenital pes cavus, unspecified foot: Secondary | ICD-10-CM | POA: Diagnosis not present

## 2020-05-21 DIAGNOSIS — M779 Enthesopathy, unspecified: Secondary | ICD-10-CM

## 2020-05-21 DIAGNOSIS — M79671 Pain in right foot: Secondary | ICD-10-CM | POA: Diagnosis not present

## 2020-05-21 MED ORDER — PREDNISONE 10 MG (21) PO TBPK: ORAL_TABLET | ORAL | 0 refills | Status: DC

## 2020-05-22 ENCOUNTER — Encounter: Payer: Self-pay | Admitting: Sports Medicine

## 2020-05-22 NOTE — Progress Notes (Signed)
Subjective: Sara Mcmahon is a 59 y.o. female patient presents to office with complaint of moderate heel pain on the right. Patient admits to post static dyskinesia since march in duration. Worse after wedding and tried to wear 2 inch block heels. Admits to a previous history of fracture of right foot and swelling at lateral foot on right. Patient has treated this problem with rest, gentle stretching, and supplement for inflammation with no complete relief. Denies any other pedal complaints.   Review of Systems  All other systems reviewed and are negative.  Patient Active Problem List   Diagnosis Date Noted   Essential hypertension, benign 10/19/2019   Takotsubo cardiomyopathy 10/19/2019   HLD (hyperlipidemia) 10/19/2019    Current Outpatient Medications on File Prior to Visit  Medication Sig Dispense Refill   ALPRAZolam (XANAX) 0.5 MG tablet TAKE 1 TABLET(0.5 MG) BY MOUTH AT BEDTIME AS NEEDED FOR ANXIETY 30 tablet 1   carvedilol (COREG) 3.125 MG tablet TAKE 1 TABLET(3.125 MG) BY MOUTH TWICE DAILY WITH A MEAL (Patient taking differently: Take 3.125 mg by mouth daily. Takes 1 tablet at night time) 180 tablet 2   cholecalciferol (VITAMIN D) 1000 units tablet Take 2,000 Units by mouth daily.     fluticasone (FLONASE) 50 MCG/ACT nasal spray Place 1 spray into both nostrils daily. 16 g 6   MAGNESIUM ASPARTATE PO Take 1 tablet by mouth daily.     montelukast (SINGULAIR) 10 MG tablet TAKE 1 TABLET(10 MG) BY MOUTH AT BEDTIME 90 tablet 0   Multiple Vitamins-Minerals (MULTIVITAMIN WITH MINERALS) tablet Take 1 tablet by mouth daily.     nitroGLYCERIN (NITROSTAT) 0.4 MG SL tablet Place 1 tablet (0.4 mg total) under the tongue every 5 (five) minutes as needed for chest pain. 25 tablet 3   potassium chloride (MICRO-K) 10 MEQ CR capsule TAKE 1 CAPSULE(10 MEQ) BY MOUTH DAILY 30 capsule 3   [DISCONTINUED] potassium chloride (MICRO-K) 10 MEQ CR capsule Take 1 capsule (10 mEq total) by mouth  daily. 30 capsule 3   No current facility-administered medications on file prior to visit.    Allergies  Allergen Reactions   Lasix [Furosemide] Other (See Comments)    Headache, dizziness & throat irritation   Pollen Extract     Trees, grass, flowers    Objective: Physical Exam General: The patient is alert and oriented x3 in no acute distress.  Dermatology: Skin is warm, dry and supple bilateral lower extremities. Nails 1-10 are normal. There is no erythema, edema, no eccymosis, no open lesions present.  Hypopigmented patches of skin consistent with vitiligo.  Integument is otherwise unremarkable.  Vascular: Dorsalis Pedis pulse and Posterior Tibial pulse are 2/4 bilateral. Capillary fill time is immediate to all digits.  Neurological: Grossly intact to light touch with an achilles reflex of +2/5 and a  negative Tinel's sign bilateral.  Musculoskeletal: Tenderness to palpation at the medial calcaneal tubercale and through the insertion of the plantar fascia on the right foot.  There is minimal pain pain to palpation to the peroneal tendon insertion at the right fifth metatarsal base.  No pain with compression of calcaneus bilateral.  There is decreased Ankle joint range of motion bilateral. All other joints range of motion within normal limits bilateral. Strength 5/5 in all groups bilateral.   Gait: Unassisted, Antalgic avoid weight on right heel  Xray, Right foot:  Normal osseous mineralization. Joint spaces preserved.  Increased calcaneal inclination angle consistent with pes cavus foot type.  No fracture/dislocation/boney destruction.  Minimal calcaneal spur present with mild thickening of plantar fascia. No other soft tissue abnormalities or radiopaque foreign bodies.   Assessment and Plan: Problem List Items Addressed This Visit    None    Visit Diagnoses    Right foot pain    -  Primary   Relevant Orders   DG Foot Complete Right   Plantar fasciitis, right        Tendonitis       Pes cavus          -Complete examination performed.  -Xrays reviewed -Discussed with patient in detail the condition of plantar fasciitis, how this occurs and general treatment options. Explained both conservative and surgical treatments.  -Patient was very hesitant about injection at this time deferred but encourage patient if pain is still there at next visit would highly encourage her to get an injection -Rx prednisone 10 mg dose pack -Recommended good supportive shoes and advised use of OTC heel lift to dispense this visit and advised patient to avoid walking barefoot. -Explained and dispensed to patient daily stretching exercises. -Recommend patient to ice affected area 1-2x daily. -Patient to return to office in 3-4 weeks for follow up or sooner if problems or questions arise.  Asencion Islam, DPM

## 2020-05-23 ENCOUNTER — Other Ambulatory Visit: Payer: Self-pay | Admitting: Sports Medicine

## 2020-05-23 DIAGNOSIS — M779 Enthesopathy, unspecified: Secondary | ICD-10-CM

## 2020-05-23 DIAGNOSIS — M722 Plantar fascial fibromatosis: Secondary | ICD-10-CM

## 2020-05-29 ENCOUNTER — Ambulatory Visit (INDEPENDENT_AMBULATORY_CARE_PROVIDER_SITE_OTHER): Payer: Medicare Other | Admitting: Internal Medicine

## 2020-05-29 ENCOUNTER — Encounter (INDEPENDENT_AMBULATORY_CARE_PROVIDER_SITE_OTHER): Payer: Self-pay | Admitting: Internal Medicine

## 2020-05-29 ENCOUNTER — Other Ambulatory Visit: Payer: Self-pay

## 2020-05-29 VITALS — BP 140/80 | HR 86 | Temp 97.5°F | Resp 18 | Ht 65.0 in | Wt 138.0 lb

## 2020-05-29 DIAGNOSIS — I10 Essential (primary) hypertension: Secondary | ICD-10-CM

## 2020-05-29 DIAGNOSIS — E559 Vitamin D deficiency, unspecified: Secondary | ICD-10-CM | POA: Diagnosis not present

## 2020-05-29 DIAGNOSIS — E782 Mixed hyperlipidemia: Secondary | ICD-10-CM

## 2020-05-29 NOTE — Progress Notes (Signed)
Metrics: Intervention Frequency ACO  Documented Smoking Status Yearly  Screened one or more times in 24 months  Cessation Counseling or  Active cessation medication Past 24 months  Past 24 months   Guideline developer: UpToDate (See UpToDate for funding source) Date Released: 2014       Wellness Office Visit  Subjective:  Patient ID: Sara Mcmahon, female    DOB: 1961/09/27  Age: 59 y.o. MRN: [id number]  CC: This lady comes in for follow-up of hyperlipidemia, vitamin D deficiency, hypertension. HPI  She is doing extremely well and she uses a supplement called plexus.  This seems to give her lots of energy.  She feels great. The last time I saw her, we discontinued statin therapy of Crestor. She has been taking vitamin D3 2000 units daily. She continues to take carvedilol for hypertension and cardiomyopathy. Past Medical History:  Diagnosis Date  . Cardiomyopathy (HCC)   . CHF (congestive heart failure) (HCC)   . Fractures   . GERD (gastroesophageal reflux disease)   . Hepatitis   . HLD (hyperlipidemia) 10/19/2019  . Hypercholesteremia   . Insomnia   . Takotsubo cardiomyopathy 10/19/2019  . Vitiligo    Past Surgical History:  Procedure Laterality Date  . CARDIAC CATHETERIZATION    . CARDIAC CATHETERIZATION    . CESAREAN SECTION       Family History  Problem Relation Age of Onset  . Hypertension Mother   . Cancer Mother   . Heart disease Father   . 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   Social History   Tobacco Use  . Smoking status: Never Smoker  . Smokeless tobacco: Never Used  Substance Use Topics  . Alcohol use: Never    Alcohol/week: 0.0 standard drinks    Current Meds  Medication Sig  . ALPRAZolam (XANAX) 0.5 MG tablet TAKE 1 TABLET(0.5 MG) BY MOUTH AT BEDTIME AS NEEDED FOR ANXIETY  .  carvedilol (COREG) 3.125 MG tablet TAKE 1 TABLET(3.125 MG) BY MOUTH TWICE DAILY WITH A MEAL (Patient taking differently: Take 3.125 mg by mouth daily. Takes 1 tablet at night time)  . cholecalciferol (VITAMIN D) 1000 units tablet Take 2,000 Units by mouth daily.  . fluticasone (FLONASE) 50 MCG/ACT nasal spray Place 1 spray into both nostrils daily.  Marland Kitchen MAGNESIUM ASPARTATE PO Take 1 tablet by mouth daily.  . montelukast (SINGULAIR) 10 MG tablet TAKE 1 TABLET(10 MG) BY MOUTH AT BEDTIME  . Multiple Vitamins-Minerals (MULTIVITAMIN WITH MINERALS) tablet Take 1 tablet by mouth daily.  . nitroGLYCERIN (NITROSTAT) 0.4 MG SL tablet Place 1 tablet (0.4 mg total) under the tongue every 5 (five) minutes as needed for chest pain.  . potassium chloride (MICRO-K) 10 MEQ CR capsule TAKE 1 CAPSULE(10 MEQ) BY MOUTH DAILY  . predniSONE (STERAPRED UNI-PAK 21 TAB) 10 MG (21) TBPK tablet Take as directed     Depression screen [facility] 2/9 01/24/2020 04/24/2019  Decreased Interest 0 0  Down, Depressed, Hopeless 0 1  PHQ - 2 Score 0 1     Objective:   Today's Vitals: BP 140/80 (BP Location: Right Arm, Patient Position: Sitting, Cuff Size: Normal)   Pulse 86   Temp (!) 97.5 F (36.4 C) (Temporal)   Resp 18   Ht 5\' 5"  (1.651 m)   Wt 138 lb (62.6 kg)  SpO2 98%   BMI 22.96 kg/m  Vitals with BMI 05/29/2020 05/21/2020 04/24/2020  Height 5\' 5"  - 5\' 4"   Weight 138 lbs - 138 lbs  BMI 49.17 - 91.50  Systolic 569 794 801  Diastolic 80 76 80  Pulse 86 65 75     Physical Exam  She looks systemically well.  Blood pressure is stable.  Weight is stable.     Assessment   1. Mixed hyperlipidemia   2. Essential hypertension, benign   3. Vitamin D deficiency disease       Tests ordered Orders Placed This Encounter  Procedures  . COMPLETE METABOLIC PANEL WITH GFR  . Lipid panel  . VITAMIN D 25 Hydroxy (Vit-D Deficiency, Fractures)     Plan: 1. Blood work is ordered. 2. She will continue with carvedilol  for hypertension, blood pressure is well controlled. 3. She will continue with vitamin D3 supplementation and we will check levels today. 4. We will check a lipid panel to see if  she really needs statin therapy, I do not believe she does. 5. Follow-up in 3 months.   No orders of the defined types were placed in this encounter.   Doree Albee, MD

## 2020-05-30 LAB — COMPLETE METABOLIC PANEL WITH GFR
AG Ratio: 1.7 (calc) (ref 1.0–2.5)
ALT: 15 U/L (ref 6–29)
AST: 20 U/L (ref 10–35)
Albumin: 4 g/dL (ref 3.6–5.1)
Alkaline phosphatase (APISO): 51 U/L (ref 37–153)
BUN: 17 mg/dL (ref 7–25)
CO2: 28 mmol/L (ref 20–32)
Calcium: 9.4 mg/dL (ref 8.6–10.4)
Chloride: 105 mmol/L (ref 98–110)
Creat: 0.79 mg/dL (ref 0.50–0.99)
GFR, Est African American: 94 mL/min/{1.73_m2} (ref >=60)
GFR, Est Non African American: 81 mL/min/{1.73_m2} (ref >=60)
Globulin: 2.3 g/dL (calc) (ref 1.9–3.7)
Glucose, Bld: 99 mg/dL (ref 65–99)
Potassium: 4 mmol/L (ref 3.5–5.3)
Sodium: 140 mmol/L (ref 135–146)
Total Bilirubin: 0.4 mg/dL (ref 0.2–1.2)
Total Protein: 6.3 g/dL (ref 6.1–8.1)

## 2020-05-30 LAB — LIPID PANEL
Cholesterol: 225 mg/dL — ABNORMAL HIGH (ref <200)
HDL: 47 mg/dL — ABNORMAL LOW (ref >=50)
LDL Cholesterol (Calc): 147 mg/dL (calc) — ABNORMAL HIGH
Non-HDL Cholesterol (Calc): 178 mg/dL (calc) — ABNORMAL HIGH (ref <130)
Total CHOL/HDL Ratio: 4.8 (calc) (ref <5.0)
Triglycerides: 171 mg/dL — ABNORMAL HIGH (ref <150)

## 2020-05-30 LAB — VITAMIN D 25 HYDROXY (VIT D DEFICIENCY, FRACTURES): Vit D, 25-Hydroxy: 58 ng/mL (ref 30–100)

## 2020-06-06 ENCOUNTER — Telehealth (INDEPENDENT_AMBULATORY_CARE_PROVIDER_SITE_OTHER): Payer: Self-pay

## 2020-06-06 NOTE — Telephone Encounter (Signed)
Sara Mcmahon left a voicemail stating that she has stopped taking the Prednisone that Dr. Marylene Land put her on for her foot due to it causing her some chest discomfort.

## 2020-06-25 ENCOUNTER — Ambulatory Visit: Payer: Medicare Other | Admitting: Sports Medicine

## 2020-07-30 ENCOUNTER — Telehealth (INDEPENDENT_AMBULATORY_CARE_PROVIDER_SITE_OTHER): Payer: Self-pay

## 2020-07-30 NOTE — Telephone Encounter (Signed)
Called lvm if we would call her in medications for the congestion she is having. She is sure it is just allergies. Reviewed snapshot to see if pt had Covid vaccine; she has not. Pt was lvm to go to local pharmacy([facility] to locate a COVID testing center.)

## 2020-08-02 ENCOUNTER — Other Ambulatory Visit: Payer: Self-pay

## 2020-08-02 ENCOUNTER — Encounter: Payer: Self-pay | Admitting: Emergency Medicine

## 2020-08-02 ENCOUNTER — Ambulatory Visit (INDEPENDENT_AMBULATORY_CARE_PROVIDER_SITE_OTHER): Payer: Medicare Other

## 2020-08-02 ENCOUNTER — Ambulatory Visit
Admission: EM | Admit: 2020-08-02 | Discharge: 2020-08-02 | Disposition: A | Discharge status: Home / Self Care | Payer: Medicare Other | Attending: Emergency Medicine | Admitting: Emergency Medicine

## 2020-08-02 DIAGNOSIS — R0602 Shortness of breath: Secondary | ICD-10-CM | POA: Diagnosis not present

## 2020-08-02 DIAGNOSIS — J069 Acute upper respiratory infection, unspecified: Secondary | ICD-10-CM | POA: Diagnosis not present

## 2020-08-02 DIAGNOSIS — R05 Cough: Secondary | ICD-10-CM | POA: Diagnosis not present

## 2020-08-02 DIAGNOSIS — R6889 Other general symptoms and signs: Secondary | ICD-10-CM | POA: Diagnosis not present

## 2020-08-02 DIAGNOSIS — R509 Fever, unspecified: Secondary | ICD-10-CM | POA: Diagnosis not present

## 2020-08-02 DIAGNOSIS — Z1152 Encounter for screening for COVID-19: Secondary | ICD-10-CM

## 2020-08-02 MED ORDER — BENZONATATE 100 MG PO CAPS
100.0000 mg | ORAL_CAPSULE | Freq: Three times a day (TID) | ORAL | 0 refills | Status: DC
Start: 2020-08-02 — End: 2021-01-21

## 2020-08-02 MED ORDER — PREDNISONE 10 MG PO TABS: 20.0000 mg | ORAL_TABLET | Freq: Every day | ORAL | 0 refills | Status: DC

## 2020-08-02 MED ORDER — ACETAMINOPHEN 325 MG PO TABS
650.0000 mg | ORAL_TABLET | Freq: Once | ORAL | Status: AC
  Administered 2020-08-02: 650 mg via ORAL

## 2020-08-02 NOTE — ED Provider Notes (Signed)
[facility]   [id number] 08/02/20 Arrival Time: 1722   CC: COVID symptoms  SUBJECTIVE: History from: patient.  Sara Mcmahon is a 59 y.o. female with history of CHF presented to the urgent care with a complaint of cough, and SOB for the past 1 week and diarrhea for the past 3 days.  Report positive Covid exposure.  Denies recent travel.  Has tried OTC medication without relief.  Denies alleviating or aggravating factors.  Denies previous symptoms in the past.   Denies  fatigue, sinus pain, rhinorrhea, sore throat,  wheezing, chest pain, nausea, changes in bowel or bladder habits.    ROS: As per HPI.  All other pertinent ROS negative.      Past Medical History:  Diagnosis Date  . Cardiomyopathy (HCC)   . CHF (congestive heart failure) (HCC)   . Fractures   . GERD (gastroesophageal reflux disease)   . Hepatitis   . HLD (hyperlipidemia) 10/19/2019  . Hypercholesteremia   . Insomnia   . Takotsubo cardiomyopathy 10/19/2019  . Vitiligo    Past Surgical History:  Procedure Laterality Date  . CARDIAC CATHETERIZATION    . CARDIAC CATHETERIZATION    . CESAREAN SECTION     Allergies  Allergen Reactions  . Lasix [Furosemide] Other (See Comments)    Headache, dizziness & throat irritation  . Pollen Extract     Trees, grass, flowers   No current facility-administered medications on file prior to encounter.   Current Outpatient Medications on File Prior to Encounter  Medication Sig Dispense Refill  . ALPRAZolam (XANAX) 0.5 MG tablet TAKE 1 TABLET(0.5 MG) BY MOUTH AT BEDTIME AS NEEDED FOR ANXIETY 30 tablet 1  . carvedilol (COREG) 3.125 MG tablet TAKE 1 TABLET(3.125 MG) BY MOUTH TWICE DAILY WITH A MEAL (Patient taking differently: Take 3.125 mg by mouth daily. Takes 1 tablet at night time) 180 tablet 2  . cholecalciferol (VITAMIN D) 1000 units tablet Take 2,000 Units by mouth daily.    . fluticasone (FLONASE) 50 MCG/ACT nasal spray Place 1 spray into both nostrils daily.  16 g 6  . MAGNESIUM ASPARTATE PO Take 1 tablet by mouth daily.    . montelukast (SINGULAIR) 10 MG tablet TAKE 1 TABLET(10 MG) BY MOUTH AT BEDTIME 90 tablet 0  . Multiple Vitamins-Minerals (MULTIVITAMIN WITH MINERALS) tablet Take 1 tablet by mouth daily.    . nitroGLYCERIN (NITROSTAT) 0.4 MG SL tablet Place 1 tablet (0.4 mg total) under the tongue every 5 (five) minutes as needed for chest pain. 25 tablet 3  . potassium chloride (MICRO-K) 10 MEQ CR capsule TAKE 1 CAPSULE(10 MEQ) BY MOUTH DAILY 30 capsule 3  . [DISCONTINUED] potassium chloride (MICRO-K) 10 MEQ CR capsule Take 1 capsule (10 mEq total) by mouth daily. 30 capsule 3   Social History   Socioeconomic History  . Marital status: Widowed    Spouse name: Not on file  . Number of children: Not on file  . Years of education: Not on file  . Highest education level: Not on file  Occupational History  . Occupation: parttime    CommentMerchandiser, retail  Tobacco Use  . Smoking status: Never Smoker  . Smokeless tobacco: Never Used  Vaping Use  . Vaping Use: Never used  Substance and Sexual Activity  . Alcohol use: Never    Alcohol/week: 0.0 standard drinks  . Drug use: No  . Sexual activity: Not on file  Other Topics Concern  . 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   She tells me that she is on disability secondary to her cardiomyopathy.   Social Determinants of Health   Financial Resource Strain:   . Difficulty of Paying Living Expenses: Not on file  Food Insecurity:   . Worried About Programme researcher, broadcasting/film/video in the Last Year: Not on file  . Ran Out of Food in the Last Year: Not on file  Transportation Needs:   . Lack of Transportation (Medical): Not on file  . Lack of Transportation (Non-Medical): Not on file  Physical Activity:   . Days of Exercise per Week: Not on file  . Minutes of Exercise per Session: Not on file  Stress:   . Feeling of Stress : Not on file  Social  Connections:   . Frequency of Communication with Friends and Family: Not on file  . Frequency of Social Gatherings with Friends and Family: Not on file  . Attends Religious Services: Not on file  . Active Member of Clubs or Organizations: Not on file  . Attends Banker Meetings: Not on file  . Marital Status: Not on file  Intimate Partner Violence:   . Fear of Current or Ex-Partner: Not on file  . Emotionally Abused: Not on file  . Physically Abused: Not on file  . Sexually Abused: Not on file   Family History  Problem Relation Age of Onset  . Hypertension Mother   . Cancer Mother   . Heart disease Father   . Hypertension Father     OBJECTIVE:  Vitals:   08/02/20 1804 08/02/20 1805 08/02/20 1849  BP:  109/72   Pulse:  92   Resp:  19   Temp:  (!) 100.6 F (38.1 C)   TempSrc:  Oral   SpO2:  (!) 89% 91%  Weight: 136 lb 11 oz (62 kg)    Height: 5\' 5"  (1.651 m)       General appearance: alert; appears fatigued, but nontoxic; speaking in full sentences and tolerating own secretions HEENT: NCAT; Ears: EACs clear, TMs pearly gray; Eyes: PERRL.  EOM grossly intact. Sinuses: nontender; Nose: nares patent without rhinorrhea, Throat: oropharynx clear, tonsils non erythematous or enlarged, uvula midline  Neck: supple without LAD Lungs: unlabored respirations, symmetrical air entry; cough: moderate; no respiratory distress; CTAB Heart: regular rate and rhythm.  Radial pulses 2+ symmetrical bilaterally Skin: warm and dry Psychological: alert and cooperative; normal mood and affect  LABS:  No results found for this or any previous visit (from the past 24 hour(s)).   ASSESSMENT & PLAN:  1. Viral URI with cough   2. Encounter for screening for COVID-19     Meds ordered this encounter  Medications  . acetaminophen (TYLENOL) tablet 650 mg  . benzonatate (TESSALON) 100 MG capsule    Sig: Take 1 capsule (100 mg total) by mouth every 8 (eight) hours.    Dispense:  30  capsule    Refill:  0  . predniSONE (DELTASONE) 10 MG tablet    Sig: Take 2 tablets (20 mg total) by mouth daily.    Dispense:  15 tablet    Refill:  0    Discharge Instructions    COVID testing ordered.  It will take between 4-7 days for test results.  Someone will contact you regarding abnormal results.    In the meantime: You should remain isolated in your home for 10 days from symptom onset AND greater than 24 hours after  symptoms resolution (absence of fever without the use of fever-reducing medication and improvement in respiratory symptoms), whichever is longer Get plenty of rest and push fluids Tessalon Perles prescribed for cough Low dose prednisone prescribed Use medications daily for symptom relief Use OTC medications like ibuprofen or tylenol as needed fever or pain Call or go to the ED if you have any new or worsening symptoms such as fever, worsening cough, shortness of breath, chest tightness, chest pain, turning blue, changes in mental status, etc...   Reviewed expectations re: course of current medical issues. Questions answered. Outlined signs and symptoms indicating need for more acute intervention. Patient verbalized understanding. After Visit Summary given.      Note: This document was prepared using Dragon voice recognition software and may include unintentional dictation errors.    Durward Parcel, FNP 08/02/20 [id number]

## 2020-08-02 NOTE — Discharge Instructions (Signed)
COVID testing ordered.  It will take between 4-7 days for test results.  Someone will contact you regarding abnormal results.    In the meantime: You should remain isolated in your home for 10 days from symptom onset AND greater than 24 hours after symptoms resolution (absence of fever without the use of fever-reducing medication and improvement in respiratory symptoms), whichever is longer Get plenty of rest and push fluids Tessalon Perles prescribed for cough Low dose prednisone prescribed Use medications daily for symptom relief Use OTC medications like ibuprofen or tylenol as needed fever or pain Call or go to the ED if you have any new or worsening symptoms such as fever, worsening cough, shortness of breath, chest tightness, chest pain, turning blue, changes in mental status, etc..Marland Kitchen

## 2020-08-02 NOTE — ED Triage Notes (Signed)
Cough since last Thursday and diarrhea x 3 days. Has been around a covid positive person.

## 2020-08-04 LAB — NOVEL CORONAVIRUS, NAA: SARS-CoV-2, NAA: DETECTED — AB

## 2020-08-04 LAB — SARS-COV-2, NAA 2 DAY TAT

## 2020-08-05 ENCOUNTER — Telehealth: Payer: Self-pay | Admitting: Adult Health

## 2020-08-05 NOTE — Telephone Encounter (Signed)
Called and LMOM regarding monoclonal antibody treatment for COVID 19 given to those who are at risk for complications and/or hospitalization of the virus.  Patient meets criteria based on: HTN  Call back number given: [phone]  My chart message: sent  Joelie Schou, NP  

## 2020-08-10 ENCOUNTER — Other Ambulatory Visit (INDEPENDENT_AMBULATORY_CARE_PROVIDER_SITE_OTHER): Payer: Self-pay | Admitting: Internal Medicine

## 2020-09-11 ENCOUNTER — Other Ambulatory Visit: Payer: Self-pay

## 2020-09-11 ENCOUNTER — Encounter (INDEPENDENT_AMBULATORY_CARE_PROVIDER_SITE_OTHER): Payer: Self-pay | Admitting: Internal Medicine

## 2020-09-11 ENCOUNTER — Ambulatory Visit (INDEPENDENT_AMBULATORY_CARE_PROVIDER_SITE_OTHER): Payer: Medicare Other | Admitting: Internal Medicine

## 2020-09-11 VITALS — BP 120/64 | HR 75 | Temp 97.4°F | Resp 18 | Ht 64.0 in | Wt 132.0 lb

## 2020-09-11 DIAGNOSIS — E559 Vitamin D deficiency, unspecified: Secondary | ICD-10-CM | POA: Diagnosis not present

## 2020-09-11 DIAGNOSIS — E782 Mixed hyperlipidemia: Secondary | ICD-10-CM | POA: Diagnosis not present

## 2020-09-11 DIAGNOSIS — I10 Essential (primary) hypertension: Secondary | ICD-10-CM

## 2020-09-11 DIAGNOSIS — Z0000 Encounter for general adult medical examination without abnormal findings: Secondary | ICD-10-CM

## 2020-09-11 MED ORDER — MONTELUKAST SODIUM 10 MG PO TABS: ORAL_TABLET | ORAL | 0 refills | Status: DC

## 2020-09-11 NOTE — Progress Notes (Signed)
Metrics: Intervention Frequency ACO  Documented Smoking Status Yearly  Screened one or more times in 24 months  Cessation Counseling or  Active cessation medication Past 24 months  Past 24 months   Guideline developer: UpToDate (See UpToDate for funding source) Date Released: 2014       Wellness Office Visit  Subjective:  Patient ID: Sara Mcmahon, female    DOB: 25-Feb-1961  Age: 59 y.o. MRN: [id number]  CC: This lady comes in post COVID-19 infection that was towards the end of August.  She complains of postnasal drainage. HPI  She has a history of hypertension, vitamin D deficiency, hyperlipidemia. She is consistent and compliant with carvedilol. She does take vitamin D 3 supplementation. She is not taking any medications for hyperlipidemia at the present time. She had several questions regarding COVID-19 vaccination. Past Medical History:  Diagnosis Date  . Cardiomyopathy (HCC)   . CHF (congestive heart failure) (HCC)   . Fractures   . GERD (gastroesophageal reflux disease)   . Hepatitis   . HLD (hyperlipidemia) 10/19/2019  . Hypercholesteremia   . Insomnia   . Takotsubo cardiomyopathy 10/19/2019  . Vitiligo    Past Surgical History:  Procedure Laterality Date  . CARDIAC CATHETERIZATION    . CARDIAC CATHETERIZATION    . CESAREAN SECTION       Family History  Problem Relation Age of Onset  . Hypertension Mother   . Cancer Mother   . Heart disease Father   . 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   Social History   Tobacco Use  . Smoking status: Never Smoker  . Smokeless tobacco: Never Used  Substance Use Topics  . Alcohol use: Never    Alcohol/week: 0.0 standard drinks    Current Meds  Medication Sig  . ALPRAZolam (XANAX) 0.5 MG tablet TAKE 1 TABLET(0.5 MG) BY MOUTH AT BEDTIME AS NEEDED FOR  ANXIETY  . benzonatate (TESSALON) 100 MG capsule Take 1 capsule (100 mg total) by mouth every 8 (eight) hours.  . carvedilol (COREG) 3.125 MG tablet TAKE 1 TABLET(3.125 MG) BY MOUTH TWICE DAILY WITH A MEAL (Patient taking differently: Take 3.125 mg by mouth daily. Takes 1 tablet at night time)  . cholecalciferol (VITAMIN D) 1000 units tablet Take 2,000 Units by mouth daily.  . fluticasone (FLONASE) 50 MCG/ACT nasal spray Place 1 spray into both nostrils daily.  Marland Kitchen MAGNESIUM ASPARTATE PO Take 1 tablet by mouth daily.  . montelukast (SINGULAIR) 10 MG tablet TAKE 1 TABLET(10 MG) BY MOUTH AT BEDTIME  . Multiple Vitamins-Minerals (MULTIVITAMIN WITH MINERALS) tablet Take 1 tablet by mouth daily.  . nitroGLYCERIN (NITROSTAT) 0.4 MG SL tablet Place 1 tablet (0.4 mg total) under the tongue every 5 (five) minutes as needed for chest pain.  . potassium chloride (MICRO-K) 10 MEQ CR capsule TAKE 1 CAPSULE(10 MEQ) BY MOUTH DAILY  . predniSONE (DELTASONE) 10 MG tablet Take 2 tablets (20 mg total) by mouth daily.  . [DISCONTINUED] montelukast (SINGULAIR) 10 MG tablet TAKE 1 TABLET(10 MG) BY MOUTH AT BEDTIME      Depression screen [facility] 2/9 01/24/2020 04/24/2019  Decreased Interest 0 0  Down, Depressed, Hopeless 0 1  PHQ - 2 Score 0 1     Objective:   Today's Vitals: BP 120/64 (BP Location: Left Arm, Patient Position: Sitting, Cuff Size: Normal)  Pulse 75   Temp (!) 97.4 F (36.3 C) (Temporal)   Resp 18   Ht 5\' 4"  (1.626 m)   Wt 132 lb (59.9 kg)   SpO2 98%   BMI 22.66 kg/m  Vitals with BMI 09/11/2020 08/02/2020 05/29/2020  Height 5\' 4"  5\' 5"  5\' 5"   Weight 132 lbs 136 lbs 11 oz 138 lbs  BMI 22.65 22.75 22.96  Systolic 120 109 05/31/2020  Diastolic 64 72 80  Pulse 75 92 86     Physical Exam   She looks systemically well.  She has lost 4 more pounds in the last 6 weeks.  Blood pressure is excellent.    Assessment   1. Essential hypertension, benign   2. Vitamin D deficiency disease   3. Mixed  hyperlipidemia   4. Encounter for Medicare annual wellness exam       Tests ordered No orders of the defined types were placed in this encounter.    Plan: 1. She will continue with carvedilol as before for hypertension. 2. She will continue with vitamin D3 supplementation. 3. She will continue to focus on nutrition to help her weight loss program as well as reduce her hyperlipidemia. 4. We did discuss COVID-19 vaccination and I have highly recommended and encouraged her to get this especially since she did have Covid already.  I answered all her questions regarding this. 5. Follow-up in about 3 months for an annual physical exam.   Meds ordered this encounter  Medications  . montelukast (SINGULAIR) 10 MG tablet    Sig: TAKE 1 TABLET(10 MG) BY MOUTH AT BEDTIME    Dispense:  90 tablet    Refill:  0    Adianna Darwin , MD

## 2020-09-15 ENCOUNTER — Other Ambulatory Visit (INDEPENDENT_AMBULATORY_CARE_PROVIDER_SITE_OTHER): Payer: Self-pay | Admitting: Internal Medicine

## 2020-11-18 ENCOUNTER — Other Ambulatory Visit (INDEPENDENT_AMBULATORY_CARE_PROVIDER_SITE_OTHER): Payer: Self-pay | Admitting: Internal Medicine

## 2020-12-09 ENCOUNTER — Other Ambulatory Visit (INDEPENDENT_AMBULATORY_CARE_PROVIDER_SITE_OTHER): Payer: Self-pay | Admitting: Internal Medicine

## 2020-12-26 ENCOUNTER — Encounter (INDEPENDENT_AMBULATORY_CARE_PROVIDER_SITE_OTHER): Payer: Medicare Other | Admitting: Internal Medicine

## 2021-01-12 ENCOUNTER — Emergency Department (HOSPITAL_COMMUNITY): Payer: Medicare Other

## 2021-01-12 ENCOUNTER — Inpatient Hospital Stay (HOSPITAL_COMMUNITY)
Admission: EM | Admit: 2021-01-12 | Discharge: 2021-01-14 | DRG: 280 | Disposition: A | Discharge status: Home / Self Care | Payer: Medicare Other | Attending: Student | Admitting: Student

## 2021-01-12 ENCOUNTER — Inpatient Hospital Stay: Admission: AD | Admit: 2021-01-12 | Payer: Medicare Other | Source: Other Acute Inpatient Hospital | Admitting: Student

## 2021-01-12 ENCOUNTER — Encounter (HOSPITAL_COMMUNITY): Payer: Self-pay | Admitting: *Deleted

## 2021-01-12 ENCOUNTER — Other Ambulatory Visit: Payer: Self-pay

## 2021-01-12 DIAGNOSIS — F5104 Psychophysiologic insomnia: Secondary | ICD-10-CM | POA: Diagnosis present

## 2021-01-12 DIAGNOSIS — I110 Hypertensive heart disease with heart failure: Secondary | ICD-10-CM | POA: Diagnosis present

## 2021-01-12 DIAGNOSIS — E7800 Pure hypercholesterolemia, unspecified: Secondary | ICD-10-CM | POA: Diagnosis present

## 2021-01-12 DIAGNOSIS — K219 Gastro-esophageal reflux disease without esophagitis: Secondary | ICD-10-CM | POA: Diagnosis not present

## 2021-01-12 DIAGNOSIS — Z91048 Other nonmedicinal substance allergy status: Secondary | ICD-10-CM | POA: Diagnosis not present

## 2021-01-12 DIAGNOSIS — E785 Hyperlipidemia, unspecified: Secondary | ICD-10-CM | POA: Diagnosis present

## 2021-01-12 DIAGNOSIS — I5181 Takotsubo syndrome: Secondary | ICD-10-CM | POA: Diagnosis present

## 2021-01-12 DIAGNOSIS — R079 Chest pain, unspecified: Secondary | ICD-10-CM | POA: Diagnosis not present

## 2021-01-12 DIAGNOSIS — Z8249 Family history of ischemic heart disease and other diseases of the circulatory system: Secondary | ICD-10-CM | POA: Diagnosis not present

## 2021-01-12 DIAGNOSIS — I214 Non-ST elevation (NSTEMI) myocardial infarction: Secondary | ICD-10-CM | POA: Diagnosis not present

## 2021-01-12 DIAGNOSIS — F411 Generalized anxiety disorder: Secondary | ICD-10-CM | POA: Diagnosis present

## 2021-01-12 DIAGNOSIS — Z79899 Other long term (current) drug therapy: Secondary | ICD-10-CM | POA: Diagnosis not present

## 2021-01-12 DIAGNOSIS — F1320 Sedative, hypnotic or anxiolytic dependence, uncomplicated: Secondary | ICD-10-CM | POA: Diagnosis present

## 2021-01-12 DIAGNOSIS — I10 Essential (primary) hypertension: Secondary | ICD-10-CM | POA: Diagnosis present

## 2021-01-12 DIAGNOSIS — I249 Acute ischemic heart disease, unspecified: Secondary | ICD-10-CM | POA: Diagnosis present

## 2021-01-12 DIAGNOSIS — R0789 Other chest pain: Secondary | ICD-10-CM | POA: Diagnosis not present

## 2021-01-12 DIAGNOSIS — Z20822 Contact with and (suspected) exposure to covid-19: Secondary | ICD-10-CM | POA: Diagnosis present

## 2021-01-12 DIAGNOSIS — R7989 Other specified abnormal findings of blood chemistry: Secondary | ICD-10-CM | POA: Diagnosis not present

## 2021-01-12 DIAGNOSIS — I209 Angina pectoris, unspecified: Secondary | ICD-10-CM | POA: Diagnosis not present

## 2021-01-12 DIAGNOSIS — I5021 Acute systolic (congestive) heart failure: Secondary | ICD-10-CM | POA: Diagnosis not present

## 2021-01-12 DIAGNOSIS — I25119 Atherosclerotic heart disease of native coronary artery with unspecified angina pectoris: Secondary | ICD-10-CM | POA: Diagnosis present

## 2021-01-12 DIAGNOSIS — I2510 Atherosclerotic heart disease of native coronary artery without angina pectoris: Secondary | ICD-10-CM | POA: Diagnosis not present

## 2021-01-12 LAB — BASIC METABOLIC PANEL
Anion gap: 8 (ref 5–15)
BUN: 16 mg/dL (ref 8–23)
CO2: 24 mmol/L (ref 22–32)
Calcium: 9.4 mg/dL (ref 8.9–10.3)
Chloride: 101 mmol/L (ref 98–111)
Creatinine, Ser: 0.82 mg/dL (ref 0.44–1.00)
GFR, Estimated: >60 mL/min (ref >60)
Glucose, Bld: 114 mg/dL — ABNORMAL HIGH (ref 70–99)
Potassium: 3.8 mmol/L (ref 3.5–5.1)
Sodium: 133 mmol/L — ABNORMAL LOW (ref 135–145)

## 2021-01-12 LAB — TROPONIN I (HIGH SENSITIVITY)
Troponin I (High Sensitivity): 45 ng/L — ABNORMAL HIGH (ref <18)
Troponin I (High Sensitivity): 466 ng/L (ref <18)
Troponin I (High Sensitivity): 1215 ng/L (ref <18)
Troponin I (High Sensitivity): 1389 ng/L (ref <18)
Troponin I (High Sensitivity): 1162 ng/L (ref <18)

## 2021-01-12 LAB — D-DIMER, QUANTITATIVE: D-Dimer, Quant: 0.51 ug/mL-FEU — ABNORMAL HIGH (ref 0.00–0.50)

## 2021-01-12 LAB — CBC
HCT: 40 % (ref 36.0–46.0)
Hemoglobin: 13.2 g/dL (ref 12.0–15.0)
MCH: 31 pg (ref 26.0–34.0)
MCHC: 33 g/dL (ref 30.0–36.0)
MCV: 93.9 fL (ref 80.0–100.0)
Platelets: 307 10*3/uL (ref 150–400)
RBC: 4.26 MIL/uL (ref 3.87–5.11)
RDW: 12.9 % (ref 11.5–15.5)
WBC: 10 10*3/uL (ref 4.0–10.5)
nRBC: 0 % (ref 0.0–0.2)

## 2021-01-12 LAB — HEPARIN LEVEL (UNFRACTIONATED): Heparin Unfractionated: 0.41 IU/mL (ref 0.30–0.70)

## 2021-01-12 LAB — SARS CORONAVIRUS 2 BY RT PCR (HOSPITAL ORDER, PERFORMED IN ~~LOC~~ HOSPITAL LAB): SARS Coronavirus 2: NEGATIVE

## 2021-01-12 LAB — BRAIN NATRIURETIC PEPTIDE: B Natriuretic Peptide: 194.2 pg/mL — ABNORMAL HIGH (ref 0.0–100.0)

## 2021-01-12 LAB — LDL CHOLESTEROL, DIRECT: Direct LDL: 162 mg/dL — ABNORMAL HIGH (ref 0–99)

## 2021-01-12 MED ORDER — ALPRAZOLAM 0.5 MG PO TABS
0.5000 mg | ORAL_TABLET | Freq: Every day | ORAL | Status: DC
Start: 2021-01-12 — End: 2021-01-14
  Administered 2021-01-12 – 2021-01-13 (×3): 0.5 mg via ORAL
  Filled 2021-01-12 (×3): qty 1

## 2021-01-12 MED ORDER — ASPIRIN 81 MG PO CHEW
324.0000 mg | CHEWABLE_TABLET | Freq: Once | ORAL | Status: AC
  Administered 2021-01-12: 324 mg via ORAL
  Filled 2021-01-12: qty 4

## 2021-01-12 MED ORDER — ACETAMINOPHEN 325 MG PO TABS
650.0000 mg | ORAL_TABLET | ORAL | Status: DC | PRN
  Administered 2021-01-13 (×2): 650 mg via ORAL
  Administered 2021-01-13: 325 mg via ORAL
  Administered 2021-01-13: 650 mg via ORAL
  Filled 2021-01-12 (×4): qty 2

## 2021-01-12 MED ORDER — ROSUVASTATIN CALCIUM 20 MG PO TABS
40.0000 mg | ORAL_TABLET | Freq: Every day | ORAL | Status: DC
  Administered 2021-01-12 – 2021-01-14 (×3): 40 mg via ORAL
  Filled 2021-01-12 (×3): qty 2

## 2021-01-12 MED ORDER — NITROGLYCERIN 0.4 MG SL SUBL
0.4000 mg | SUBLINGUAL_TABLET | SUBLINGUAL | Status: DC | PRN
  Administered 2021-01-12: 16:00:00 0.4 mg via SUBLINGUAL
  Filled 2021-01-12: qty 1

## 2021-01-12 MED ORDER — ASPIRIN EC 81 MG PO TBEC
81.0000 mg | DELAYED_RELEASE_TABLET | Freq: Every day | ORAL | Status: DC
  Administered 2021-01-13 – 2021-01-14 (×2): 81 mg via ORAL
  Filled 2021-01-12 (×2): qty 1

## 2021-01-12 MED ORDER — ONDANSETRON HCL 4 MG/2ML IJ SOLN: 4.0000 mg | Freq: Four times a day (QID) | INTRAMUSCULAR | Status: DC | PRN

## 2021-01-12 MED ORDER — MORPHINE SULFATE (PF) 2 MG/ML IV SOLN
4.0000 mg | Freq: Once | INTRAVENOUS | Status: AC
  Administered 2021-01-12: 4 mg via INTRAVENOUS
  Filled 2021-01-12: qty 2

## 2021-01-12 MED ORDER — HEPARIN (PORCINE) 25000 UT/250ML-% IV SOLN
800.0000 [IU]/h | INTRAVENOUS | Status: DC
  Administered 2021-01-12: 700 [IU]/h via INTRAVENOUS
  Filled 2021-01-12: qty 250

## 2021-01-12 MED ORDER — HEPARIN BOLUS VIA INFUSION
3500.0000 [IU] | Freq: Once | INTRAVENOUS | Status: AC
  Administered 2021-01-12: 3500 [IU] via INTRAVENOUS

## 2021-01-12 MED ORDER — CARVEDILOL 6.25 MG PO TABS
6.2500 mg | ORAL_TABLET | Freq: Two times a day (BID) | ORAL | Status: DC
  Administered 2021-01-13 (×2): 6.25 mg via ORAL
  Filled 2021-01-12 (×2): qty 1

## 2021-01-12 NOTE — H&P (Signed)
Cardiology Admission History and Physical:   Patient ID: Sara Mcmahon MRN: [id number]; DOB: 1961-07-14   Admission date: 01/12/2021  Primary Care Provider: Wilson Singer, MD Primary Cardiologist: Prentice Docker, MD (Inactive)  Primary Electrophysiologist:  None   Chief Complaint:  Chest pain  History of Present Illness:   Sara Mcmahon is a 60 y.o. 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  Transferred to [facility] for management of suspected NSTEMI.  The patient was in her usual state of health at church today.  The service today was particularly emotional as she describes multiple tearful interactions with youth members of sedation and overwhelming sensations.  Early afternoon as Sara Mcmahon walking out of church with her mother she noticed a vague chest discomfort.  She states that the discomfort was in the center of her chest and was a 4 out of 10.  She describes no associated symptoms or radiation, but the discomfort word or not she attempted to take a nitroglycerin, however, the left her bottle.  Patient ultimately presented to [facility], and by this point her discomfort was an 8 out of 10.  She states that when she was given a sublingual nitroglycerin x1 it helps some, but the pain still persisted.  She says she was then given intravenous morphine which made the pain go away.  In the past 6 months she is taking her nitroglycerin once when she felt some chest tightness during another emotional situation.  She says at no point has she changed her activities or experience chest pain with exertion.  She does think that her degree of activity tolerance is somewhat limited related to dyspnea none compared to a year ago.  She otherwise describes no palpitations, syncope,  presyncope, cough, fevers, heart failure symptomology.  Regarding the patient's cardiac history, patient carries a diagnosis of Takotsubo's cardiomyopathy.  This was diagnosed in 2016 after patient had similar presentation chest pain following an emotional interaction with her now son-in-law.  At outside facility she was noted to have an LVEF of 20 to 25%.  Subsequent left heart cath demonstrated normal coronary arteries.  The patient was put on carvedilol along with other medications that she cannot recall, and subsequently had repeat echocardiograms that showed normalization of her systolic function.  Heart Pathway Score:     Past Medical History:  Diagnosis Date  . Cardiomyopathy (HCC)   . CHF (congestive heart failure) (HCC)   . Fractures   . GERD (gastroesophageal reflux disease)   . Hepatitis   . HLD (hyperlipidemia) 10/19/2019  . Hypercholesteremia   . Insomnia   . Takotsubo cardiomyopathy 10/19/2019  . Vitiligo     Past Surgical History:  Procedure Laterality Date  . CARDIAC CATHETERIZATION    . CARDIAC CATHETERIZATION    . CESAREAN SECTION       Medications Prior to Admission: Prior to Admission medications   Medication Sig Start Date End Date Taking? Authorizing Provider  ALPRAZolam (XANAX) 0.5 MG tablet TAKE 1 TABLET(0.5 MG) BY MOUTH AT BEDTIME AS NEEDED FOR ANXIETY Patient taking differently: Take 0.5 mg by mouth at bedtime. TAKE 1 TABLET(0.5 MG) BY MOUTH AT BEDTIME AS NEEDED FOR ANXIETY 11/19/20  Yes Gosrani, Nimish C, MD  benzonatate (TESSALON) 100 MG capsule Take 1 capsule (100 mg total) by mouth every 8 (eight) hours. 08/02/20  Yes Avegno, Zachery Dakins,  FNP  carvedilol (COREG) 3.125 MG tablet TAKE 1 TABLET(3.125 MG) BY MOUTH TWICE DAILY WITH A MEAL Patient taking differently: Take 3.125 mg by mouth daily. Takes 1 tablet at night time 11/22/19  Yes Laqueta Linden, MD  cholecalciferol (VITAMIN D) 1000 units tablet Take 4,000 Units by mouth daily.   Yes [provider]  fluticasone (FLONASE) 50 MCG/ACT nasal spray Place 1 spray into both nostrils daily. 04/24/20  Yes Gosrani, Nimish C, MD  MAGNESIUM ASPARTATE PO Take 1 tablet by mouth daily.   Yes [provider]  montelukast (SINGULAIR) 10 MG tablet TAKE 1 TABLET(10 MG) BY MOUTH AT BEDTIME Patient taking differently: Take 10 mg by mouth every other day. 09/11/20  Yes Gosrani, Nimish C, MD  nitroGLYCERIN (NITROSTAT) 0.4 MG SL tablet Place 1 tablet (0.4 mg total) under the tongue every 5 (five) minutes as needed for chest pain. 10/19/19  Yes Gosrani, Nimish C, MD  potassium chloride (MICRO-K) 10 MEQ CR capsule TAKE 1 CAPSULE(10 MEQ) BY MOUTH DAILY Patient taking differently: Take 10 mEq by mouth daily. 12/09/20  Yes Gosrani, Nimish C, MD  Turmeric (QC TUMERIC COMPLEX PO) Take 1 tablet by mouth daily.   Yes [provider]     Allergies:    Allergies  Allergen Reactions  . Pollen Extract     Trees, grass, flowers    Social History:   Social History   Socioeconomic History  . Marital status: Widowed    Spouse name: Not on file  . Number of children: Not on file  . Years of education: Not on file  . Highest education level: Not on file  Occupational History  . Occupation: parttime    CommentMerchandiser, retail  Tobacco Use  . Smoking status: Never Smoker  . Smokeless tobacco: Never Used  Vaping Use  . Vaping Use: Never used  Substance and Sexual Activity  . Alcohol use: Never    Alcohol/week: 0.0 standard drinks  . Drug use: No  . Sexual activity: Not on file  Other Topics Concern  . 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   She tells me that she is on disability secondary to her cardiomyopathy.   Social Determinants of Health   Financial Resource Strain: Not on file  Food Insecurity: Not on file  Transportation Needs: Not on file  Physical Activity: Not on file  Stress: Not on file  Social Connections: Not on  file  Intimate Partner Violence: Not on file    Family History:   The patient's family history includes Cancer in her mother; Heart disease in her father; Hypertension in her father and mother.    ROS:  Please see the history of present illness.  All other ROS reviewed and negative.     Physical Exam/Data:   Vitals:   01/12/21 1930 01/12/21 2000 01/12/21 2030 01/12/21 2154  BP: 126/87 (!) 127/95 111/79 117/83  Pulse: 93 87 84 85  Resp: (!) 23 17 18    Temp:    98.4 F (36.9 C)  TempSrc:    Oral  SpO2: 98% 100% 97% 100%  Weight:    57 kg  Height:    5\' 4"  (1.626 m)    Intake/Output Summary (Last 24 hours) at 01/12/2021 2316 Last data filed at 01/12/2021 2001 Gross per 24 hour  Intake 47.83 ml  Output --  Net 47.83 ml   Last 3 Weights 01/12/2021 01/12/2021 09/11/2020  Weight (lbs)  125 lb 11.2 oz 130 lb 132 lb  Weight (kg) 57.017 kg 58.968 kg 59.875 kg     Body mass index is 21.58 kg/m.  General:  Well nourished, well developed, in no acute distress. HEENT: normal Lymph: no adenopathy Neck: no JVD Endocrine:  No thryomegaly Vascular: Radial and FA pulses 2+ Cardiac:  normal S1, S2; RRR; no murmur  Lungs:  clear to auscultation bilaterally, no wheezing, rhonchi or rales  Abd: soft, nontender, no hepatomegaly  Ext: no edema Musculoskeletal:  No deformities, BUE and BLE strength normal and equal Skin: warm and dry  Neuro:  CNs grossly intact, no focal abnormalities noted Psych:  Normal affect, mildly tangential, mildly anxious   EKG:  The ECG  was personally reviewed my me and demonstrates: Normal sinus rhythm and rate without evidence of AV nodal or intraventricular conduction disease.  No ST segment /t-wave abnormalities to suggest active ischemia.  Relevant CV Studies: 10/2019 TTE: 1. Left ventricular ejection fraction, by visual estimation, is 60 to  65%. The left ventricle has normal function. There is no left ventricular  hypertrophy.  2. Left ventricular  diastolic parameters are consistent with Grade I  diastolic dysfunction (impaired relaxation).  3. The left ventricle has no regional wall motion abnormalities.  4. Global right ventricle has normal systolic function.The right  ventricular size is normal. No increase in right ventricular wall  thickness.  5. Left atrial size was normal.  6. Right atrial size was normal.  7. The mitral valve is grossly normal. Trace mitral valve regurgitation.  8. The tricuspid valve is grossly normal. Tricuspid valve regurgitation  is trivial.  9. The aortic valve is tricuspid. Aortic valve regurgitation is not  visualized. No evidence of aortic valve sclerosis or stenosis.  10. The pulmonic valve was grossly normal. Pulmonic valve regurgitation is  trivial.  11. Normal pulmonary artery systolic pressure.  12. The inferior vena cava is normal in size with greater than 50%  respiratory variability, suggesting right atrial pressure of 3 mmHg.   08/2015 [facility]: SUMMARY:  --CARDIAC STRUCTURES:  --Analysis of regional contractile function demonstrated  anterolateral akinesis, mild apical hypokinesis, and diaphragmatic  akinesis. --Global left ventricular function was severely  depressed. EF calculated by contrast ventriculography was 20 %.  --The left ventricle was moderately dilated.  --CORONARY CIRCULATION:  --There was no angiographic evidence for coronary artery disease.    08/2015 TTE: Interpretation Summary  1. Normal LV size with severe LV dysfunction. Regional wall motion  characteristic of Takotsubo cardiomyopathy. Clinical correlation required  Left Ventricle  The left ventricle is normal in size. There is no thrombus. There is normal  left ventricular wall thickness. Left ventricular systolic function is  severely reduced. Ejection Fraction = 20-25%. The transmitral spectral  Doppler flow pattern is suggestive of impaired LV relaxation. Severe  hypokinesis to akinesis of the  mid and distal segments of the ventricle  with normal contractility of basal segments. Findings consistent with  Takotsubo cardiomyopathy. Clinical correlation required.    Laboratory Data:  High Sensitivity Troponin:   Recent Labs  Lab 01/12/21 1406 01/12/21 1600 01/12/21 1805 01/12/21 1949  TROPONINIHS 45* 466* 1,215* 1,389*      Chemistry Recent Labs  Lab 01/12/21 1406  NA 133*  K 3.8  CL 101  CO2 24  GLUCOSE 114*  BUN 16  CREATININE 0.82  CALCIUM 9.4  GFRNONAA >60  ANIONGAP 8    No results for input(s): PROT, ALBUMIN, AST, ALT, ALKPHOS, BILITOT in the  last 168 hours. Hematology Recent Labs  Lab 01/12/21 1406  WBC 10.0  RBC 4.26  HGB 13.2  HCT 40.0  MCV 93.9  MCH 31.0  MCHC 33.0  RDW 12.9  PLT 307   BNPNo results for input(s): BNP, PROBNP in the last 168 hours.  DDimer  Recent Labs  Lab 01/12/21 1600  DDIMER 0.51*   Radiology/Studies:  DG Chest Portable 1 View  Result Date: 01/12/2021 CLINICAL DATA:  Chest pain. EXAM: PORTABLE CHEST 1 VIEW COMPARISON:  August 02, 2020 FINDINGS: Cardiomediastinal silhouette is normal. Mediastinal contours appear intact. There is no evidence of focal airspace consolidation, pleural effusion or pneumothorax. Osseous structures are without acute abnormality. Soft tissues are grossly normal. IMPRESSION: No active disease. Electronically Signed   By: Ted Mcalpineobrinka  Dimitrova M.D.   On: 01/12/2021 15:09      TIMI Risk Score for Unstable Angina or Non-ST Elevation MI:   The patient's TIMI risk score is 1, which indicates a 5% risk of all cause mortality, new or recurrent myocardial infarction or need for urgent revascularization in the next 14 days.   Assessment and Plan:   Atypical angina elevated troponin concerning for NSTEMI: Patient's description of chest pain satisfies definition for atypical angina.  Patient has multiple coronary risk factors in the form of untreated hyperlipidemia and hypertension.  She displays typical  rise and fall pattern of troponin at time of presentation without other clear explanation.  At this time NSTEMI of unclear subtype appears to be most likely diagnosis.  Differential also includes recurrent Takotsubo's cardiomyopathy given the patient's history, and description of trigger.  She has no signs of decompensated heart failure at this time pulmonary edema or cardiomegaly on chest x-ray. -48 hours heparin ACS protocol per pharmacy. -Loaded with aspirin, 81 mg daily thereafter. -Patient's home once daily 3.125 Coreg has been changed to 6.25 twice daily dosing, will further uptitrate as tolerated. -Start Crestor 40 mg daily. - PRN SLNG -TTE in the morning. -N.p.o. at midnight. -Discussed with patient at length, that unless echocardiogram displays clear Takotsubo pattern, it is likely that we will pursue diagnostic left catheterization and possible PCI for treatment of NSTEMI.  The patient is in agreement with this approach. -Cardiac telemetry. -Trend troponin to peak. -BNP  Hyperlipidemia, active: Patient with LDL cholesterol greater than 160 on presentation.  She states previously she has been told she has high cholesterol, but an outside provider told her she did not be on cholesterol medicine anymore and she was taken off of prior therapies long ago. -Start Crestor 40 mg once daily.  Insomnia/generalized anxiety, chronic: Patient mildly anxious upon encounter.  She describes multiple prior emotional difficulties in her life.  She also describes chronic insomnia with dependence on benzodiazepines for sleep which she takes essentially every night. -Continue home Xanax.  Severity of Illness: The appropriate patient status for this patient is INPATIENT. Inpatient status is judged to be reasonable and necessary in order to provide the required intensity of service to ensure the patient's safety. The patient's presenting symptoms, physical exam findings, and initial radiographic and  laboratory data in the context of their chronic comorbidities is felt to place them at high risk for further clinical deterioration. Furthermore, it is not anticipated that the patient will be medically stable for discharge from the hospital within 2 midnights of admission. The following factors support the patient status of inpatient.   * I certify that at the point of admission it is my clinical judgment that the patient  will require inpatient hospital care spanning beyond 2 midnights from the point of admission due to high intensity of service, high risk for further deterioration and high frequency of surveillance required.*    For questions or updates, please contact [facility] Please consult [email or web address] for contact info under    Signed, Carleene Overlie, MD  01/12/2021 11:16 PM

## 2021-01-12 NOTE — ED Triage Notes (Signed)
Pt with mid cp during church service this am, pressure in nature.  Denies any N/V, + mild SOB, no distress noted in triage.  Pt states hx of same.

## 2021-01-12 NOTE — Progress Notes (Addendum)
ANTICOAGULATION CONSULT NOTE  Pharmacy Consult for Heparin Indication: chest pain/ACS  Allergies  Allergen Reactions   Pollen Extract     Trees, grass, flowers    Patient Measurements: Height: 5\' 4"  (162.6 cm) Weight: 57 kg (125 lb 11.2 oz) IBW/kg (Calculated) : 54.7 HEPARIN DW (KG): 57  Vital Signs: Temp: 98.4 F (36.9 C) (02/06 2154) Temp Source: Oral (02/06 2154) BP: 117/83 (02/06 2154) Pulse Rate: 85 (02/06 2154)  Labs: Recent Labs    01/12/21 1406 01/12/21 1600 01/12/21 1805 01/12/21 1949 01/12/21 2239  HGB 13.2  --   --   --   --   HCT 40.0  --   --   --   --   PLT 307  --   --   --   --   HEPARINUNFRC  --   --   --   --  0.41  CREATININE 0.82  --   --   --   --   TROPONINIHS 45* 466* 1,215* 1,389*  --     Estimated Creatinine Clearance: 61.4 mL/min (by C-G formula based on SCr of 0.82 mg/dL).   Assessment: 60 y.o. female with pertinent past medical history of cardiomyopathy, CHF, hepatitis, HLD, Takotsubo's that presents the emergency department today for chest pain. Patient also with SOB. Pharmacy asked to start heparin. Patient is not on oral anticoagulants.   Heparin level 0.41 (therapeutic) on gtt at 700 units/hr. No bleeding noted.  Goal of Therapy:  Heparin level 0.3-0.7 units/ml Monitor platelets by anticoagulation protocol: Yes   Plan:  Continue heparin at 700 units/hr Will f/u confirmatory heparin level in the morning  68, PharmD, BCPS Please see amion for complete clinical pharmacist phone list 01/12/2021,11:06 PM   Addendum (0300): Heparin level now down to 0.25 (subtherapeutic) on gtt at 700 units/hr. No issues with line or bleeding reported per RN.  Plan: Increase heparin gtt to 800 units/hr. Will f/u 6 hr heparin level  03/12/2021, PharmD, BCPS Please see amion for complete clinical pharmacist phone list 01/13/2021 2:53 AM

## 2021-01-12 NOTE — Progress Notes (Signed)
ANTICOAGULATION CONSULT NOTE - Initial Consult  Pharmacy Consult for Heparin Indication: chest pain/ACS  Allergies  Allergen Reactions  . Lasix [Furosemide] Other (See Comments)    Headache, dizziness & throat irritation  . Pollen Extract     Trees, grass, flowers    Patient Measurements: Height: 5\' 4"  (162.6 cm) Weight: 59 kg (130 lb) IBW/kg (Calculated) : 54.7 HEPARIN DW (KG): 59  Vital Signs: Temp: 97.6 F (36.4 C) (02/06 1354) Temp Source: Oral (02/06 1354) BP: 114/82 (02/06 1600) Pulse Rate: 85 (02/06 1600)  Labs: Recent Labs    01/12/21 1406 01/12/21 1600  HGB 13.2  --   HCT 40.0  --   PLT 307  --   CREATININE 0.82  --   TROPONINIHS 45* 466*    Estimated Creatinine Clearance: 61.4 mL/min (by C-G formula based on SCr of 0.82 mg/dL).   Medical History: Past Medical History:  Diagnosis Date  . Cardiomyopathy (HCC)   . CHF (congestive heart failure) (HCC)   . Fractures   . GERD (gastroesophageal reflux disease)   . Hepatitis   . HLD (hyperlipidemia) 10/19/2019  . Hypercholesteremia   . Insomnia   . Takotsubo cardiomyopathy 10/19/2019  . Vitiligo     Medications:  See med rec  Assessment: 60 y.o. female with pertinent past medical history of cardiomyopathy, CHF, hepatitis, HLD, Takotsubo's that presents the emergency department today for chest pain. Patient also with SOB. Pharmacy asked to start heparin. Patient is not on oral anticoagulants.   Goal of Therapy:  Heparin level 0.3-0.7 units/ml Monitor platelets by anticoagulation protocol: Yes   Plan:  Give 3500 units bolus x 1 Start heparin infusion at 700 units/hr Check anti-Xa level in ~6-8  hours and daily while on heparin Continue to monitor H&H and platelets  68, BS Elder Cyphers, BCPS Clinical Pharmacist Pager [phone] 01/12/2021,5:26 PM

## 2021-01-12 NOTE — Plan of Care (Addendum)
[place] MD contacted regarding transfer from [facility].  In brief, the patient is a 60yo with HLD, GERD, reported Takotsubo ( now with recovered EF), and anxiety ( chonic Benzodiazapine use) who presents with chest pain.  Per reported chest pain started while at rest in church and the patient was in a reported state of particular stress. CP not impacted by [facility] much per report. She is normotensive without significant lab or vital sign abnormalities.  Objective data Troponin  45-->466  EKg-> sinus, normal rate, no ST deviations to suggest ischemia.  CXR without pulmonary edema.  Echocardiogram 02/19/16: Normal left ventricular systolic function and regional wall motion, LVEF 55-60%, mild LVH, grade 1 diastolic dysfunction  She underwent coronary angiography in September 2016 which was normal.  Action: Atypical/Non-angina CP with typical rise in troponin concerning for NSTEMI, active: -Heparin 48hrs per ACS protocol. - Full dose aspirin load and then 81mg  daily after. - 40mg  Crestor and check LDL. - Continue home Coreg - Attempt SLNG if any impact is noted. - Trend troponin to peak. - TTE - Accepted to transfer wait list. If not transferred by tomorrow, should be seen by APP on sight in the AM - ED primary team to address patient's chronic conditions and any acute changes until patient is transferred.

## 2021-01-12 NOTE — ED Provider Notes (Signed)
[facility] EMERGENCY DEPARTMENT Provider Note   CSN: [id number] Arrival date & time: 01/12/21  1338     History Chief Complaint  Patient presents with  . Chest Pain    Sara Mcmahon is a 60 y.o. female with pertinent past medical history of cardiomyopathy, CHF, hepatitis, HLD, Takotsubo's that presents the emergency department today for chest pain.  Patient states that she started having chest pressure during church service this morning.  Patient states that chest pain is in the center of her chest, worse with inspiration.  Patient also has some shortness of breath associated with this.  Patient states the chest pain started when she was having a very emotional moment at church, states that some of his emotional she has been at church because her mother just received bad news about her cancer diagnosis and she was praying for her at that time.  Patient states that right prior to that she was having a very excited moment where she was overjoyed.  Patient states that this feels exactly like when she had Takotsubo's cardiomyopathy, was treated for this in [place] related patient states at that time she also had a very emotional moment when she started having chest pain.  Per chart review in 2016, patient did have echo which showed LV at 20%, did have cardiac cath at that time which did not show any evidence of CAD, was diagnosed with  Takotsubo's.  Recent echo a year ago does show 60 to 65% ejection fraction.  Patient also states that at that time she had a cardiac cath which was clear.  Did have a cardiologist  has not seen him over over a year.  Patient states that she has not had chest pain like this since her cardiomyopathy.  Denies any nausea, vomiting, fevers, chills, abdominal pain, back pain.  Chest pain does not radiate anywhere. No reguritation symptoms, pt has acid reflux and does not feel as if this is acid reflux.  HPI     Past Medical History:  Diagnosis Date  . Cardiomyopathy  (HCC)   . CHF (congestive heart failure) (HCC)   . Fractures   . GERD (gastroesophageal reflux disease)   . Hepatitis   . HLD (hyperlipidemia) 10/19/2019  . Hypercholesteremia   . Insomnia   . Takotsubo cardiomyopathy 10/19/2019  . Vitiligo     Patient Active Problem List   Diagnosis Date Noted  . NSTEMI (non-ST elevated myocardial infarction) (HCC) 01/12/2021  . Essential hypertension, benign 10/19/2019  . Takotsubo cardiomyopathy 10/19/2019  . HLD (hyperlipidemia) 10/19/2019    Past Surgical History:  Procedure Laterality Date  . CARDIAC CATHETERIZATION    . CARDIAC CATHETERIZATION    . CESAREAN SECTION       OB History    Gravida  2   Para  2   Term  2   Preterm      AB      Living        SAB      IAB      Ectopic      Multiple      Live Births              Family History  Problem Relation Age of Onset  . Hypertension Mother   . Cancer Mother   . Heart disease Father   . Hypertension Father     Social History   Tobacco Use  . Smoking status: Never Smoker  . Smokeless tobacco: Never Used  Vaping  Use  . Vaping Use: Never used  Substance Use Topics  . Alcohol use: Never    Alcohol/week: 0.0 standard drinks  . Drug use: No    Home Medications Prior to Admission medications   Medication Sig Start Date End Date Taking? Authorizing Provider  ALPRAZolam (XANAX) 0.5 MG tablet TAKE 1 TABLET(0.5 MG) BY MOUTH AT BEDTIME AS NEEDED FOR ANXIETY Patient taking differently: Take 0.5 mg by mouth at bedtime. TAKE 1 TABLET(0.5 MG) BY MOUTH AT BEDTIME AS NEEDED FOR ANXIETY 11/19/20  Yes Gosrani, Nimish C, MD  benzonatate (TESSALON) 100 MG capsule Take 1 capsule (100 mg total) by mouth every 8 (eight) hours. 08/02/20  Yes Avegno, Zachery Dakins, FNP  carvedilol (COREG) 3.125 MG tablet TAKE 1 TABLET(3.125 MG) BY MOUTH TWICE DAILY WITH A MEAL Patient taking differently: Take 3.125 mg by mouth daily. Takes 1 tablet at night time 11/22/19  Yes Laqueta Linden, MD  cholecalciferol (VITAMIN D) 1000 units tablet Take 4,000 Units by mouth daily.   Yes [provider]  fluticasone (FLONASE) 50 MCG/ACT nasal spray Place 1 spray into both nostrils daily. 04/24/20  Yes Gosrani, Nimish C, MD  montelukast (SINGULAIR) 10 MG tablet TAKE 1 TABLET(10 MG) BY MOUTH AT BEDTIME Patient taking differently: Take 10 mg by mouth every other day. 09/11/20  Yes Gosrani, Nimish C, MD  potassium chloride (MICRO-K) 10 MEQ CR capsule TAKE 1 CAPSULE(10 MEQ) BY MOUTH DAILY Patient taking differently: Take 10 mEq by mouth daily. 12/09/20  Yes Gosrani, Nimish C, MD  Turmeric (QC TUMERIC COMPLEX PO) Take 1 tablet by mouth daily.   Yes [provider]  MAGNESIUM ASPARTATE PO Take 1 tablet by mouth daily. Patient not taking: No sig reported    [provider]  nitroGLYCERIN (NITROSTAT) 0.4 MG SL tablet Place 1 tablet (0.4 mg total) under the tongue every 5 (five) minutes as needed for chest pain. 10/19/19   Wilson Singer, MD  predniSONE (DELTASONE) 10 MG tablet Take 2 tablets (20 mg total) by mouth daily. Patient not taking: No sig reported 08/02/20   Durward Parcel, FNP    Allergies    Lasix [furosemide] and Pollen extract  Review of Systems   Review of Systems  Constitutional: Negative for chills, diaphoresis, fatigue and fever.  HENT: Negative for congestion, sore throat and trouble swallowing.   Eyes: Negative for pain and visual disturbance.  Respiratory: Negative for cough, shortness of breath and wheezing.   Cardiovascular: Positive for chest pain. Negative for palpitations and leg swelling.  Gastrointestinal: Negative for abdominal distention, abdominal pain, diarrhea, nausea and vomiting.  Genitourinary: Negative for difficulty urinating.  Musculoskeletal: Negative for back pain, neck pain and neck stiffness.  Skin: Negative for pallor.  Neurological: Negative for dizziness, speech difficulty, weakness and headaches.   Psychiatric/Behavioral: Negative for confusion.    Physical Exam Updated Vital Signs BP (!) 106/92   Pulse 93   Temp 97.6 F (36.4 C) (Oral)   Resp 20   Ht 5\' 4"  (1.626 m)   Wt 59 kg   SpO2 99%   BMI 22.31 kg/m   Physical Exam Constitutional:      General: She is not in acute distress.    Appearance: Normal appearance. She is not ill-appearing, toxic-appearing or diaphoretic.  HENT:     Mouth/Throat:     Mouth: Mucous membranes are moist.     Pharynx: Oropharynx is clear.  Eyes:     General: No scleral icterus.  Extraocular Movements: Extraocular movements intact.     Pupils: Pupils are equal, round, and reactive to light.  Cardiovascular:     Rate and Rhythm: Normal rate and regular rhythm.     Pulses: Normal pulses.     Heart sounds: Normal heart sounds.  Pulmonary:     Effort: Pulmonary effort is normal. No respiratory distress.     Breath sounds: Normal breath sounds. No stridor. No wheezing, rhonchi or rales.  Chest:     Chest wall: No tenderness.  Abdominal:     General: Abdomen is flat. There is no distension.     Palpations: Abdomen is soft.     Tenderness: There is no abdominal tenderness. There is no guarding or rebound.  Musculoskeletal:        General: No swelling or tenderness. Normal range of motion.     Cervical back: Normal range of motion and neck supple. No rigidity.     Right lower leg: No edema.     Left lower leg: No edema.  Skin:    General: Skin is warm and dry.     Capillary Refill: Capillary refill takes less than 2 seconds.     Coloration: Skin is not pale.  Neurological:     General: No focal deficit present.     Mental Status: She is alert and oriented to person, place, and time.  Psychiatric:        Mood and Affect: Mood normal.        Behavior: Behavior normal.     ED Results / Procedures / Treatments   Labs (all labs ordered are listed, but only abnormal results are displayed) Labs Reviewed  BASIC METABOLIC PANEL -  Abnormal; Notable for the following components:      Result Value   Sodium 133 (*)    Glucose, Bld 114 (*)    All other components within normal limits  D-DIMER, QUANTITATIVE (NOT AT [facility]) - Abnormal; Notable for the following components:   D-Dimer, Quant 0.51 (*)    All other components within normal limits  TROPONIN I (HIGH SENSITIVITY) - Abnormal; Notable for the following components:   Troponin I (High Sensitivity) 45 (*)    All other components within normal limits  TROPONIN I (HIGH SENSITIVITY) - Abnormal; Notable for the following components:   Troponin I (High Sensitivity) 466 (*)    All other components within normal limits  TROPONIN I (HIGH SENSITIVITY) - Abnormal; Notable for the following components:   Troponin I (High Sensitivity) 1,215 (*)    All other components within normal limits  SARS CORONAVIRUS 2 BY RT PCR (HOSPITAL ORDER, PERFORMED IN [place] [facility])  CBC  LDL CHOLESTEROL, DIRECT  HEPARIN LEVEL (UNFRACTIONATED)  CBC  TROPONIN I (HIGH SENSITIVITY)    EKG EKG Interpretation  Date/Time:  Sunday January 12 2021 13:43:56 EST Ventricular Rate:  76 PR Interval:  180 QRS Duration: 78 QT Interval:  380 QTC Calculation: 427 R Axis:   85 Text Interpretation: Normal sinus rhythm with sinus arrhythmia Normal ECG Confirmed by Vanetta Mulders [phone]) on 01/12/2021 3:06:07 PM   Radiology DG Chest Portable 1 View  Result Date: 01/12/2021 CLINICAL DATA:  Chest pain. EXAM: PORTABLE CHEST 1 VIEW COMPARISON:  August 02, 2020 FINDINGS: Cardiomediastinal silhouette is normal. Mediastinal contours appear intact. There is no evidence of focal airspace consolidation, pleural effusion or pneumothorax. Osseous structures are without acute abnormality. Soft tissues are grossly normal. IMPRESSION: No active disease. Electronically Signed   By: Sharlyne Pacas  Dimitrova M.D.   On: 01/12/2021 15:09    Procedures .Critical Care Performed by: Farrel Gordon, PA-C Authorized by:  Farrel Gordon, PA-C   Critical care provider statement:    Critical care time (minutes):  45   Critical care was time spent personally by me on the following activities:  Discussions with consultants, evaluation of patient's response to treatment, examination of patient, ordering and performing treatments and interventions, ordering and review of laboratory studies, ordering and review of radiographic studies, pulse oximetry, re-evaluation of patient's condition, obtaining history from patient or surrogate and review of old charts     Medications Ordered in ED Medications  nitroGLYCERIN (NITROSTAT) SL tablet 0.4 mg (0.4 mg Sublingual Given 01/12/21 1549)  rosuvastatin (CRESTOR) tablet 40 mg (40 mg Oral Given 01/12/21 1803)  heparin bolus via infusion 3,500 Units (3,500 Units Intravenous Bolus from Bag 01/12/21 1747)    Followed by  heparin ADULT infusion 100 units/mL (25000 units/282mL) (700 Units/hr Intravenous New Bag/Given 01/12/21 1748)  aspirin chewable tablet 324 mg (324 mg Oral Given 01/12/21 1549)  morphine 2 MG/ML injection 4 mg (4 mg Intravenous Given 01/12/21 1747)    ED Course  I have reviewed the triage vital signs and the nursing notes.  Pertinent labs & imaging results that were available during my care of the patient were reviewed by me and considered in my medical decision making (see chart for details).    MDM Rules/Calculators/A&P                          Sara Mcmahon is a 60 y.o. female with pertinent past medical history of cardiomyopathy, CHF, hepatitis, HLD, Takotsubo's that presents the emergency department today for chest pain.  Concern for ACS.  Patient is hemodynamically stable. Initial interventions asprin and nitro.   ECG interpreted by me demonstrated NSR.   CXR interpreted by me demonstrated no acute cardiopulmonary disease.   Labs demonstrated unremarkable CBC and CMP. Troponin 45.  Second troponin 466.  No changes in EKG.  Most likely NSTEMI.  Patient  states that she still having pain after nitro, will give more nitro and initiate morphine.  Heparin started.  D-dimer 0.51, normal once age-adjusted.  Did speak to cardiologist, Dr. Amie Critchley for NSTEMI, he will admit over at [facility].  Upon reevaluation, repeat troponin 1215.  Patient states that she does feel much better after second round of nitro with morphine.  States that she is currently pain-free.  Dr. Amie Critchley plan for cath most likely tomorrow morning.  Patient continues to be hemodynamically stable.  The patient appears reasonably stabilized for admission considering the current resources, flow, and capabilities available in the ED at this time, and I doubt any other [facility] requiring further screening and/or treatment in the ED prior to admission.  I discussed this case with my attending physician who cosigned this note including patient's presenting symptoms, physical exam, and planned diagnostics and interventions. Attending physician stated agreement with plan or made changes to plan which were implemented.   Attending physician assessed patient at bedside.  Final Clinical Impression(s) / ED Diagnoses Final diagnoses:  NSTEMI (non-ST elevated myocardial infarction) [facility])    Rx / DC Orders ED Discharge Orders    None       Farrel Gordon, PA-C 01/12/21 1947    Vanetta Mulders, MD 01/16/21 [phone]

## 2021-01-13 ENCOUNTER — Inpatient Hospital Stay (HOSPITAL_COMMUNITY): Payer: Medicare Other

## 2021-01-13 ENCOUNTER — Encounter (HOSPITAL_COMMUNITY)
Admission: EM | Disposition: A | Discharge status: Home / Self Care | Payer: Self-pay | Source: Home / Self Care | Attending: Student

## 2021-01-13 DIAGNOSIS — I214 Non-ST elevation (NSTEMI) myocardial infarction: Secondary | ICD-10-CM

## 2021-01-13 DIAGNOSIS — I2510 Atherosclerotic heart disease of native coronary artery without angina pectoris: Secondary | ICD-10-CM | POA: Diagnosis not present

## 2021-01-13 DIAGNOSIS — I5181 Takotsubo syndrome: Secondary | ICD-10-CM | POA: Diagnosis not present

## 2021-01-13 HISTORY — PX: LEFT HEART CATH AND CORONARY ANGIOGRAPHY: CATH118249

## 2021-01-13 LAB — CBC
HCT: 35 % — ABNORMAL LOW (ref 36.0–46.0)
Hemoglobin: 11.9 g/dL — ABNORMAL LOW (ref 12.0–15.0)
MCH: 31.6 pg (ref 26.0–34.0)
MCHC: 34 g/dL (ref 30.0–36.0)
MCV: 93.1 fL (ref 80.0–100.0)
Platelets: 238 10*3/uL (ref 150–400)
RBC: 3.76 MIL/uL — ABNORMAL LOW (ref 3.87–5.11)
RDW: 12.9 % (ref 11.5–15.5)
WBC: 9.6 10*3/uL (ref 4.0–10.5)
nRBC: 0 % (ref 0.0–0.2)

## 2021-01-13 LAB — ECHOCARDIOGRAM COMPLETE
Area-P 1/2: 3.91 cm2
Height: 64 in
S' Lateral: 3.5 cm
Single Plane A2C EF: 34.7 %
Weight: 2019.2 [oz_av]

## 2021-01-13 LAB — GLUCOSE, CAPILLARY: Glucose-Capillary: 119 mg/dL — ABNORMAL HIGH (ref 70–99)

## 2021-01-13 LAB — HEMOGLOBIN A1C
Hgb A1c MFr Bld: 5.5 % (ref 4.8–5.6)
Mean Plasma Glucose: 111.15 mg/dL

## 2021-01-13 LAB — HEPARIN LEVEL (UNFRACTIONATED)
Heparin Unfractionated: 0.25 IU/mL — ABNORMAL LOW (ref 0.30–0.70)
Heparin Unfractionated: 0.3 [IU]/mL (ref 0.30–0.70)

## 2021-01-13 SURGERY — LEFT HEART CATH AND CORONARY ANGIOGRAPHY
Anesthesia: LOCAL

## 2021-01-13 MED ORDER — SODIUM CHLORIDE 0.9 % IV SOLN: 250.0000 mL | INTRAVENOUS | Status: DC | PRN

## 2021-01-13 MED ORDER — NITROGLYCERIN 1 MG/10 ML FOR IR/CATH LAB
INTRA_ARTERIAL | Status: AC
  Filled 2021-01-13: qty 10

## 2021-01-13 MED ORDER — SODIUM CHLORIDE 0.9% FLUSH
3.0000 mL | Freq: Two times a day (BID) | INTRAVENOUS | Status: DC
  Administered 2021-01-13: 3 mL via INTRAVENOUS

## 2021-01-13 MED ORDER — LABETALOL HCL 5 MG/ML IV SOLN
10.0000 mg | INTRAVENOUS | Status: AC | PRN
Start: 2021-01-13 — End: 2021-01-13

## 2021-01-13 MED ORDER — MIDAZOLAM HCL 2 MG/2ML IJ SOLN
INTRAMUSCULAR | Status: AC
  Filled 2021-01-13: qty 2

## 2021-01-13 MED ORDER — HEPARIN SODIUM (PORCINE) 1000 UNIT/ML IJ SOLN
INTRAMUSCULAR | Status: DC | PRN
  Administered 2021-01-13: 3000 [IU] via INTRAVENOUS

## 2021-01-13 MED ORDER — SODIUM CHLORIDE 0.9 % IV SOLN: INTRAVENOUS | Status: DC

## 2021-01-13 MED ORDER — VERAPAMIL HCL 2.5 MG/ML IV SOLN
INTRAVENOUS | Status: DC | PRN
  Administered 2021-01-13: 10 mL via INTRA_ARTERIAL

## 2021-01-13 MED ORDER — ENOXAPARIN SODIUM 40 MG/0.4ML ~~LOC~~ SOLN: 40.0000 mg | SUBCUTANEOUS | Status: DC

## 2021-01-13 MED ORDER — SODIUM CHLORIDE 0.9% FLUSH: 3.0000 mL | INTRAVENOUS | Status: DC | PRN

## 2021-01-13 MED ORDER — HEPARIN (PORCINE) IN NACL 1000-0.9 UT/500ML-% IV SOLN
INTRAVENOUS | Status: DC | PRN
  Administered 2021-01-13 (×2): 500 mL

## 2021-01-13 MED ORDER — ASPIRIN 81 MG PO CHEW
81.0000 mg | CHEWABLE_TABLET | ORAL | Status: DC
Start: 2021-01-14 — End: 2021-01-13

## 2021-01-13 MED ORDER — NITROGLYCERIN 1 MG/10 ML FOR IR/CATH LAB
INTRA_ARTERIAL | Status: DC | PRN
  Administered 2021-01-13: 200 ug via INTRACORONARY
  Administered 2021-01-13: 100 ug via INTRACORONARY

## 2021-01-13 MED ORDER — HYDRALAZINE HCL 20 MG/ML IJ SOLN: 10.0000 mg | INTRAMUSCULAR | Status: AC | PRN

## 2021-01-13 MED ORDER — MIDAZOLAM HCL 2 MG/2ML IJ SOLN
INTRAMUSCULAR | Status: DC | PRN
  Administered 2021-01-13: 1 mg via INTRAVENOUS

## 2021-01-13 MED ORDER — HEPARIN (PORCINE) IN NACL 1000-0.9 UT/500ML-% IV SOLN
INTRAVENOUS | Status: AC
  Filled 2021-01-13: qty 1000

## 2021-01-13 MED ORDER — FENTANYL CITRATE (PF) 100 MCG/2ML IJ SOLN
INTRAMUSCULAR | Status: DC | PRN
  Administered 2021-01-13: 25 ug via INTRAVENOUS

## 2021-01-13 MED ORDER — FENTANYL CITRATE (PF) 100 MCG/2ML IJ SOLN
INTRAMUSCULAR | Status: AC
  Filled 2021-01-13: qty 2

## 2021-01-13 MED ORDER — LIDOCAINE HCL (PF) 1 % IJ SOLN
INTRAMUSCULAR | Status: DC | PRN
  Administered 2021-01-13: 2 mL

## 2021-01-13 MED ORDER — HEPARIN SODIUM (PORCINE) 1000 UNIT/ML IJ SOLN
INTRAMUSCULAR | Status: AC
  Filled 2021-01-13: qty 1

## 2021-01-13 MED ORDER — IOHEXOL 350 MG/ML SOLN
INTRAVENOUS | Status: DC | PRN
  Administered 2021-01-13: 30 mL

## 2021-01-13 MED ORDER — VERAPAMIL HCL 2.5 MG/ML IV SOLN
INTRAVENOUS | Status: AC
  Filled 2021-01-13: qty 2

## 2021-01-13 MED ORDER — LIDOCAINE HCL (PF) 1 % IJ SOLN
INTRAMUSCULAR | Status: AC
  Filled 2021-01-13: qty 30

## 2021-01-13 SURGICAL SUPPLY — 11 items
CATH INFINITI JR4 5F (CATHETERS) ×2 IMPLANT
CATH OPTITORQUE TIG 4.0 5F (CATHETERS) ×2 IMPLANT
DEVICE RAD COMP TR BAND LRG (VASCULAR PRODUCTS) ×2 IMPLANT
GLIDESHEATH SLEND SS 6F .021 (SHEATH) ×2 IMPLANT
GUIDEWIRE INQWIRE 1.5J.035X260 (WIRE) ×1 IMPLANT
INQWIRE 1.5J .035X260CM (WIRE) ×2
KIT HEART LEFT (KITS) ×2 IMPLANT
PACK CARDIAC CATHETERIZATION (CUSTOM PROCEDURE TRAY) ×2 IMPLANT
SYR MEDRAD MARK 7 150ML (SYRINGE) ×2 IMPLANT
TRANSDUCER W/STOPCOCK (MISCELLANEOUS) ×2 IMPLANT
TUBING CIL FLEX 10 FLL-RA (TUBING) ×2 IMPLANT

## 2021-01-13 NOTE — Plan of Care (Signed)
  Problem: Education: Goal: Knowledge of General Education information will improve Description Including pain rating scale, medication(s)/side effects and non-pharmacologic comfort measures Outcome: Progressing   Problem: Cardiac: Goal: Ability to achieve and maintain adequate cardiovascular perfusion will improve Outcome: Progressing   

## 2021-01-13 NOTE — Progress Notes (Signed)
  Echocardiogram 2D Echocardiogram has been performed.  Delcie Roch 01/13/2021, 9:47 AM

## 2021-01-13 NOTE — Interval H&P Note (Signed)
History and Physical Interval Note:  01/13/2021 3:51 PM  Sara Mcmahon  has presented today for surgery, with the diagnosis of NSTEMI.  The various methods of treatment have been discussed with the patient and family. After consideration of risks, benefits and other options for treatment, the patient has consented to  Procedure(s): LEFT HEART CATH AND CORONARY ANGIOGRAPHY (N/A) as a surgical intervention.  The patient's history has been reviewed, patient examined, no change in status, stable for surgery.  I have reviewed the patient's chart and labs.  Questions were answered to the patient's satisfaction.  Cath Lab Visit (complete for each Cath Lab visit)  Clinical Evaluation Leading to the Procedure:   ACS: Yes.    Non-ACS:  N/A  Dedric Ethington

## 2021-01-13 NOTE — Progress Notes (Addendum)
Progress Note  Patient Name: Sara Mcmahon Date of Encounter: 01/13/2021  [facility] Cardiologist: Prentice Docker, MD (Inactive)   Subjective   1/10 chest discomfort or even none. Denies dyspnea or palpitations.   Inpatient Medications    Scheduled Meds: . ALPRAZolam  0.5 mg Oral QHS  . aspirin EC  81 mg Oral Daily  . carvedilol  6.25 mg Oral BID WC  . rosuvastatin  40 mg Oral Daily   Continuous Infusions: . heparin 800 Units/hr (01/13/21 0301)   PRN Meds: acetaminophen, nitroGLYCERIN, ondansetron (ZOFRAN) IV   Vital Signs    Vitals:   01/12/21 2154 01/13/21 0025 01/13/21 0445 01/13/21 0714  BP: 117/83  91/63 91/68  Pulse: 85  77 84  Resp: 18 18 18 18   Temp: 98.4 F (36.9 C) 98.4 F (36.9 C) 98 F (36.7 C)   TempSrc: Oral Oral Oral   SpO2: 100%  100% 100%  Weight: 57 kg  57.2 kg   Height: 5\' 4"  (1.626 m)       Intake/Output Summary (Last 24 hours) at 01/13/2021 0810 Last data filed at 01/12/2021 2230 Gross per 24 hour  Intake 287.83 ml  Output 200 ml  Net 87.83 ml   Last 3 Weights 01/13/2021 01/12/2021 01/12/2021  Weight (lbs) 126 lb 3.2 oz 125 lb 11.2 oz 130 lb  Weight (kg) 57.244 kg 57.017 kg 58.968 kg      Telemetry    SR - Personally Reviewed  ECG    SR with non specific TW changes in V2 & V3- Personally Reviewed  Physical Exam   GEN: No acute distress.   Neck: No JVD Cardiac: RRR, no murmurs, rubs, or gallops.  Respiratory: Clear to auscultation bilaterally. GI: Soft, nontender, non-distended  MS: No edema; No deformity. Neuro:  Nonfocal  Psych: Normal affect   Labs    High Sensitivity Troponin:   Recent Labs  Lab 01/12/21 1406 01/12/21 1600 01/12/21 1805 01/12/21 1949 01/12/21 2239  TROPONINIHS 45* 466* 1,215* 1,389* 1,162*      Chemistry Recent Labs  Lab 01/12/21 1406  NA 133*  K 3.8  CL 101  CO2 24  GLUCOSE 114*  BUN 16  CREATININE 0.82  CALCIUM 9.4  GFRNONAA >60  ANIONGAP 8     Hematology Recent Labs   Lab 01/12/21 1406  WBC 10.0  RBC 4.26  HGB 13.2  HCT 40.0  MCV 93.9  MCH 31.0  MCHC 33.0  RDW 12.9  PLT 307    BNP Recent Labs  Lab 01/12/21 2239  BNP 194.2*     DDimer  Recent Labs  Lab 01/12/21 1600  DDIMER 0.51*     Radiology    DG Chest Portable 1 View  Result Date: 01/12/2021 CLINICAL DATA:  Chest pain. EXAM: PORTABLE CHEST 1 VIEW COMPARISON:  August 02, 2020 FINDINGS: Cardiomediastinal silhouette is normal. Mediastinal contours appear intact. There is no evidence of focal airspace consolidation, pleural effusion or pneumothorax. Osseous structures are without acute abnormality. Soft tissues are grossly normal. IMPRESSION: No active disease. Electronically Signed   By: 03/12/2021 M.D.   On: 01/12/2021 15:09    Cardiac Studies   Pending echo   10/2019 TTE: 1. Left ventricular ejection fraction, by visual estimation, is 60 to  65%. The left ventricle has normal function. There is no left ventricular  hypertrophy.  2. Left ventricular diastolic parameters are consistent with Grade I  diastolic dysfunction (impaired relaxation).  3. The left ventricle has no regional  wall motion abnormalities.  4. Global right ventricle has normal systolic function.The right  ventricular size is normal. No increase in right ventricular wall  thickness.  5. Left atrial size was normal.  6. Right atrial size was normal.  7. The mitral valve is grossly normal. Trace mitral valve regurgitation.  8. The tricuspid valve is grossly normal. Tricuspid valve regurgitation  is trivial.  9. The aortic valve is tricuspid. Aortic valve regurgitation is not  visualized. No evidence of aortic valve sclerosis or stenosis.  10. The pulmonic valve was grossly normal. Pulmonic valve regurgitation is  trivial.  11. Normal pulmonary artery systolic pressure.  12. The inferior vena cava is normal in size with greater than 50%  respiratory variability, suggesting right atrial  pressure of 3 mmHg.   08/2015 [facility]: SUMMARY:  --CARDIAC STRUCTURES:  --Analysis of regional contractile function demonstrated  anterolateral akinesis, mild apical hypokinesis, and diaphragmatic  akinesis. --Global left ventricular function was severely  depressed. EF calculated by contrast ventriculography was 20 %.  --The left ventricle was moderately dilated.  --CORONARY CIRCULATION:  --There was no angiographic evidence for coronary artery disease.   08/2015 TTE: Interpretation Summary  1. Normal LV size with severe LV dysfunction. Regional wall motion  characteristic of Takotsubo cardiomyopathy. Clinical correlation required  Left Ventricle  The left ventricle is normal in size. There is no thrombus. There is normal  left ventricular wall thickness. Left ventricular systolic function is  severely reduced. Ejection Fraction = 20-25%. The transmitral spectral  Doppler flow pattern is suggestive of impaired LV relaxation. Severe  hypokinesis to akinesis of the mid and distal segments of the ventricle  with normal contractility of basal segments. Findings consistent with  Takotsubo cardiomyopathy. Clinical correlation required.      Patient Profile     60 y.o. 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  Transferred to [facility] for management of suspected NSTEMI.  Emotional at church as she describes multiple tearful interactions with youth members of sedation and overwhelming sensations. Recently under lots of stress after being moved out by landlord and 62 year old mother with finding of recurrent lung cancer.   Assessment & Plan    1. NSTEMI - Troponin peaked at 1389 then trended down. EKG with non specific TWI, similar to prior. Treated with  heparin gtt. Chest discomfort almost resolved.  - Her current presentation similar to her prior hx of Takotsubo's cardiomyopathy.  - Plan to get echo this morning and further recommendations based on this.  - Continue ASA - Continue Coreg 6.25mg  BId (increased from 3.125mg  daily) - Continue statin - Keep NPO. Will get echo soon.  - Recommended heparin for 48 hours  2. HLD - 05/29/2020: Cholesterol 225; HDL 47; LDL Cholesterol (Calc) 147; Triglycerides 171 - Crestor started this admission    For questions or updates, please contact [facility] Please consult [email or web address] for contact info under        Signed, Manson Passey, PA  01/13/2021, 8:10 AM    Personally seen and examined. Agree with above.   60 year old with non-ST elevation myocardial infarction troponin peaking at 1300 currently trending downward.  Currently breathing fairly comfortably in bed.  She states that this episode was similar to her episode in 2016 but was more severe, more pressure in her chest.  She had her heart catheterization in  2016 in [place].  GEN: Well nourished, well developed, in no acute distress  HEENT: normal  Neck: no JVD, carotid bruits, or masses Cardiac: RRR; no murmurs, rubs, or gallops,no edema  Respiratory:  clear to auscultation bilaterally, normal work of breathing GI: soft, nontender, nondistended, + BS MS: no deformity or atrophy  Skin: warm and dry, no rash Neuro:  Alert and Oriented x 3, Strength and sensation are intact Psych: euthymic mood, full affect   Non-ST elevation myocardial infarction -Similar to prior Takotsubo.  Prior heart catheterization in 2016 unremarkable. -Echocardiogram just done, preliminarily LV function severely reduced 25% range.  There is some basilar sparing which is seen with Takotsubo cardiomyopathy.  Historically, this seems very similar to 2016.  Left heart catheterization at that time showed normal coronary arteries.-At that time she was  placed on carvedilol and other medications she could not recall.  She did demonstrate normalization of her pump function shortly thereafter. -I still think it is in her best interest to proceed with left heart catheterization to ensure that there is no change in her coronary anatomy since it has been about 7 years.  Risks and benefits of been explained including stroke heart attack death renal impairment bleeding and she is willing to proceed.  Acute systolic heart failure -Blood pressure is fairly soft, likely secondary to ejection fraction of 25% and reduced cardiac output.  We will utilize goal-directed medical therapy as she tolerates.  Donato Schultz, MD

## 2021-01-13 NOTE — H&P (View-Only) (Signed)
Progress Note  Patient Name: Sara Mcmahon Date of Encounter: 01/13/2021  [facility] Cardiologist: Prentice Docker, MD (Inactive)   Subjective   1/10 chest discomfort or even none. Denies dyspnea or palpitations.   Inpatient Medications    Scheduled Meds: . ALPRAZolam  0.5 mg Oral QHS  . aspirin EC  81 mg Oral Daily  . carvedilol  6.25 mg Oral BID WC  . rosuvastatin  40 mg Oral Daily   Continuous Infusions: . heparin 800 Units/hr (01/13/21 0301)   PRN Meds: acetaminophen, nitroGLYCERIN, ondansetron (ZOFRAN) IV   Vital Signs    Vitals:   01/12/21 2154 01/13/21 0025 01/13/21 0445 01/13/21 0714  BP: 117/83  91/63 91/68  Pulse: 85  77 84  Resp: 18 18 18 18   Temp: 98.4 F (36.9 C) 98.4 F (36.9 C) 98 F (36.7 C)   TempSrc: Oral Oral Oral   SpO2: 100%  100% 100%  Weight: 57 kg  57.2 kg   Height: 5\' 4"  (1.626 m)       Intake/Output Summary (Last 24 hours) at 01/13/2021 0810 Last data filed at 01/12/2021 2230 Gross per 24 hour  Intake 287.83 ml  Output 200 ml  Net 87.83 ml   Last 3 Weights 01/13/2021 01/12/2021 01/12/2021  Weight (lbs) 126 lb 3.2 oz 125 lb 11.2 oz 130 lb  Weight (kg) 57.244 kg 57.017 kg 58.968 kg      Telemetry    SR - Personally Reviewed  ECG    SR with non specific TW changes in V2 & V3- Personally Reviewed  Physical Exam   GEN: No acute distress.   Neck: No JVD Cardiac: RRR, no murmurs, rubs, or gallops.  Respiratory: Clear to auscultation bilaterally. GI: Soft, nontender, non-distended  MS: No edema; No deformity. Neuro:  Nonfocal  Psych: Normal affect   Labs    High Sensitivity Troponin:   Recent Labs  Lab 01/12/21 1406 01/12/21 1600 01/12/21 1805 01/12/21 1949 01/12/21 2239  TROPONINIHS 45* 466* 1,215* 1,389* 1,162*      Chemistry Recent Labs  Lab 01/12/21 1406  NA 133*  K 3.8  CL 101  CO2 24  GLUCOSE 114*  BUN 16  CREATININE 0.82  CALCIUM 9.4  GFRNONAA >60  ANIONGAP 8     Hematology Recent Labs   Lab 01/12/21 1406  WBC 10.0  RBC 4.26  HGB 13.2  HCT 40.0  MCV 93.9  MCH 31.0  MCHC 33.0  RDW 12.9  PLT 307    BNP Recent Labs  Lab 01/12/21 2239  BNP 194.2*     DDimer  Recent Labs  Lab 01/12/21 1600  DDIMER 0.51*     Radiology    DG Chest Portable 1 View  Result Date: 01/12/2021 CLINICAL DATA:  Chest pain. EXAM: PORTABLE CHEST 1 VIEW COMPARISON:  August 02, 2020 FINDINGS: Cardiomediastinal silhouette is normal. Mediastinal contours appear intact. There is no evidence of focal airspace consolidation, pleural effusion or pneumothorax. Osseous structures are without acute abnormality. Soft tissues are grossly normal. IMPRESSION: No active disease. Electronically Signed   By: 03/12/2021 M.D.   On: 01/12/2021 15:09    Cardiac Studies   Pending echo   10/2019 TTE: 1. Left ventricular ejection fraction, by visual estimation, is 60 to  65%. The left ventricle has normal function. There is no left ventricular  hypertrophy.  2. Left ventricular diastolic parameters are consistent with Grade I  diastolic dysfunction (impaired relaxation).  3. The left ventricle has no regional  wall motion abnormalities.  4. Global right ventricle has normal systolic function.The right  ventricular size is normal. No increase in right ventricular wall  thickness.  5. Left atrial size was normal.  6. Right atrial size was normal.  7. The mitral valve is grossly normal. Trace mitral valve regurgitation.  8. The tricuspid valve is grossly normal. Tricuspid valve regurgitation  is trivial.  9. The aortic valve is tricuspid. Aortic valve regurgitation is not  visualized. No evidence of aortic valve sclerosis or stenosis.  10. The pulmonic valve was grossly normal. Pulmonic valve regurgitation is  trivial.  11. Normal pulmonary artery systolic pressure.  12. The inferior vena cava is normal in size with greater than 50%  respiratory variability, suggesting right atrial  pressure of 3 mmHg.   08/2015 [facility]: SUMMARY:  --CARDIAC STRUCTURES:  --Analysis of regional contractile function demonstrated  anterolateral akinesis, mild apical hypokinesis, and diaphragmatic  akinesis. --Global left ventricular function was severely  depressed. EF calculated by contrast ventriculography was 20 %.  --The left ventricle was moderately dilated.  --CORONARY CIRCULATION:  --There was no angiographic evidence for coronary artery disease.   08/2015 TTE: Interpretation Summary  1. Normal LV size with severe LV dysfunction. Regional wall motion  characteristic of Takotsubo cardiomyopathy. Clinical correlation required  Left Ventricle  The left ventricle is normal in size. There is no thrombus. There is normal  left ventricular wall thickness. Left ventricular systolic function is  severely reduced. Ejection Fraction = 20-25%. The transmitral spectral  Doppler flow pattern is suggestive of impaired LV relaxation. Severe  hypokinesis to akinesis of the mid and distal segments of the ventricle  with normal contractility of basal segments. Findings consistent with  Takotsubo cardiomyopathy. Clinical correlation required.      Patient Profile     60 y.o. 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  Transferred to [facility] for management of suspected NSTEMI.  Emotional at church as she describes multiple tearful interactions with youth members of sedation and overwhelming sensations. Recently under lots of stress after being moved out by landlord and 62 year old mother with finding of recurrent lung cancer.   Assessment & Plan    1. NSTEMI - Troponin peaked at 1389 then trended down. EKG with non specific TWI, similar to prior. Treated with  heparin gtt. Chest discomfort almost resolved.  - Her current presentation similar to her prior hx of Takotsubo's cardiomyopathy.  - Plan to get echo this morning and further recommendations based on this.  - Continue ASA - Continue Coreg 6.25mg  BId (increased from 3.125mg  daily) - Continue statin - Keep NPO. Will get echo soon.  - Recommended heparin for 48 hours  2. HLD - 05/29/2020: Cholesterol 225; HDL 47; LDL Cholesterol (Calc) 147; Triglycerides 171 - Crestor started this admission    For questions or updates, please contact [facility] Please consult [email or web address] for contact info under        Signed, Manson Passey, PA  01/13/2021, 8:10 AM    Personally seen and examined. Agree with above.   60 year old with non-ST elevation myocardial infarction troponin peaking at 1300 currently trending downward.  Currently breathing fairly comfortably in bed.  She states that this episode was similar to her episode in 2016 but was more severe, more pressure in her chest.  She had her heart catheterization in  2016 in [place].  GEN: Well nourished, well developed, in no acute distress  HEENT: normal  Neck: no JVD, carotid bruits, or masses Cardiac: RRR; no murmurs, rubs, or gallops,no edema  Respiratory:  clear to auscultation bilaterally, normal work of breathing GI: soft, nontender, nondistended, + BS MS: no deformity or atrophy  Skin: warm and dry, no rash Neuro:  Alert and Oriented x 3, Strength and sensation are intact Psych: euthymic mood, full affect   Non-ST elevation myocardial infarction -Similar to prior Takotsubo.  Prior heart catheterization in 2016 unremarkable. -Echocardiogram just done, preliminarily LV function severely reduced 25% range.  There is some basilar sparing which is seen with Takotsubo cardiomyopathy.  Historically, this seems very similar to 2016.  Left heart catheterization at that time showed normal coronary arteries.-At that time she was  placed on carvedilol and other medications she could not recall.  She did demonstrate normalization of her pump function shortly thereafter. -I still think it is in her best interest to proceed with left heart catheterization to ensure that there is no change in her coronary anatomy since it has been about 7 years.  Risks and benefits of been explained including stroke heart attack death renal impairment bleeding and she is willing to proceed.  Acute systolic heart failure -Blood pressure is fairly soft, likely secondary to ejection fraction of 25% and reduced cardiac output.  We will utilize goal-directed medical therapy as she tolerates.  Mark Skains, MD  

## 2021-01-13 NOTE — Progress Notes (Signed)
ANTICOAGULATION CONSULT NOTE  Pharmacy Consult for Heparin Indication: chest pain/ACS  Allergies  Allergen Reactions  . Pollen Extract     Trees, grass, flowers    Patient Measurements: Height: 5\' 4"  (162.6 cm) Weight: 57.2 kg (126 lb 3.2 oz) IBW/kg (Calculated) : 54.7 HEPARIN DW (KG): 57  Vital Signs: Temp: 98 F (36.7 C) (02/07 0445) Temp Source: Oral (02/07 0445) BP: 91/68 (02/07 0714) Pulse Rate: 84 (02/07 0714)  Labs: Recent Labs    01/12/21 1406 01/12/21 1600 01/12/21 1805 01/12/21 1949 01/12/21 2239 01/13/21 0142 01/13/21 0907  HGB 13.2  --   --   --   --   --   --   HCT 40.0  --   --   --   --   --   --   PLT 307  --   --   --   --   --   --   HEPARINUNFRC  --   --   --   --  0.41 0.25* 0.30  CREATININE 0.82  --   --   --   --   --   --   TROPONINIHS 45*   < > 1,215* 1,389* 1,162*  --   --    < > = values in this interval not displayed.    Estimated Creatinine Clearance: 61.4 mL/min (by C-G formula based on SCr of 0.82 mg/dL).   Assessment: 60 y.o. female with pertinent past medical history of cardiomyopathy, CHF, hepatitis, HLD, Takotsubo's that presents the emergency department today for chest pain. Patient also with SOB. Pharmacy asked to start heparin. Patient is not on oral anticoagulants.   Heparin level 0.3 at goal this am on heparin drip rate 80 uts/hr No bleeding noted. CBC stable  Goal of Therapy:  Heparin level 0.3-0.7 units/ml Monitor platelets by anticoagulation protocol: Yes   Plan:  Continue Heparin drip 800 uts/hr Daily CBC, HL Monitor s/s bleeding    66 Pharm.D. CPP, BCPS Clinical Pharmacist [phone] 01/13/2021 10:16 AM

## 2021-01-14 ENCOUNTER — Encounter (HOSPITAL_COMMUNITY): Payer: Self-pay | Admitting: Internal Medicine

## 2021-01-14 ENCOUNTER — Telehealth: Payer: Self-pay | Admitting: Family Medicine

## 2021-01-14 DIAGNOSIS — I5181 Takotsubo syndrome: Secondary | ICD-10-CM | POA: Diagnosis not present

## 2021-01-14 LAB — BASIC METABOLIC PANEL
Anion gap: 8 (ref 5–15)
BUN: 15 mg/dL (ref 8–23)
CO2: 25 mmol/L (ref 22–32)
Calcium: 8.9 mg/dL (ref 8.9–10.3)
Chloride: 106 mmol/L (ref 98–111)
Creatinine, Ser: 0.9 mg/dL (ref 0.44–1.00)
GFR, Estimated: >60 mL/min (ref >60)
Glucose, Bld: 101 mg/dL — ABNORMAL HIGH (ref 70–99)
Potassium: 3.6 mmol/L (ref 3.5–5.1)
Sodium: 139 mmol/L (ref 135–145)

## 2021-01-14 LAB — CBC
HCT: 33.6 % — ABNORMAL LOW (ref 36.0–46.0)
Hemoglobin: 11.1 g/dL — ABNORMAL LOW (ref 12.0–15.0)
MCH: 30.7 pg (ref 26.0–34.0)
MCHC: 33 g/dL (ref 30.0–36.0)
MCV: 92.8 fL (ref 80.0–100.0)
Platelets: 225 10*3/uL (ref 150–400)
RBC: 3.62 MIL/uL — ABNORMAL LOW (ref 3.87–5.11)
RDW: 13.2 % (ref 11.5–15.5)
WBC: 7.3 10*3/uL (ref 4.0–10.5)
nRBC: 0 % (ref 0.0–0.2)

## 2021-01-14 MED ORDER — ASPIRIN 81 MG PO TBEC: 81.0000 mg | DELAYED_RELEASE_TABLET | Freq: Every day | ORAL | 11 refills | Status: AC

## 2021-01-14 MED ORDER — ROSUVASTATIN CALCIUM 20 MG PO TABS: 20.0000 mg | ORAL_TABLET | Freq: Every day | ORAL | 6 refills | Status: DC

## 2021-01-14 MED ORDER — CARVEDILOL 3.125 MG PO TABS
3.1250 mg | ORAL_TABLET | Freq: Two times a day (BID) | ORAL | Status: DC
  Administered 2021-01-14: 3.125 mg via ORAL
  Filled 2021-01-14: qty 1

## 2021-01-14 MED ORDER — CARVEDILOL 3.125 MG PO TABS: 3.1250 mg | ORAL_TABLET | Freq: Two times a day (BID) | ORAL | 2 refills | Status: DC

## 2021-01-14 NOTE — Discharge Summary (Addendum)
Discharge Summary    Patient ID: Sara Mcmahon MRN: [id number]; DOB: 1961/08/31  Admit date: 01/12/2021 Discharge date: 01/14/2021  Primary Care Provider: Wilson Singer, MD  Primary Cardiologist: Dina Rich, MD (previous patient of Dr. Purvis Sheffield)  Discharge Diagnoses    Principal Problem:   NSTEMI (non-ST elevated myocardial infarction) [facility]) Active Problems:   Essential hypertension, benign   Takotsubo cardiomyopathy   HLD (hyperlipidemia)   ACS (acute coronary syndrome) [facility])   Diagnostic Studies/Procedures    LEFT HEART CATH AND CORONARY ANGIOGRAPHY  01/13/21    Conclusion  Conclusions: 1. Mild, non-obstructive coronary artery disease with 30% proximal LAD disease as well as catheter-induced vasospasm involving the proximal RCA that improved from 95% to 10-20% following administration of intracoronary nitroglycerin. 2. Severely reduced left-ventricular contraction with wall motion abnormality consistent with Takotsubo variant. 3. Normal left ventricular filling pressure.  Recommendations: 1. Optimize goal directed medical therapy for acute systolic heart failure due to stress-induced cardiomyopathy. 2. Medical therapy and risk factor modification to prevent progression of atherosclerotic coronary artery disease.   Echo  01/13/21 1. Left ventricular ejection fraction, by estimation, is 25 to 30%. The  left ventricle has severely decreased function. The left ventricle  demonstrates regional wall motion abnormalities with severe hypokinesis to  akinesis of the mid and apical LV  segments, relative preservation of the basal segments and the apical cap.  Need to rule out coronary disease, but stress (Takotsubo-type)  cardiomyopathy is certainly possible. Left ventricular diastolic  parameters are consistent with Grade I diastolic  dysfunction (impaired relaxation).  2. Right ventricular systolic function is normal. The right ventricular  size is normal. There is  normal pulmonary artery systolic pressure. The  estimated right ventricular systolic pressure is 20.0 mmHg.  3. The mitral valve is normal in structure. Trivial mitral valve  regurgitation. No evidence of mitral stenosis.  4. The aortic valve is tricuspid. Aortic valve regurgitation is not  visualized. No aortic stenosis is present.  5. The inferior vena cava is normal in size with greater than 50%  respiratory variability, suggesting right atrial pressure of 3 mmHg.   History of Present Illness     Sara Mcmahon is a 60 y.o. female with hypertension,HLD (not on treatment), heart failure with recoveredejection fraction secondary to Takotsubo's cardiomyopathy, insomnia/anxiety (benzodiazepine dependence), vitiligopresents to [facility] emergency department with chest pain. Transferred to [facility] for management of suspected NSTEMI.  Patient carries a diagnosis of Takotsubo's cardiomyopathy.  This was diagnosed in 2016 after patient had similar presentation of chest pain following an emotional interaction with her now son-in-law.  At outside facility she was noted to have an LVEF of 20 to 25%.  Subsequent left heart cath demonstrated normal coronary arteries.  The patient was put on carvedilol along with other medications that she cannot recall, and subsequently had repeat echocardiograms that showed normalization of her systolic function.  She was emotional at church as she describes multiple tearful interactions with youth members of sedation and overwhelming sensations. Initially she started to having vague chest discomfort with ambulation but worsen and came to [facility]ER. Given SL x 1 with some relief.   Recently under lots of stress after being moved out by landlord and 53 year old mother with finding of recurrent lung cancer.  Hospital Course     Consultants: None  1. NSTEMI/ Vasospasm / Takotsubo's cardiomyopathy.  - Troponin peaked at 1389 then trended down. EKG with non  specific TWI, similar to prior. Treated  with heparin gtt. Symptoms improved and resolved.  - Her current presentation similar to her prior hx of Takotsubo's cardiomyopathy.  -Echocardiogram showed LV function of 25 to 30%, grade 1 diastolic dysfunction and Takotsubo type wall motion abnormality. -Cardiac catheterization showed mild nonobstructive CAD and catheter induced coronary vasospasm to proximal RCA  that improved from 95% to 10-20% following administration of intracoronary nitroglycerin.  Had wall motion abnormality consistent with Takotsubo cardiomyopathy. -Continue aspirin 81 mg and Crestor -She was taking Coreg 3.125 mg daily at home which increased to 6.25 mg twice daily.  However given hypotension will reduce to 3.125 mg twice daily.  Advised to keep log of blood pressure. -Discussed low-sodium diet.  Patient is euvolemic.   2. HLD - 05/29/2020: Cholesterol 225; HDL 47; LDL Cholesterol (Calc) 147; Triglycerides 171 - Crestor started this admission    Did the patient have an acute coronary syndrome (MI, NSTEMI, STEMI, etc) this admission?:  No.   The elevated Troponin was due to the acute medical illness (demand ischemia).      _____________  Discharge Vitals Blood pressure 103/60, pulse 73, temperature 98 F (36.7 C), temperature source Oral, resp. rate 16, height 5\' 4"  (1.626 m), weight 57.2 kg, SpO2 98 %.  Filed Weights   01/12/21 2154 01/13/21 0445 01/14/21 0423  Weight: 57 kg 57.2 kg 57.2 kg   Physical Exam Constitutional:      Appearance: She is well-developed.  Cardiovascular:     Rate and Rhythm: Normal rate and regular rhythm.     Heart sounds: Normal heart sounds.  Pulmonary:     Effort: Pulmonary effort is normal.     Breath sounds: Normal breath sounds.  Musculoskeletal:        General: Normal range of motion.     Cervical back: Normal range of motion and neck supple.  Skin:    General: Skin is warm and dry.  Neurological:     General: No focal  deficit present.     Mental Status: She is alert and oriented to person, place, and time.  Psychiatric:        Mood and Affect: Mood normal.        Behavior: Behavior normal.   cath site without hematoma  Labs & Radiologic Studies    CBC Recent Labs    01/13/21 0142 01/14/21 0217  WBC 9.6 7.3  HGB 11.9* 11.1*  HCT 35.0* 33.6*  MCV 93.1 92.8  PLT 238 225   Basic Metabolic Panel Recent Labs    43/56/86 1406 01/14/21 0217  NA 133* 139  K 3.8 3.6  CL 101 106  CO2 24 25  GLUCOSE 114* 101*  BUN 16 15  CREATININE 0.82 0.90  CALCIUM 9.4 8.9   High Sensitivity Troponin:   Recent Labs  Lab 01/12/21 1406 01/12/21 1600 01/12/21 1805 01/12/21 1949 01/12/21 2239  TROPONINIHS 45* 466* 1,215* 1,389* 1,162*    D-Dimer Recent Labs    01/12/21 1600  DDIMER 0.51*   Hemoglobin A1C Recent Labs    01/13/21 0142  HGBA1C 5.5   Fasting Lipid Panel Recent Labs    01/12/21 1600  LDLDIRECT 162.0*   _____________  CARDIAC CATHETERIZATION  Result Date: 01/13/2021 Conclusions: 1. Mild, non-obstructive coronary artery disease with 30% proximal LAD disease as well as catheter-induced vasospasm involving the proximal RCA that improved from 95% to 10-20% following administration of intracoronary nitroglycerin. 2. Severely reduced left-ventricular contraction with wall motion abnormality consistent with Takotsubo variant. 3. Normal left ventricular filling pressure.  Recommendations: 1. Optimize goal directed medical therapy for acute systolic heart failure due to stress-induced cardiomyopathy. 2. Medical therapy and risk factor modification to prevent progression of atherosclerotic coronary artery disease. Yvonne Kendall, MD [facility]   DG Chest Portable 1 View  Result Date: 01/12/2021 CLINICAL DATA:  Chest pain. EXAM: PORTABLE CHEST 1 VIEW COMPARISON:  August 02, 2020 FINDINGS: Cardiomediastinal silhouette is normal. Mediastinal contours appear intact. There is no evidence of  focal airspace consolidation, pleural effusion or pneumothorax. Osseous structures are without acute abnormality. Soft tissues are grossly normal. IMPRESSION: No active disease. Electronically Signed   By: Ted Mcalpine M.D.   On: 01/12/2021 15:09   ECHOCARDIOGRAM COMPLETE  Result Date: 01/13/2021    ECHOCARDIOGRAM REPORT   Patient Name:   Sara Mcmahon Date of Exam: 01/13/2021 Medical Rec #:  [id number]       Height:       64.0 in Accession #:    [id number]      Weight:       126.2 lb Date of Birth:  08-Jun-1961        BSA:          1.609 m Patient Age:    60 years        BP:           91/68 mmHg Patient Gender: F               HR:           81 bpm. Exam Location:  Inpatient Procedure: 2D Echo Indications:    Takotsubo  History:        Patient has prior history of Echocardiogram examinations, most                 recent 10/26/2019. Risk Factors:Hypertension and Dyslipidemia.  Sonographer:    Delcie Roch Referring Phys: [id number] JACOB N BLACKWELL IMPRESSIONS  1. Left ventricular ejection fraction, by estimation, is 25 to 30%. The left ventricle has severely decreased function. The left ventricle demonstrates regional wall motion abnormalities with severe hypokinesis to akinesis of the mid and apical LV segments, relative preservation of the basal segments and the apical cap. Need to rule out coronary disease, but stress (Takotsubo-type) cardiomyopathy is certainly possible. Left ventricular diastolic parameters are consistent with Grade I diastolic dysfunction (impaired relaxation).  2. Right ventricular systolic function is normal. The right ventricular size is normal. There is normal pulmonary artery systolic pressure. The estimated right ventricular systolic pressure is 20.0 mmHg.  3. The mitral valve is normal in structure. Trivial mitral valve regurgitation. No evidence of mitral stenosis.  4. The aortic valve is tricuspid. Aortic valve regurgitation is not visualized. No aortic stenosis is present.   5. The inferior vena cava is normal in size with greater than 50% respiratory variability, suggesting right atrial pressure of 3 mmHg. FINDINGS  Left Ventricle: Left ventricular ejection fraction, by estimation, is 25 to 30%. The left ventricle has severely decreased function. The left ventricle demonstrates regional wall motion abnormalities. The left ventricular internal cavity size was normal  in size. There is no left ventricular hypertrophy. Left ventricular diastolic parameters are consistent with Grade I diastolic dysfunction (impaired relaxation). Right Ventricle: The right ventricular size is normal. No increase in right ventricular wall thickness. Right ventricular systolic function is normal. There is normal pulmonary artery systolic pressure. The tricuspid regurgitant velocity is 2.06 m/s, and  with an assumed right atrial pressure of 3 mmHg, the estimated right ventricular systolic  pressure is 20.0 mmHg. Left Atrium: Left atrial size was normal in size. Right Atrium: Right atrial size was normal in size. Pericardium: There is no evidence of pericardial effusion. Mitral Valve: The mitral valve is normal in structure. Trivial mitral valve regurgitation. No evidence of mitral valve stenosis. Tricuspid Valve: The tricuspid valve is normal in structure. Tricuspid valve regurgitation is trivial. Aortic Valve: The aortic valve is tricuspid. Aortic valve regurgitation is not visualized. No aortic stenosis is present. Pulmonic Valve: The pulmonic valve was normal in structure. Pulmonic valve regurgitation is not visualized. Aorta: The aortic root is normal in size and structure. Venous: The inferior vena cava is normal in size with greater than 50% respiratory variability, suggesting right atrial pressure of 3 mmHg. IAS/Shunts: No atrial level shunt detected by color flow Doppler.  LEFT VENTRICLE PLAX 2D LVIDd:         4.50 cm     Diastology LVIDs:         3.50 cm     LV e' medial:    5.55 cm/s LV PW:          1.00 cm     LV E/e' medial:  13.0 LV IVS:        1.00 cm     LV e' lateral:   6.42 cm/s LVOT diam:     1.80 cm     LV E/e' lateral: 11.2 LV SV:         34 LV SV Index:   21 LVOT Area:     2.54 cm  LV Volumes (MOD) LV vol d, MOD A2C: 82.1 ml LV vol s, MOD A2C: 53.6 ml LV SV MOD A2C:     28.5 ml RIGHT VENTRICLE             IVC RV S prime:     10.30 cm/s  IVC diam: 1.40 cm TAPSE (M-mode): 1.6 cm LEFT ATRIUM             Index       RIGHT ATRIUM          Index LA diam:        3.70 cm 2.30 cm/m  RA Area:     9.15 cm LA Vol (A2C):   36.5 ml 22.69 ml/m RA Volume:   17.50 ml 10.88 ml/m LA Vol (A4C):   35.1 ml 21.82 ml/m LA Biplane Vol: 36.4 ml 22.63 ml/m  AORTIC VALVE LVOT Vmax:   68.50 cm/s LVOT Vmean:  45.300 cm/s LVOT VTI:    0.132 m  AORTA Ao Root diam: 2.60 cm Ao Asc diam:  3.10 cm MITRAL VALVE               TRICUSPID VALVE MV Area (PHT): 3.91 cm    TR Peak grad:   17.0 mmHg MV Decel Time: 194 msec    TR Vmax:        206.00 cm/s MV E velocity: 72.00 cm/s MV A velocity: 76.30 cm/s  SHUNTS MV E/A ratio:  0.94        Systemic VTI:  0.13 m                            Systemic Diam: 1.80 cm Marca Anconaalton Mclean MD Electronically signed by Marca Anconaalton Mclean MD Signature Date/Time: 01/13/2021/2:30:24 PM    Final    Disposition   Pt is being discharged home today in good condition.  Follow-up Plans & Appointments  Follow-up Information    Netta Neat., NP. Go on 01/28/2021.   Specialty: Cardiology Why: @2pm  for hospital follow up. please arrive 15 minutes early  Contact information: [address] [place] [address] [phone]              Discharge Instructions    Diet - low sodium heart healthy   Complete by: As directed    Discharge instructions   Complete by: As directed    No driving for 3 days. No lifting over 10 lbs for 4 weeks. No sexual activity for 4 weeks. You may not return to work until 01/20/21 Keep procedure site clean & dry. If you notice increased pain, swelling,  bleeding or pus, call/return!  You may shower, but no soaking baths/hot tubs/pools for 1 week.   Increase activity slowly   Complete by: As directed       Discharge Medications   Allergies as of 01/14/2021      Reactions   Pollen Extract    Trees, grass, flowers      Medication List    TAKE these medications   ALPRAZolam 0.5 MG tablet Commonly known as: XANAX TAKE 1 TABLET(0.5 MG) BY MOUTH AT BEDTIME AS NEEDED FOR ANXIETY What changed: See the new instructions.   aspirin 81 MG EC tablet Take 1 tablet (81 mg total) by mouth daily. Swallow whole.   benzonatate 100 MG capsule Commonly known as: TESSALON Take 1 capsule (100 mg total) by mouth every 8 (eight) hours.   carvedilol 3.125 MG tablet Commonly known as: COREG Take 1 tablet (3.125 mg total) by mouth 2 (two) times daily with a meal. What changed: See the new instructions.   cholecalciferol 1000 units tablet Commonly known as: VITAMIN D Take 4,000 Units by mouth daily.   fluticasone 50 MCG/ACT nasal spray Commonly known as: FLONASE Place 1 spray into both nostrils daily.   MAGNESIUM ASPARTATE PO Take 1 tablet by mouth daily.   montelukast 10 MG tablet Commonly known as: SINGULAIR TAKE 1 TABLET(10 MG) BY MOUTH AT BEDTIME What changed:   how much to take  how to take this  when to take this  additional instructions   nitroGLYCERIN 0.4 MG SL tablet Commonly known as: NITROSTAT Place 1 tablet (0.4 mg total) under the tongue every 5 (five) minutes as needed for chest pain.   potassium chloride 10 MEQ CR capsule Commonly known as: MICRO-K TAKE 1 CAPSULE(10 MEQ) BY MOUTH DAILY What changed: See the new instructions.   QC TUMERIC COMPLEX PO Take 1 tablet by mouth daily.   rosuvastatin 20 MG tablet Commonly known as: CRESTOR Take 1 tablet (20 mg total) by mouth daily. Start taking on: January 15, 2021          Outstanding Labs/Studies   None  Duration of Discharge Encounter   Greater than 30  minutes including physician time.  Lorelei Pont, PA 01/14/2021, 9:41 AM   Personally seen and examined. Agree with above.   60 year old with Takotsubo cardiomyopathy, stress-induced cardiomyopathy. Cardiac catheterization showed no significant coronary artery disease. Ejection fraction 25 to 30% with typical Takotsubo appearance with basilar sparing of contraction. This was her second time diagnosed with this, last in 2016.  We also treated her hyperlipidemia while she is here.  Blood pressure fairly soft. No ectopy or arrhythmias on monitor. Feels well ambulating well. No significant dizziness.  She does take Xanax occasionally for her anxiety. Asked her to discuss further anxiety  treatments with primary physician.  Cath site clean dry and intact.  Interestingly during the heart catheterization she did have some catheter induced vasospasm including the proximal RCA which resolved following intracoronary nitroglycerin.  Okay for discharge. Low-dose carvedilol 3.125 twice a day. Unable to tolerate higher doses secondary to blood pressures. Understands not to involve herself in any heavy physical activity over the next several weeks. We discussed.  Close follow-up in the next 2 weeks.  Donato Schultz, MD

## 2021-01-14 NOTE — Telephone Encounter (Signed)
TOC Patient-  Please call Patient-  Pt have an appointment with Rennis Harding on 01-28-21

## 2021-01-14 NOTE — Telephone Encounter (Signed)
D/C today.

## 2021-01-14 NOTE — Progress Notes (Signed)
Patients blood pressure has been dropping overnight, last bp 86/57 (66) HR 70s. Patient denies dizziness, shortness of breath or any pain. Paged Cardiology, got orders to continue to watch patient and decreased coreg dose. Will continue to monitor patient.

## 2021-01-16 NOTE — Telephone Encounter (Signed)
Pt voiced understanding of d/c instructions - BP 90/63 yesterday 108/65 today - HR remains in the 70s - some swelling and bruising in right arm at cath site but not bothersome - denies any other symptoms at this time - pt aware to contact us back with any questions or worsening symptoms - has appt with Nena Polio, NP 2/22

## 2021-01-17 ENCOUNTER — Telehealth: Payer: Self-pay | Admitting: Family Medicine

## 2021-01-17 NOTE — Telephone Encounter (Signed)
Returning call to [facility]

## 2021-01-17 NOTE — Telephone Encounter (Signed)
Left message for patient to return call.

## 2021-01-17 NOTE — Telephone Encounter (Signed)
Attempted to call patient back. Mailbox is now full unable to leave message. Each time I call it rings once and goes straight to voicemail.

## 2021-01-17 NOTE — Telephone Encounter (Signed)
NEW MESSAGE    PATIENT RETURNING CALL , NOT SURE WHO CALLED HER OR WHY. She did want to let you know she is having weakness and dizziness

## 2021-01-21 ENCOUNTER — Other Ambulatory Visit: Payer: Self-pay

## 2021-01-21 ENCOUNTER — Ambulatory Visit (INDEPENDENT_AMBULATORY_CARE_PROVIDER_SITE_OTHER): Payer: Medicare Other | Admitting: Internal Medicine

## 2021-01-21 ENCOUNTER — Encounter (INDEPENDENT_AMBULATORY_CARE_PROVIDER_SITE_OTHER): Payer: Self-pay | Admitting: Internal Medicine

## 2021-01-21 VITALS — BP 110/64 | HR 72 | Ht 64.0 in | Wt 131.2 lb

## 2021-01-21 DIAGNOSIS — I10 Essential (primary) hypertension: Secondary | ICD-10-CM | POA: Diagnosis not present

## 2021-01-21 DIAGNOSIS — E782 Mixed hyperlipidemia: Secondary | ICD-10-CM

## 2021-01-21 DIAGNOSIS — I5181 Takotsubo syndrome: Secondary | ICD-10-CM

## 2021-01-21 NOTE — Progress Notes (Signed)
Metrics: Intervention Frequency ACO  Documented Smoking Status Yearly  Screened one or more times in 24 months  Cessation Counseling or  Active cessation medication Past 24 months  Past 24 months   Guideline developer: UpToDate (See UpToDate for funding source) Date Released: 2014       Wellness Office Visit  Subjective:  Patient ID: Sara Mcmahon, female    DOB: 11-03-1961  Age: 60 y.o. MRN: [id number]  CC: This lady comes in after recent hospitalization for non-ST elevation MI. HPI  It was felt that this was likely due to vasospasm.  Coronary arteries were unremarkable and she had nonobstructive coronary artery disease on cardiac catheterization. She was started on statin therapy again. She continues on carvedilol. Overall, she is doing well but she was recommended to avoid extremes in emotions, which may lead to stress. Past Medical History:  Diagnosis Date  . Cardiomyopathy (HCC)   . CHF (congestive heart failure) (HCC)   . Fractures   . GERD (gastroesophageal reflux disease)   . Hepatitis   . HLD (hyperlipidemia) 10/19/2019  . Hypercholesteremia   . Insomnia   . Takotsubo cardiomyopathy 10/19/2019  . Vitiligo    Past Surgical History:  Procedure Laterality Date  . CARDIAC CATHETERIZATION    . CARDIAC CATHETERIZATION    . CESAREAN SECTION    . LEFT HEART CATH AND CORONARY ANGIOGRAPHY N/A 01/13/2021   Procedure: LEFT HEART CATH AND CORONARY ANGIOGRAPHY;  Surgeon: Yvonne Kendall, MD;  Location: [facility] INVASIVE CV LAB;  Service: Cardiovascular;  Laterality: N/A;     Family History  Problem Relation Age of Onset  . Hypertension Mother   . Cancer Mother   . Heart disease Father   . 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   Social History   Tobacco Use  . Smoking status: Never Smoker  . Smokeless tobacco:  Never Used  Substance Use Topics  . Alcohol use: Never    Alcohol/week: 0.0 standard drinks    Current Meds  Medication Sig  . ALPRAZolam (XANAX) 0.5 MG tablet TAKE 1 TABLET(0.5 MG) BY MOUTH AT BEDTIME AS NEEDED FOR ANXIETY (Patient taking differently: Take 0.5 mg by mouth at bedtime. TAKE 1 TABLET(0.5 MG) BY MOUTH AT BEDTIME AS NEEDED FOR ANXIETY)  . aspirin EC 81 MG EC tablet Take 1 tablet (81 mg total) by mouth daily. Swallow whole.  . carvedilol (COREG) 3.125 MG tablet Take 1 tablet (3.125 mg total) by mouth 2 (two) times daily with a meal.  . cholecalciferol (VITAMIN D) 1000 units tablet Take 4,000 Units by mouth daily.  Marland Kitchen MAGNESIUM ASPARTATE PO Take 1 tablet by mouth daily.  . montelukast (SINGULAIR) 10 MG tablet TAKE 1 TABLET(10 MG) BY MOUTH AT BEDTIME (Patient taking differently: Take 10 mg by mouth every other day.)  . nitroGLYCERIN (NITROSTAT) 0.4 MG SL tablet Place 1 tablet (0.4 mg total) under the tongue every 5 (five) minutes as needed for chest pain.  . potassium chloride (MICRO-K) 10 MEQ CR capsule TAKE 1 CAPSULE(10 MEQ) BY MOUTH DAILY (Patient taking differently: Take 10 mEq by mouth daily.)  . rosuvastatin (CRESTOR) 20 MG tablet Take 1 tablet (20 mg total) by mouth daily.  . Turmeric (QC TUMERIC COMPLEX PO) Take 1 tablet by mouth daily.       Objective:   Today's Vitals: BP  110/64   Pulse 72   Ht 5\' 4"  (1.626 m)   Wt 131 lb 3.2 oz (59.5 kg)   BMI 22.52 kg/m  Vitals with BMI 01/21/2021 01/14/2021 01/14/2021  Height 5\' 4"  - -  Weight 131 lbs 3 oz - 126 lbs 2 oz  BMI 22.51 - 21.63  Systolic 110 103 93  Diastolic 64 60 66  Pulse 72 73 71     Physical Exam   She looks systemically well.  Blood pressure is excellent.  No other physical findings.    Assessment   1. Takotsubo cardiomyopathy   2. Essential hypertension, benign   3. Mixed hyperlipidemia       Tests ordered No orders of the defined types were placed in this encounter.    Plan: 1. She  will continue with all medications as above. 2. We discussed meditation as a form to prevent stress and I discussed with her how to meditate practically on a daily basis. 3. Follow-up with 03/14/2021 in about 4 months.  I spent 30 minutes with this patient reviewing her hospitalization records and discussing her medications as well as the possibility of using SSRI for anxiety and also discussing meditation.   No orders of the defined types were placed in this encounter.   , MD

## 2021-01-25 ENCOUNTER — Other Ambulatory Visit (INDEPENDENT_AMBULATORY_CARE_PROVIDER_SITE_OTHER): Payer: Self-pay | Admitting: Internal Medicine

## 2021-01-27 NOTE — Progress Notes (Addendum)
Cardiology Office Note  Date: 01/28/2021   ID: Sara, Mcmahon Jan 08, 1961, MRN [id number]  PCP:  Wilson Singer, MD  Cardiologist:  Dina Rich, MD Electrophysiologist:  None   Chief Complaint: Hospital follow up   History of Present Illness: Sara Mcmahon is a 60 y.o. female with a history of NSTEMI, Takatsubo cardiomyopathy, CHF, GERD, HTN, HLD, anxiety.  Previously seen by Dr. Purvis Sheffield 06/15/2019 for history of Takotsubo cardiomyopathy, PSVT, hyperlipidemia, hypertension.  She occasionally had some exertional fatigue.  History of anxiety taking Xanax at night to help her sleep.  She denied any chest pain or palpitations.  She experienced occasional dizziness with approximately once a week.  Previous occurrence of Takotsubo's cardiomyopathy in 2016 had resolved and she was symptomatically stable.  Dr. Purvis Sheffield weaned her off carvedilol for complaints of dizziness.  She was continuing her Crestor for hyperlipidemia.  Hypertension was controlled on low-dose HCTZ.  She presented to [facility] on 01/12/2021 with chest pain.  She was transferred to [facility] for management of suspected NSTEMI.  An echocardiogram revealed LV function of 25 to 30% with G1 DD and Takotsubo type wall motion abnormality.  Cardiac catheterization mild nonobstructive CAD and catheter induced coronary vasospasm of proximal RCA relieved by intracoronary nitroglycerin.  She was to continue aspirin 81 mg and Crestor.  She was discharged on carvedilol 3.125 mg p.o. twice daily.  Crestor was started during admission due to LDL of 147.  She is here today for hospital follow-up.  States she feels reasonably well today.  She denies any anginal or exertional symptoms, orthostatic symptoms, DOE or SOB.  No palpitations or arrhythmias, blood pressure well controlled.  Blood pressure today 110/64.  She described having recent issues  with her son-in-law who has significant issues with bipolar  disorder, some enjoyable stressful situations at her church, recently had to move from her home which was sudden notice which was stressful.  She describes all of these situations likely contributing to this recent hospitalization and recurrent diagnosis of Takotsubo's cardiomyopathy.  Mentioned due to her recent multiple stressors she may want to temporarily increase her alprazolam to include a morning dose until these stressful situations have resolved.  She states she will talk to her PCP Dr. Karilyn Cota.  Past Medical History:  Diagnosis Date  . Cardiomyopathy (HCC)   . CHF (congestive heart failure) (HCC)   . Fractures   . GERD (gastroesophageal reflux disease)   . Hepatitis   . HLD (hyperlipidemia) 10/19/2019  . Hypercholesteremia   . Insomnia   . Takotsubo cardiomyopathy 10/19/2019  . Vitiligo     Past Surgical History:  Procedure Laterality Date  . CARDIAC CATHETERIZATION    . CARDIAC CATHETERIZATION    . CESAREAN SECTION    . LEFT HEART CATH AND CORONARY ANGIOGRAPHY N/A 01/13/2021   Procedure: LEFT HEART CATH AND CORONARY ANGIOGRAPHY;  Surgeon: Yvonne Kendall, MD;  Location: [facility];  Service: Cardiovascular;  Laterality: N/A;    Current Outpatient Medications  Medication Sig Dispense Refill  . ALPRAZolam (XANAX) 0.5 MG tablet Take 1 tablet (0.5 mg total) by mouth at bedtime. TAKE 1 TABLET(0.5 MG) BY MOUTH AT BEDTIME AS NEEDED FOR ANXIETY 30 tablet 3  . aspirin EC 81 MG EC tablet Take 1 tablet (81 mg total) by mouth daily. Swallow whole. 30 tablet 11  . carvedilol (COREG) 3.125 MG tablet Take 1 tablet (3.125 mg total) by mouth 2 (two) times daily with a meal.  180 tablet 2  . cholecalciferol (VITAMIN D) 1000 units tablet Take 4,000 Units by mouth daily.    Marland Kitchen MAGNESIUM ASPARTATE PO Take 1 each by mouth daily.    . montelukast (SINGULAIR) 10 MG tablet TAKE 1 TABLET(10 MG) BY MOUTH AT BEDTIME (Patient taking differently: Take 10 mg by mouth every other day.) 90 tablet 0   . nitroGLYCERIN (NITROSTAT) 0.4 MG SL tablet Place 1 tablet (0.4 mg total) under the tongue every 5 (five) minutes as needed for chest pain. 25 tablet 3  . potassium chloride (MICRO-K) 10 MEQ CR capsule TAKE 1 CAPSULE(10 MEQ) BY MOUTH DAILY (Patient taking differently: Take 10 mEq by mouth daily.) 30 capsule 3  . rosuvastatin (CRESTOR) 20 MG tablet Take 1 tablet (20 mg total) by mouth daily. 30 tablet 6  . Turmeric (QC TUMERIC COMPLEX PO) Take 1 tablet by mouth daily.     No current facility-administered medications for this visit.   Allergies:  Pollen extract   Social History: The patient  reports that she has never smoked. She has never used smokeless tobacco. She reports that she does not drink alcohol and does not use drugs.   Family History: The patient's family history includes Cancer in her mother; Heart disease in her father; Hypertension in her father and mother.   ROS:  Please see the history of present illness. Otherwise, complete review of systems is positive for none.  All other systems are reviewed and negative.   Physical Exam: VS:  BP 110/64   Pulse 60   Ht 5\' 4"  (1.626 m)   Wt 132 lb (59.9 kg)   SpO2 98%   BMI 22.66 kg/m , BMI Body mass index is 22.66 kg/m.  Wt Readings from Last 3 Encounters:  01/28/21 132 lb (59.9 kg)  01/21/21 131 lb 3.2 oz (59.5 kg)  01/14/21 126 lb 1.6 oz (57.2 kg)    General: Patient appears comfortable at rest. Neck: Supple, no elevated JVP or carotid bruits, no thyromegaly. Lungs: Clear to auscultation, nonlabored breathing at rest. Cardiac: Regular rate and rhythm, no S3 or significant systolic murmur, no pericardial rub. Extremities: No pitting edema, distal pulses 2+. Skin: Warm and dry. Musculoskeletal: No kyphosis. Neuropsychiatric: Alert and oriented x3, affect grossly appropriate.  ECG:  EKG January 14, 2021 normal sinus rhythm rate of 67, T wave abnormality consider inferior ischemia, consider anterolateral ischemia.   Prolonged QT T compared with QT has lengthened, anterior lateral T wave inversions now present.  Recent Labwork: 05/29/2020: ALT 15; AST 20 01/12/2021: B Natriuretic Peptide 194.2 01/14/2021: BUN 15; Creatinine, Ser 0.90; Hemoglobin 11.1; Platelets 225; Potassium 3.6; Sodium 139     Component Value Date/Time   CHOL 225 (H) 05/29/2020 1216   TRIG 171 (H) 05/29/2020 1216   HDL 47 (L) 05/29/2020 1216   CHOLHDL 4.8 05/29/2020 1216   LDLCALC 147 (H) 05/29/2020 1216   LDLDIRECT 162.0 (H) 01/12/2021 1600    Other Studies Reviewed Today:  Cardiac catheterization 01/13/2021 Conclusions: 1. Mild, non-obstructive coronary artery disease with 30% proximal LAD disease as well as catheter-induced vasospasm involving the proximal RCA that improved from 95% to 10-20% following administration of intracoronary nitroglycerin. 2. Severely reduced left-ventricular contraction with wall motion abnormality consistent with Takotsubo variant. 3. Normal left ventricular filling pressure.  Recommendations: 1. Optimize goal directed medical therapy for acute systolic heart failure due to stress-induced cardiomyopathy. 2. Medical therapy and risk factor modification to prevent progression of atherosclerotic coronary artery disease. Diagnostic Dominance: Right  Echocardiogram 01/13/2021  1. Left ventricular ejection fraction, by estimation, is 25 to 30%. The left ventricle has severely decreased function. The left ventricle demonstrates regional wall motion abnormalities with severe hypokinesis to akinesis of the mid and apical LV segments, relative preservation of the basal segments and the apical cap. Need to rule out coronary disease, but stress (Takotsubo-type) cardiomyopathy is certainly possible. Left ventricular diastolic parameters are consistent with Grade I diastolic dysfunction (impaired relaxation). 2. Right ventricular systolic function is normal. The right ventricular size is normal. There  is normal pulmonary artery systolic pressure. The estimated right ventricular systolic pressure is 20.0 mmHg. 3. The mitral valve is normal in structure. Trivial mitral valve regurgitation. No evidence of mitral stenosis. 4. The aortic valve is tricuspid. Aortic valve regurgitation is not visualized. No aortic stenosis is present. 5. The inferior vena cava is normal in size with greater than 50% respiratory variability, suggesting right atrial pressure of 3 mmHg.   Assessment and Plan:  1. Takotsubo cardiomyopathy   2. NSTEMI (non-ST elevated myocardial infarction) (HCC)   3. Essential hypertension, benign   4. Mixed hyperlipidemia    1. Takotsubo cardiomyopathy Recent presentation with chest pain/chest tightness and EKG changes with elevated cardiac enzymes. Cardiac catheterization revealed mild nonobstructive CAD with 30% proximal LAD as well as catheter induced vasospasm involving proximal RCA improved from 95% to 10 to 20% following administration of intracoronary nitroglycerin.  Severely reduced LV contraction with wall motion abnormality consistent with toxic both parents.  Continue carvedilol 3.125 mg p.o. twice daily.  Get follow-up echocardiogram 6 to 8 weeks.    2. NSTEMI (non-ST elevated myocardial infarction) (HCC) No anginal or exertional symptoms.  Continue aspirin 81 mg daily.  Continue nitroglycerin sublingual as needed.  3. Essential hypertension, benign Blood pressure well controlled today with BP 110/64.  Continue carvedilol 3.125 mg p.o. twice daily.  4. Mixed hyperlipidemia Continue Crestor 20 mg p.o. daily.  5.  Anxiety/stress Patient takes 0.5 mg Xanax in the evenings to help with sleep.  Given her recent stressful situations I recommended she speak with her primary care provider to possibly temporarily in the short-term prescribe an a.m. as needed dose of Xanax until these significant stressors in her life resolve.  She states she will speak to her  PCP  Medication Adjustments/Labs and Tests Ordered: Current medicines are reviewed at length with the patient today.  Concerns regarding medicines are outlined above.   Disposition: Follow-up with Dr. Wyline Mood or APP 2 months  Signed, Rennis Harding, NP 01/28/2021 2:13 PM    [facility] at [facility] [address] [place], [place], [place] [address] Phone: [phone]; Fax: [phone]

## 2021-01-28 ENCOUNTER — Encounter: Payer: Self-pay | Admitting: Family Medicine

## 2021-01-28 ENCOUNTER — Ambulatory Visit: Payer: Medicare Other | Admitting: Family Medicine

## 2021-01-28 VITALS — BP 110/64 | HR 60 | Ht 64.0 in | Wt 132.0 lb

## 2021-01-28 DIAGNOSIS — I214 Non-ST elevation (NSTEMI) myocardial infarction: Secondary | ICD-10-CM | POA: Diagnosis not present

## 2021-01-28 DIAGNOSIS — I10 Essential (primary) hypertension: Secondary | ICD-10-CM

## 2021-01-28 DIAGNOSIS — I5181 Takotsubo syndrome: Secondary | ICD-10-CM

## 2021-01-28 DIAGNOSIS — E782 Mixed hyperlipidemia: Secondary | ICD-10-CM

## 2021-01-28 NOTE — Patient Instructions (Addendum)
Medication Instructions:   Your physician recommends that you continue on your current medications as directed. Please refer to the Current Medication list given to you today.  Labwork:  none  Testing/Procedures: Your physician has requested that you have an echocardiogram in 6-8 weeks before next visit. Echocardiography is a painless test that uses sound waves to create images of your heart. It provides your doctor with information about the size and shape of your heart and how well your heart's chambers and valves are working. This procedure takes approximately one hour. There are no restrictions for this procedure.  Follow-Up:  Your physician recommends that you schedule a follow-up appointment in: 2 months.  Any Other Special Instructions Will Be Listed Below (If Applicable).  If you need a refill on your cardiac medications before your next appointment, please call your pharmacy.

## 2021-03-06 ENCOUNTER — Other Ambulatory Visit: Payer: Self-pay | Admitting: Family Medicine

## 2021-03-06 DIAGNOSIS — I5181 Takotsubo syndrome: Secondary | ICD-10-CM

## 2021-03-06 DIAGNOSIS — I214 Non-ST elevation (NSTEMI) myocardial infarction: Secondary | ICD-10-CM

## 2021-03-26 ENCOUNTER — Ambulatory Visit (INDEPENDENT_AMBULATORY_CARE_PROVIDER_SITE_OTHER): Payer: Medicare Other

## 2021-03-26 DIAGNOSIS — I214 Non-ST elevation (NSTEMI) myocardial infarction: Secondary | ICD-10-CM

## 2021-03-26 DIAGNOSIS — I5181 Takotsubo syndrome: Secondary | ICD-10-CM

## 2021-03-26 LAB — ECHOCARDIOGRAM COMPLETE
AR max vel: 1.67 cm2
AV Area VTI: 2.1 cm2
AV Area mean vel: 1.93 cm2
AV Mean grad: 3.5 mmHg
AV Peak grad: 7.8 mmHg
Ao pk vel: 1.4 m/s
Area-P 1/2: 3.23 cm2
Calc EF: 60.9 %
MV M vel: 4.73 m/s
MV Peak grad: 89.4 mmHg
S' Lateral: 3 cm
Single Plane A2C EF: 63.5 %
Single Plane A4C EF: 59.1 %

## 2021-03-28 ENCOUNTER — Telehealth: Payer: Self-pay | Admitting: *Deleted

## 2021-03-28 NOTE — Telephone Encounter (Signed)
ZELIE ASBILL, LPN  12/21/5206 0:22 PM EDT Back to Top     Notified, copy to pcp. States that she may be not able to tolerate the Crestor - informed her that she can discuss further with provider at her follow up scheduled for next week on 04/03/2021. She verbalized understanding.    KARLEY PHO, LPN  3/36/1224 4:97 PM EDT      Left message to return call.   Netta Neat., NP  03/26/2021 2:02 PM EDT      Please call the patient and let her know the pumping function of her heart has returned to normal. Pumping function is now 60-65% This is normal. Her previous pumping function was 25-30% . She has a mildy leaking valve on the left side. Everything looks good. Much improved. She should be feeling much better. Thanks

## 2021-03-31 ENCOUNTER — Ambulatory Visit
Admission: EM | Admit: 2021-03-31 | Discharge: 2021-03-31 | Disposition: A | Discharge status: Home / Self Care | Payer: Medicare Other | Attending: Family Medicine | Admitting: Family Medicine

## 2021-03-31 ENCOUNTER — Encounter: Payer: Self-pay | Admitting: Emergency Medicine

## 2021-03-31 ENCOUNTER — Other Ambulatory Visit: Payer: Self-pay

## 2021-03-31 DIAGNOSIS — R059 Cough, unspecified: Secondary | ICD-10-CM | POA: Diagnosis not present

## 2021-03-31 DIAGNOSIS — J209 Acute bronchitis, unspecified: Secondary | ICD-10-CM

## 2021-03-31 MED ORDER — DOXYCYCLINE HYCLATE 100 MG PO CAPS: 100.0000 mg | ORAL_CAPSULE | Freq: Two times a day (BID) | ORAL | 0 refills | Status: AC

## 2021-03-31 MED ORDER — PREDNISONE 20 MG PO TABS: 20.0000 mg | ORAL_TABLET | Freq: Every day | ORAL | 0 refills | Status: AC

## 2021-03-31 MED ORDER — PREDNISONE 20 MG PO TABS: 40.0000 mg | ORAL_TABLET | Freq: Every day | ORAL | 0 refills | Status: DC

## 2021-03-31 MED ORDER — BENZONATATE 200 MG PO CAPS: 200.0000 mg | ORAL_CAPSULE | Freq: Three times a day (TID) | ORAL | 0 refills | Status: DC | PRN

## 2021-03-31 NOTE — ED Provider Notes (Signed)
[facility]    CSN: [id number] Arrival date & time: 03/31/21  1813      History   Chief Complaint Chief Complaint  Patient presents with  . Cough    HPI Sara Mcmahon is a 60 y.o. female.   HPI  Patient presents with concern for symptoms of nasal congestion,  and severe persistent cough. She has low grade temperature on arrival today. Unaware of any sick contacts.  Cough is occasionally productive. She is mildly tachycardic at present. Endorses fatigue. She has taken OTC medication without relief of symptoms. Patients medical problems are significant for CHF and Takotsubo cardiomyopathy.  Remainder of Review of Systems negative except as noted in the HPI.   Past Medical History:  Diagnosis Date  . Cardiomyopathy (HCC)   . CHF (congestive heart failure) (HCC)   . Fractures   . GERD (gastroesophageal reflux disease)   . Hepatitis   . HLD (hyperlipidemia) 10/19/2019  . Hypercholesteremia   . Insomnia   . Takotsubo cardiomyopathy 10/19/2019  . Vitiligo     Patient Active Problem List   Diagnosis Date Noted  . NSTEMI (non-ST elevated myocardial infarction) (HCC) 01/12/2021  . ACS (acute coronary syndrome) (HCC) 01/12/2021  . Essential hypertension, benign 10/19/2019  . Takotsubo cardiomyopathy 10/19/2019  . HLD (hyperlipidemia) 10/19/2019    Past Surgical History:  Procedure Laterality Date  . CARDIAC CATHETERIZATION    . CARDIAC CATHETERIZATION    . CESAREAN SECTION    . LEFT HEART CATH AND CORONARY ANGIOGRAPHY N/A 01/13/2021   Procedure: LEFT HEART CATH AND CORONARY ANGIOGRAPHY;  Surgeon: Yvonne Kendall, MD;  Location: [facility] CV LAB;  Service: Cardiovascular;  Laterality: N/A;    OB History    Gravida  2   Para  2   Term  2   Preterm      AB      Living        SAB      IAB      Ectopic      Multiple      Live Births               Home Medications    Prior to Admission medications   Medication Sig Start Date End  Date Taking? Authorizing Provider  benzonatate (TESSALON) 200 MG capsule Take 1 capsule (200 mg total) by mouth 3 (three) times daily as needed for cough. 03/31/21  Yes Bing Neighbors, FNP  doxycycline (VIBRAMYCIN) 100 MG capsule Take 1 capsule (100 mg total) by mouth 2 (two) times daily for 10 days. 03/31/21 04/10/21 Yes Bing Neighbors, FNP  ALPRAZolam Prudy Feeler) 0.5 MG tablet Take 1 tablet (0.5 mg total) by mouth at bedtime. TAKE 1 TABLET(0.5 MG) BY MOUTH AT BEDTIME AS NEEDED FOR ANXIETY 01/27/21   Lilly Cove C, MD  aspirin EC 81 MG EC tablet Take 1 tablet (81 mg total) by mouth daily. Swallow whole. 01/14/21   Manson Passey, PA  carvedilol (COREG) 3.125 MG tablet Take 1 tablet (3.125 mg total) by mouth 2 (two) times daily with a meal. 01/14/21   Bhagat, Bhavinkumar, PA  cholecalciferol (VITAMIN D) 1000 units tablet Take 4,000 Units by mouth daily.    [provider]  MAGNESIUM ASPARTATE PO Take 1 each by mouth daily.    [provider]  montelukast (SINGULAIR) 10 MG tablet TAKE 1 TABLET(10 MG) BY MOUTH AT BEDTIME Patient taking differently: Take 10 mg by mouth every other day. 09/11/20   Gosrani,  Nimish C, MD  nitroGLYCERIN (NITROSTAT) 0.4 MG SL tablet Place 1 tablet (0.4 mg total) under the tongue every 5 (five) minutes as needed for chest pain. 10/19/19   Lilly Cove C, MD  potassium chloride (MICRO-K) 10 MEQ CR capsule TAKE 1 CAPSULE(10 MEQ) BY MOUTH DAILY Patient taking differently: Take 10 mEq by mouth daily. 12/09/20   Wilson Singer, MD  predniSONE (DELTASONE) 20 MG tablet Take 1 tablet (20 mg total) by mouth daily with breakfast for 5 days. 03/31/21 04/05/21  Bing Neighbors, FNP  rosuvastatin (CRESTOR) 20 MG tablet Take 1 tablet (20 mg total) by mouth daily. 01/15/21   Bhagat, Sharrell Ku, PA  Turmeric (QC TUMERIC COMPLEX PO) Take 1 tablet by mouth daily.    [provider]    Family History Family History  Problem Relation Age of Onset  .  Hypertension Mother   . Cancer Mother   . Heart disease Father   . Hypertension Father     Social History Social History   Tobacco Use  . Smoking status: Never Smoker  . Smokeless tobacco: Never Used  Vaping Use  . Vaping Use: Never used  Substance Use Topics  . Alcohol use: Never    Alcohol/week: 0.0 standard drinks  . Drug use: No     Allergies   Pollen extract   Review of Systems Review of Systems Pertinent negatives listed in HPI   Physical Exam Triage Vital Signs ED Triage Vitals  Enc Vitals Group     BP 03/31/21 1854 117/65     Pulse Rate 03/31/21 1854 (!) 103     Resp 03/31/21 1854 17     Temp 03/31/21 1854 99.1 F (37.3 C)     Temp Source 03/31/21 1854 Oral     SpO2 03/31/21 1854 95 %     Weight --      Height --      Head Circumference --      Peak Flow --      Pain Score 03/31/21 1859 0     Pain Loc --      Pain Edu? --      Excl. in GC? --    No data found.  Updated Vital Signs BP 117/65 (BP Location: Right Arm)   Pulse (!) 103   Temp 99.1 F (37.3 C) (Oral)   Resp 17   SpO2 95%   Visual Acuity Right Eye Distance:   Left Eye Distance:   Bilateral Distance:    Right Eye Near:   Left Eye Near:    Bilateral Near:     Physical Exam General appearance: alert, Ill-appearing, no distress Head: Normocephalic, without obvious abnormality, atraumatic ENT: Ears normal, mucosal edema, congestion, oropharynx w/o exudate Respiratory: Respirations even , unlabored, coarse lung sound, expiratory wheeze Heart: rate and rhythm normal. No gallop or murmurs noted on exam  Abdomen: BS +, no distention, no rebound tenderness, or no mass Extremities: No gross deformities Skin: Skin color, texture, turgor normal. No rashes seen  Psych: Appropriate mood and affect. Neurologic: GCS 15, normal gait, normal coordination  UC Treatments / Results  Labs (all labs ordered are listed, but only abnormal results are displayed) Labs Reviewed  COVID-19, FLU  A+B NAA    EKG   Radiology No results found.  Procedures Procedures (including critical care time)  Medications Ordered in UC Medications - No data to display  Initial Impression / Assessment and Plan / UC Course  I have reviewed the triage  vital signs and the nursing notes.  Pertinent labs & imaging results that were available during my care of the patient were reviewed by me and considered in my medical decision making (see chart for details).    Acute bronchitis, with persistent cough. Treatment per discharge medications.  COVID/flu test pending. ER precautions if symptoms become severe or CP develops. Follow-up with PCP as needed. Final Clinical Impressions(s) / UC Diagnoses   Final diagnoses:  Cough  Acute bronchitis, unspecified organism     Discharge Instructions     Your COVID 19 results will be available in 24-48 hours. Negative results are immediately resulted to Mychart. Positive results will receive a follow-up call from our clinic. If symptoms are present, I recommend home quarantine until results are known.    ED Prescriptions    Medication Sig Dispense Auth. Provider   doxycycline (VIBRAMYCIN) 100 MG capsule Take 1 capsule (100 mg total) by mouth 2 (two) times daily for 10 days. 20 capsule Bing Neighbors, FNP   predniSONE (DELTASONE) 20 MG tablet  (Status: Discontinued) Take 2 tablets (40 mg total) by mouth daily with breakfast. 10 tablet Bing Neighbors, FNP   benzonatate (TESSALON) 200 MG capsule Take 1 capsule (200 mg total) by mouth 3 (three) times daily as needed for cough. 30 capsule Bing Neighbors, FNP   predniSONE (DELTASONE) 20 MG tablet Take 1 tablet (20 mg total) by mouth daily with breakfast for 5 days. 5 tablet Bing Neighbors, FNP     PDMP not reviewed this encounter.   Bing Neighbors, FNP 04/03/21 1217

## 2021-03-31 NOTE — ED Triage Notes (Signed)
Cough, sinus congestion x 1 week, worse at night . No relief with otc meds.

## 2021-03-31 NOTE — Discharge Instructions (Addendum)
Your COVID 19 results will be available in 24-48 hours. Negative results are immediately resulted to Mychart. Positive results will receive a follow-up call from our clinic. If symptoms are present, I recommend home quarantine until results are known.  ?

## 2021-04-02 LAB — COVID-19, FLU A+B NAA
Influenza A, NAA: NOT DETECTED
Influenza B, NAA: NOT DETECTED
SARS-CoV-2, NAA: NOT DETECTED

## 2021-04-02 NOTE — Progress Notes (Signed)
Cardiology Office Note  Date: 04/03/2021   ID: Sara Mcmahon, DOB 11/07/1961, MRN [id number]  PCP:  Wilson SingerGosrani, Nimish C, MD  Cardiologist:  Dina RichBranch, Jonathan, MD Electrophysiologist:  None   Chief Complaint: Hospital follow up   History of Present Illness: Sara Mcmahon is a 60 y.o. female with a history of NSTEMI, Takatsubo cardiomyopathy, CHF, GERD, HTN, HLD, anxiety.  Previously seen by Dr. Purvis SheffieldKoneswaran 06/15/2019 for history of Takotsubo cardiomyopathy, PSVT, hyperlipidemia, hypertension.  She occasionally had some exertional fatigue.  History of anxiety taking Xanax at night to help her sleep.  She denied any chest pain or palpitations.  She experienced occasional dizziness with approximately once a week.  Previous occurrence of Takotsubo's cardiomyopathy in 2016 had resolved and she was symptomatically stable.  Dr. Purvis SheffieldKoneswaran weaned her off carvedilol for complaints of dizziness.  She was continuing her Crestor for hyperlipidemia.  Hypertension was controlled on low-dose HCTZ.  She presented to [facility] on 01/12/2021 with chest pain.  [facility] was [facility] management of suspected NSTEMI.  An echocardiogram revealed LV function of 25 to 30% with G1 DD and Takotsubo type wall motion abnormality.  Cardiac catheterization mild nonobstructive CAD and catheter induced coronary vasospasm of proximal RCA relieved by intracoronary nitroglycerin.  She was to continue aspirin 81 mg and Crestor.  She was discharged on carvedilol 3.125 mg p.o. twice daily.  Crestor was started during admission due to LDL of 147.  She is here today for follow-up status post  Recent follow-up echocardiogram on 03/26/2021 showed significant improvement in EF.  EF now 60 to 65%.  No WMA's.  G1 DD.  Mild MR.recent diagnosis of Takotsubo's cardiomyopathy.  She denies any anginal or exertional symptoms.  She did have a recent visit to urgent care for right-sided pleuritic-like chest pain, nasal  congestion, severe persistent cough, low-grade temperature, fatigue, mildly tachycardic with heart rate of 103.  She was diagnosed with acute bronchitis with persistent cough.  Her COVID and flu testing were all normal.  She was prescribed doxycycline, prednisone, Tessalon Perles.  She endorses some continuing fatigue.  She denies any current anginal or exertional symptoms, racing heart or palpitations.  No orthostatic symptoms, CVA or TIA-like symptoms, PND, orthopnea.  She is voicing concerns over taking Crestor.  We discussed the fact that her recent direct LDL was 162.     Past Medical History:  Diagnosis Date  . Cardiomyopathy (HCC)   . CHF (congestive heart failure) (HCC)   . Fractures   . GERD (gastroesophageal reflux disease)   . Hepatitis   . HLD (hyperlipidemia) 10/19/2019  . Hypercholesteremia   . Insomnia   . Takotsubo cardiomyopathy 10/19/2019  . Vitiligo     Past Surgical History:  Procedure Laterality Date  . CARDIAC CATHETERIZATION    . CARDIAC CATHETERIZATION    . CESAREAN SECTION    . LEFT HEART CATH AND CORONARY ANGIOGRAPHY N/A 01/13/2021   Procedure: LEFT HEART CATH AND CORONARY ANGIOGRAPHY;  Surgeon: Yvonne KendallEnd, Christopher, MD;  Location: [facility] INVASIVE CV LAB;  Service: Cardiovascular;  Laterality: N/A;    Current Outpatient Medications  Medication Sig Dispense Refill  . ALPRAZolam (XANAX) 0.5 MG tablet Take 1 tablet (0.5 mg total) by mouth at bedtime. TAKE 1 TABLET(0.5 MG) BY MOUTH AT BEDTIME AS NEEDED FOR ANXIETY 30 tablet 3  . aspirin EC 81 MG EC tablet Take 1 tablet (81 mg total) by mouth daily. Swallow whole. 30 tablet 11  . benzonatate (  TESSALON) 200 MG capsule Take 1 capsule (200 mg total) by mouth 3 (three) times daily as needed for cough. 30 capsule 0  . carvedilol (COREG) 3.125 MG tablet Take 1 tablet (3.125 mg total) by mouth 2 (two) times daily with a meal. 180 tablet 2  . cholecalciferol (VITAMIN D) 1000 units tablet Take 4,000 Units by mouth daily.    Marland Kitchen  doxycycline (VIBRAMYCIN) 100 MG capsule Take 1 capsule (100 mg total) by mouth 2 (two) times daily for 10 days. 20 capsule 0  . MAGNESIUM ASPARTATE PO Take 1 each by mouth daily.    . montelukast (SINGULAIR) 10 MG tablet TAKE 1 TABLET(10 MG) BY MOUTH AT BEDTIME (Patient taking differently: Take 10 mg by mouth every other day.) 90 tablet 0  . nitroGLYCERIN (NITROSTAT) 0.4 MG SL tablet Place 1 tablet (0.4 mg total) under the tongue every 5 (five) minutes as needed for chest pain. 25 tablet 3  . potassium chloride (MICRO-K) 10 MEQ CR capsule TAKE 1 CAPSULE(10 MEQ) BY MOUTH DAILY (Patient taking differently: Take 10 mEq by mouth daily.) 30 capsule 3  . predniSONE (DELTASONE) 20 MG tablet Take 1 tablet (20 mg total) by mouth daily with breakfast for 5 days. 5 tablet 0  . rosuvastatin (CRESTOR) 20 MG tablet Take 1 tablet (20 mg total) by mouth daily. 30 tablet 6  . Turmeric (QC TUMERIC COMPLEX PO) Take 1 tablet by mouth daily.     No current facility-administered medications for this visit.   Allergies:  Pollen extract   Social History: The patient  reports that she has never smoked. She has never used smokeless tobacco. She reports that she does not drink alcohol and does not use drugs.   Family History: The patient's family history includes Cancer in her mother; Heart disease in her father; Hypertension in her father and mother.   ROS:  Please see the history of present illness. Otherwise, complete review of systems is positive for none.  All other systems are reviewed and negative.   Physical Exam: VS:  BP 110/68   Pulse 70   Ht 5\' 4"  (1.626 m)   Wt 132 lb 12.8 oz (60.2 kg)   SpO2 98%   BMI 22.80 kg/m , BMI Body mass index is 22.8 kg/m.  Wt Readings from Last 3 Encounters:  04/03/21 132 lb 12.8 oz (60.2 kg)  01/28/21 132 lb (59.9 kg)  01/21/21 131 lb 3.2 oz (59.5 kg)    General: Patient appears comfortable at rest. Neck: Supple, no elevated JVP or carotid bruits, no  thyromegaly. Lungs: Clear to auscultation, nonlabored breathing at rest. Cardiac: Regular rate and rhythm, no S3 or significant systolic murmur, no pericardial rub. Extremities: No pitting edema, distal pulses 2+. Skin: Warm and dry. Musculoskeletal: No kyphosis. Neuropsychiatric: Alert and oriented x3, affect grossly appropriate.  ECG:  EKG January 14, 2021 normal sinus rhythm rate of 67, T wave abnormality consider inferior ischemia, consider anterolateral ischemia.  Prolonged QT T compared with QT has lengthened, anterior lateral T wave inversions now present.  Recent Labwork: 05/29/2020: ALT 15; AST 20 01/12/2021: B Natriuretic Peptide 194.2 01/14/2021: BUN 15; Creatinine, Ser 0.90; Hemoglobin 11.1; Platelets 225; Potassium 3.6; Sodium 139     Component Value Date/Time   CHOL 225 (H) 05/29/2020 1216   TRIG 171 (H) 05/29/2020 1216   HDL 47 (L) 05/29/2020 1216   CHOLHDL 4.8 05/29/2020 1216   LDLCALC 147 (H) 05/29/2020 1216   LDLDIRECT 162.0 (H) 01/12/2021 1600  Other Studies Reviewed Today:   Echocardiogram 03/26/2021  1. Left ventricular ejection fraction, by estimation, is 60 to 65%. The left ventricle has normal function. The left ventricle has no regional wall motion abnormalities. Left ventricular diastolic parameters are consistent with Grade I diastolic dysfunction (impaired relaxation). The average left ventricular global longitudinal strain is 19.2 %. The global longitudinal strain is normal. 2. Right ventricular systolic function is normal. The right ventricular size is normal. 3. Left atrial size was mildly dilated. 4. The mitral valve is normal in structure. Mild mitral valve regurgitation. No evidence of mitral stenosis. 5. The aortic valve is tricuspid. Aortic valve regurgitation is not visualized. No aortic stenosis is present. 6. The inferior vena cava is normal in size with greater than 50% respiratory variability, suggesting right atrial pressure of 3  mmHg. Comparison(s): Echocardiogram done 01/13/21 showed an EF of 25-30%.   Cardiac catheterization 01/13/2021 Conclusions: 1. Mild, non-obstructive coronary artery disease with 30% proximal LAD disease as well as catheter-induced vasospasm involving the proximal RCA that improved from 95% to 10-20% following administration of intracoronary nitroglycerin. 2. Severely reduced left-ventricular contraction with wall motion abnormality consistent with Takotsubo variant. 3. Normal left ventricular filling pressure.  Recommendations: 1. Optimize goal directed medical therapy for acute systolic heart failure due to stress-induced cardiomyopathy. 2. Medical therapy and risk factor modification to prevent progression of atherosclerotic coronary artery disease. Diagnostic Dominance: Right     Echocardiogram 01/13/2021  1. Left ventricular ejection fraction, by estimation, is 25 to 30%. The left ventricle has severely decreased function. The left ventricle demonstrates regional wall motion abnormalities with severe hypokinesis to akinesis of the mid and apical LV segments, relative preservation of the basal segments and the apical cap. Need to rule out coronary disease, but stress (Takotsubo-type) cardiomyopathy is certainly possible. Left ventricular diastolic parameters are consistent with Grade I diastolic dysfunction (impaired relaxation). 2. Right ventricular systolic function is normal. The right ventricular size is normal. There is normal pulmonary artery systolic pressure. The estimated right ventricular systolic pressure is 20.0 mmHg. 3. The mitral valve is normal in structure. Trivial mitral valve regurgitation. No evidence of mitral stenosis. 4. The aortic valve is tricuspid. Aortic valve regurgitation is not visualized. No aortic stenosis is present. 5. The inferior vena cava is normal in size with greater than 50% respiratory variability, suggesting right atrial pressure of 3  mmHg.   Assessment and Plan:  1. Takotsubo cardiomyopathy   2. NSTEMI (non-ST elevated myocardial infarction) (HCC)   3. Essential hypertension, benign   4. Mixed hyperlipidemia    1. Takotsubo cardiomyopathy Presentation in February 2022 with chest pain/chest tightness and EKG changes with elevated cardiac enzymes. Cardiac catheterization revealed mild nonobstructive CAD with 30% proximal LAD as well as catheter induced vasospasm involving proximal RCA improved from 95% to 10 to 20% following administration of intracoronary nitroglycerin.  Severely reduced LV contraction with wall motion abnormality consistent with Takotsubo cardiomyopathy.  Recent follow-up echocardiogram on 03/26/2021 showed significant improvement in EF.  EF now 60 to 65%.  No WMA's.  G1 DD.  Mild MR.  Continue Coreg 3.125 mg p.o. twice daily.   2. NSTEMI (non-ST elevated myocardial infarction) (HCC) No anginal or exertional symptoms.  Continue aspirin 81 mg daily.  Continue nitroglycerin sublingual as needed.  3. Essential hypertension, benign Blood pressure well controlled today with BP 110/68.  Continue carvedilol 3.125 mg p.o. twice daily.  4. Mixed hyperlipidemia Continue Crestor 20 mg p.o. daily.  She states she is concerned about  taking the Crestor.  She has been feeling more tired recently and having some stomach discomfort and believes Crestor may be contributing.  Her most recent direct LDL was 162.  She states she has a follow-up with Dr. Karilyn Cota in the near future and will have him repeat lipid panel.  Hopefully her LDL will have improved.   Medication Adjustments/Labs and Tests Ordered: Current medicines are reviewed at length with the patient today.  Concerns regarding medicines are outlined above.   Disposition: Follow-up with Dr. Wyline Mood or APP 6 months Signed, Rennis Harding, NP 04/03/2021 12:27 PM    [facility] at [facility] [address] [place], [place], [place] [address] Phone: [phone]; Fax: [phone]

## 2021-04-03 ENCOUNTER — Ambulatory Visit (INDEPENDENT_AMBULATORY_CARE_PROVIDER_SITE_OTHER): Payer: Medicare Other | Admitting: Family Medicine

## 2021-04-03 ENCOUNTER — Encounter: Payer: Self-pay | Admitting: Family Medicine

## 2021-04-03 VITALS — BP 110/68 | HR 70 | Ht 64.0 in | Wt 132.8 lb

## 2021-04-03 DIAGNOSIS — I214 Non-ST elevation (NSTEMI) myocardial infarction: Secondary | ICD-10-CM

## 2021-04-03 DIAGNOSIS — I10 Essential (primary) hypertension: Secondary | ICD-10-CM

## 2021-04-03 DIAGNOSIS — E782 Mixed hyperlipidemia: Secondary | ICD-10-CM | POA: Diagnosis not present

## 2021-04-03 DIAGNOSIS — I5181 Takotsubo syndrome: Secondary | ICD-10-CM

## 2021-04-03 NOTE — Patient Instructions (Addendum)

## 2021-04-16 ENCOUNTER — Other Ambulatory Visit (INDEPENDENT_AMBULATORY_CARE_PROVIDER_SITE_OTHER): Payer: Self-pay | Admitting: Internal Medicine

## 2021-05-22 ENCOUNTER — Encounter (INDEPENDENT_AMBULATORY_CARE_PROVIDER_SITE_OTHER): Payer: Self-pay | Admitting: Nurse Practitioner

## 2021-05-22 ENCOUNTER — Other Ambulatory Visit (INDEPENDENT_AMBULATORY_CARE_PROVIDER_SITE_OTHER): Payer: Self-pay | Admitting: Nurse Practitioner

## 2021-05-22 ENCOUNTER — Other Ambulatory Visit: Payer: Self-pay

## 2021-05-22 ENCOUNTER — Ambulatory Visit (INDEPENDENT_AMBULATORY_CARE_PROVIDER_SITE_OTHER): Payer: Medicare Other | Admitting: Nurse Practitioner

## 2021-05-22 VITALS — BP 102/62 | HR 83 | Temp 96.7°F | Ht 64.0 in | Wt 133.2 lb

## 2021-05-22 DIAGNOSIS — E782 Mixed hyperlipidemia: Secondary | ICD-10-CM

## 2021-05-22 DIAGNOSIS — E559 Vitamin D deficiency, unspecified: Secondary | ICD-10-CM

## 2021-05-22 DIAGNOSIS — R12 Heartburn: Secondary | ICD-10-CM

## 2021-05-22 DIAGNOSIS — M25532 Pain in left wrist: Secondary | ICD-10-CM

## 2021-05-22 DIAGNOSIS — Z0000 Encounter for general adult medical examination without abnormal findings: Secondary | ICD-10-CM

## 2021-05-22 DIAGNOSIS — F419 Anxiety disorder, unspecified: Secondary | ICD-10-CM

## 2021-05-22 MED ORDER — MONTELUKAST SODIUM 10 MG PO TABS: 10.0000 mg | ORAL_TABLET | ORAL | 1 refills | Status: AC

## 2021-05-22 MED ORDER — ALPRAZOLAM 0.5 MG PO TABS: 0.5000 mg | ORAL_TABLET | Freq: Every evening | ORAL | 0 refills | Status: DC | PRN

## 2021-05-22 MED ORDER — OMEPRAZOLE 20 MG PO CPDR: 20.0000 mg | DELAYED_RELEASE_CAPSULE | Freq: Every day | ORAL | 3 refills | Status: DC

## 2021-05-22 NOTE — Progress Notes (Signed)
Subjective:  Patient ID: Sara Mcmahon, female    DOB: 12/28/1960  Age: 60 y.o. MRN: [id number]  CC:  Chief Complaint  Patient presents with   Follow-up    Doing okay, left hand having pain in left thumb and first finger and loosing her grip   Hyperlipidemia   Other    Vitamin D deficiency, heart burn, left thumb pain      HPI  This patient arrives today for the above.  Hyperlipidemia: She takes rosuvastatin but only a few times a week.  She is concerned it is causing heartburn.  She is due for lipid panel today.  Last LDL was 147 this was collected about a year ago.  She does have a history of coronary artery disease.  Vitamin D deficiency: She is on vitamin D3 supplement.  Last serum check showed a level of 57.  Heartburn: She reports intermittent heartburn she thinks is related to the rosuvastatin as stated above.  She will take as needed Tums or Pepto-Bismol which seems to help with the heartburn.  She was unable to quantify how often she has the heartburn but it sounds like it is pretty intermittent.  She feels that the heartburn that she gotten better since stopping the rosuvastatin.  Left thumb pain: She is been experiencing some left hand pain that is located mainly in the first digit and wraps around to her thumb and radiates up to her wrist.  Is been going on for about 6 to 12 months.  It is constant but the intensity of the pain waxes and wanes.  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  It will sometimes wake her up out of sleep when she has the hand exposed to cold air as well.  She does have a history of vitiligo and has a history of rheumatoid arthritis in her family.  She tells me she is concerned she may be experiencing rheumatoid arthritis.  She denies any other obvious joint pain or swelling elsewhere in the body.  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   She tells me that she is on disability secondary to her cardiomyopathy.   Social History   Tobacco Use   Smoking status: Never   Smokeless tobacco: Never  Substance Use Topics   Alcohol use: Never    Alcohol/week: 0.0 standard drinks     Current Meds  Medication Sig   ALPRAZolam (XANAX) 0.5 MG tablet Take 1 tablet (0.5 mg total) by mouth at bedtime. TAKE 1 TABLET(0.5 MG) BY MOUTH AT BEDTIME AS NEEDED FOR ANXIETY   aspirin EC 81 MG EC tablet Take 1 tablet (81 mg total) by mouth daily. Swallow whole.   carvedilol (COREG) 3.125 MG tablet Take 1 tablet (3.125 mg total) by mouth 2 (two) times daily with a  meal.   cholecalciferol (VITAMIN D) 1000 units tablet Take 5,000 Units by mouth daily.   fluticasone (FLONASE) 50 MCG/ACT nasal spray SHAKE LIQUID AND USE 1 SPRAY IN EACH NOSTRIL DAILY   MAGNESIUM ASPARTATE PO Take 1 each by mouth daily.   montelukast (SINGULAIR) 10 MG tablet TAKE 1 TABLET(10 MG) BY MOUTH AT BEDTIME (Patient taking differently: Take 10 mg by mouth every other day.)   nitroGLYCERIN (NITROSTAT) 0.4 MG SL tablet Place 1 tablet (0.4 mg total) under the tongue every 5 (five) minutes as needed for chest pain.   NON FORMULARY Plexus   omeprazole (PRILOSEC) 20 MG capsule Take 1 capsule (20 mg total) by mouth daily.   potassium chloride (MICRO-K) 10 MEQ CR capsule TAKE 1 CAPSULE(10 MEQ) BY MOUTH DAILY   potassium chloride (MICRO-K) 10 MEQ CR capsule TAKE 1 CAPSULE(10 MEQ) BY MOUTH DAILY   rosuvastatin  (CRESTOR) 20 MG tablet Take 20 mg by mouth daily.   Turmeric (QC TUMERIC COMPLEX PO) Take 1 tablet by mouth daily.   [DISCONTINUED] rosuvastatin (CRESTOR) 20 MG tablet Take 1 tablet (20 mg total) by mouth daily.    ROS:  Review of Systems  Respiratory:  Negative for shortness of breath.   Cardiovascular:  Negative for chest pain.  Gastrointestinal:  Positive for heartburn.  Musculoskeletal:  Positive for joint pain.    Objective:   Today's Vitals: BP 102/62   Pulse 83   Temp (!) 96.7 F (35.9 C) (Temporal)   Ht _0  (1.626 m)   Wt 133 lb 3.2 oz (60.4 kg)   SpO2 97%   BMI 22.86 kg/m  Vitals with BMI 05/22/2021 04/03/2021 03/31/2021  Height _1  _2  -  Weight 133 lbs 3 oz 132 lbs 13 oz -  BMI 20.25 42.70 -  Systolic [phone]  Diastolic 62 68 65  Pulse 83 70 103     Physical Exam Vitals reviewed.  Constitutional:      General: She is not in acute distress.    Appearance: Normal appearance.  HENT:     Head: Normocephalic and atraumatic.  Neck:     Vascular: No carotid bruit.  Cardiovascular:     Rate and Rhythm: Normal rate and regular rhythm.     Pulses: Normal pulses.     Heart sounds: Normal heart sounds.  Pulmonary:     Effort: Pulmonary effort is normal.     Breath sounds: Normal breath sounds.  Musculoskeletal:     Left wrist: Swelling present. No deformity, effusion or bony tenderness.     Left hand: Swelling present. No deformity or bony tenderness. Normal range of motion.  Skin:    General: Skin is warm and dry.  Neurological:     General: No focal deficit present.     Mental Status: She is alert and oriented to person, place, and time.  Psychiatric:        Mood and Affect: Mood normal.        Behavior: Behavior normal.        Judgment: Judgment normal.         Assessment and Plan   1. Left wrist pain   2. Vitamin D deficiency disease   3. Mixed hyperlipidemia   4. Heartburn      Plan: 1.  I thought it is unlikely she has  rheumatoid arthritis, however we will collect some blood work and get x-ray of the wrist for further evaluation.  Further recommendations may be made based  upon these results. 2.  We will check serum vitamin D level and CMP today. 3., 4.  We will check lipid panel and CMP today.  I encouraged her to consider trying take rosuvastatin a few times a week as I believe some statin will give her protection against risk of heart attack or stroke as opposed to no statin at all.  We will also trial omeprazole to see if this helps with the heartburn.   Tests ordered Orders Placed This Encounter  Procedures   DG Wrist Complete Left   Rheumatoid Factor   Cyclic citrul peptide antibody, IgG   ANA   CMP with eGFR([facility])   Lipid Panel   Vitamin D, 25-hydroxy      Meds ordered this encounter  Medications   omeprazole (PRILOSEC) 20 MG capsule    Sig: Take 1 capsule (20 mg total) by mouth daily.    Dispense:  30 capsule    Refill:  3    Order Specific Question:   Supervising Provider    Answer:   Doree Albee [[id number]]    Patient to follow-up in 3 months or sooner as needed.  Ailene Ards, NP

## 2021-05-23 LAB — LIPID PANEL
Cholesterol: 151 mg/dL (ref <200)
HDL: 49 mg/dL — ABNORMAL LOW (ref >=50)
LDL Cholesterol (Calc): 78 mg/dL (calc)
Non-HDL Cholesterol (Calc): 102 mg/dL (calc) (ref <130)
Total CHOL/HDL Ratio: 3.1 (calc) (ref <5.0)
Triglycerides: 137 mg/dL (ref <150)

## 2021-05-23 LAB — COMPLETE METABOLIC PANEL WITH GFR
AG Ratio: 1.9 (calc) (ref 1.0–2.5)
ALT: 13 U/L (ref 6–29)
AST: 20 U/L (ref 10–35)
Albumin: 4.1 g/dL (ref 3.6–5.1)
Alkaline phosphatase (APISO): 52 U/L (ref 37–153)
BUN: 17 mg/dL (ref 7–25)
CO2: 27 mmol/L (ref 20–32)
Calcium: 9.7 mg/dL (ref 8.6–10.4)
Chloride: 106 mmol/L (ref 98–110)
Creat: 0.83 mg/dL (ref 0.50–0.99)
GFR, Est African American: 88 mL/min/{1.73_m2} (ref >=60)
GFR, Est Non African American: 76 mL/min/{1.73_m2} (ref >=60)
Globulin: 2.2 g/dL (calc) (ref 1.9–3.7)
Glucose, Bld: 90 mg/dL (ref 65–139)
Potassium: 4.3 mmol/L (ref 3.5–5.3)
Sodium: 140 mmol/L (ref 135–146)
Total Bilirubin: 0.3 mg/dL (ref 0.2–1.2)
Total Protein: 6.3 g/dL (ref 6.1–8.1)

## 2021-05-23 LAB — RHEUMATOID FACTOR: Rheumatoid fact SerPl-aCnc: <14 IU/mL (ref <14)

## 2021-05-23 LAB — VITAMIN D 25 HYDROXY (VIT D DEFICIENCY, FRACTURES): Vit D, 25-Hydroxy: 62 ng/mL (ref 30–100)

## 2021-05-23 LAB — ANA: Anti Nuclear Antibody (ANA): NEGATIVE

## 2021-05-23 LAB — CYCLIC CITRUL PEPTIDE ANTIBODY, IGG: Cyclic Citrullin Peptide Ab: <16 UNITS

## 2021-05-27 ENCOUNTER — Other Ambulatory Visit (INDEPENDENT_AMBULATORY_CARE_PROVIDER_SITE_OTHER): Payer: Self-pay | Admitting: Internal Medicine

## 2021-05-27 ENCOUNTER — Other Ambulatory Visit (INDEPENDENT_AMBULATORY_CARE_PROVIDER_SITE_OTHER): Payer: Self-pay

## 2021-05-27 DIAGNOSIS — F419 Anxiety disorder, unspecified: Secondary | ICD-10-CM

## 2021-05-27 MED ORDER — ALPRAZOLAM 0.5 MG PO TABS: 0.5000 mg | ORAL_TABLET | Freq: Every evening | ORAL | 2 refills | Status: DC | PRN

## 2021-05-27 MED ORDER — ALPRAZOLAM 0.5 MG PO TABS: 0.5000 mg | ORAL_TABLET | Freq: Every evening | ORAL | 2 refills | Status: AC | PRN

## 2021-08-12 ENCOUNTER — Telehealth: Payer: Self-pay | Admitting: Family Medicine

## 2021-08-12 NOTE — Telephone Encounter (Signed)
New message     *STAT* If patient is at the pharmacy, call can be transferred to refill team.   1. Which medications need to be refilled? (please list name of each medication and dose if known) potassium chloride (MICRO-K) 10 MEQ CR capsule  2. Which pharmacy/location (including street and city if local pharmacy) is medication to be sent to? [facility] on [address]   3. Do they need a 30 day or 90 day supply? 90

## 2021-08-13 MED ORDER — POTASSIUM CHLORIDE ER 10 MEQ PO CPCR: ORAL_CAPSULE | ORAL | 1 refills | Status: DC

## 2021-08-14 IMAGING — DX DG CHEST 1V PORT
1 series · 1 of 1 positions shown · non-contrast
Comparison: August 02, 2020

CLINICAL DATA: Chest pain.

EXAM:
PORTABLE CHEST 1 VIEW

[chest ap]
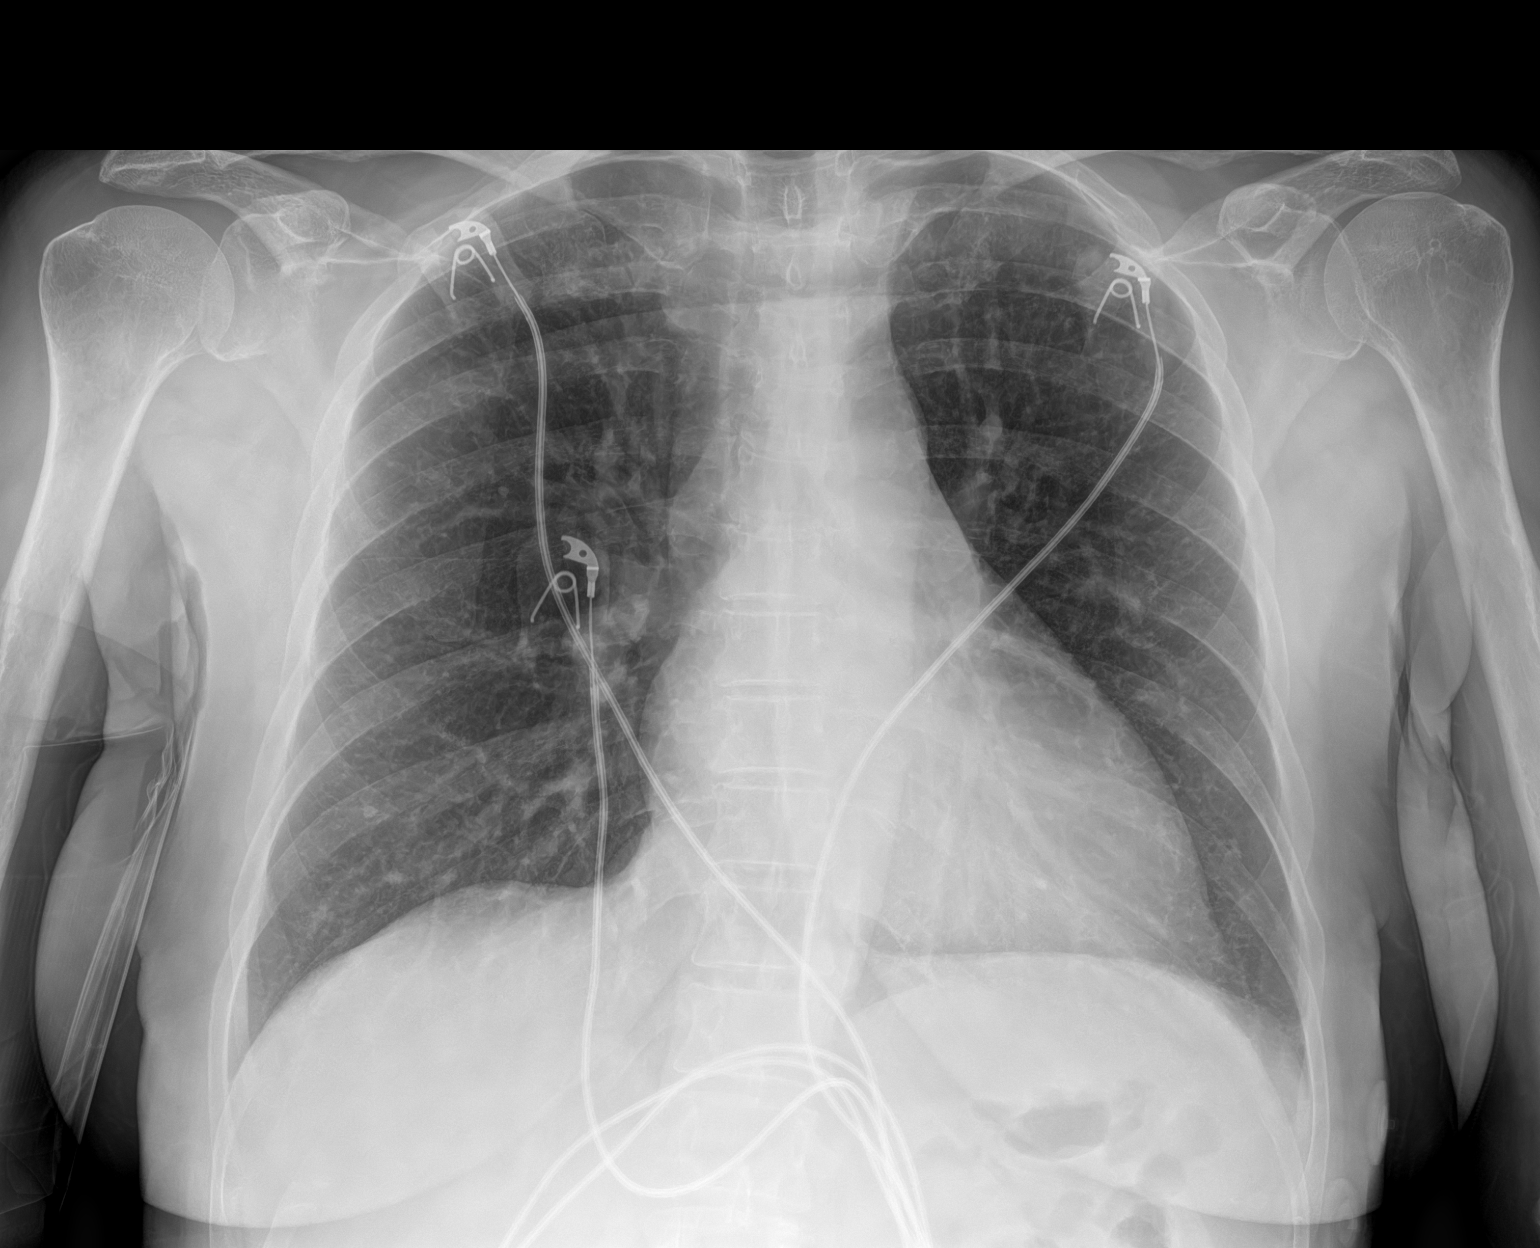

[1 of 1 positions shown; findings below may reference images not displayed]

FINDINGS: Cardiomediastinal silhouette is normal. Mediastinal contours appear
intact.

There is no evidence of focal airspace consolidation, pleural
effusion or pneumothorax.

Osseous structures are without acute abnormality. Soft tissues are
grossly normal.
IMPRESSION: No active disease.

## 2021-08-25 ENCOUNTER — Ambulatory Visit (INDEPENDENT_AMBULATORY_CARE_PROVIDER_SITE_OTHER): Payer: Medicare Other | Admitting: Internal Medicine

## 2021-10-05 NOTE — Progress Notes (Signed)
Cardiology Office Note  Date: 10/06/2021   ID: Sara Mcmahon, DOB [birth year], MRN [id number]  PCP:  Sara Nevins, MD  Cardiologist:  Sara Rich, MD Electrophysiologist:  None   Chief Complaint: 24-month follow-up  History of Present Illness: Sara Mcmahon is a 60 y.o. female with a history of NSTEMI, Takatsubo cardiomyopathy, CHF, GERD, HTN, HLD, anxiety.  Previously seen by Dr. Purvis Sheffield 06/15/2019 for history of Takotsubo cardiomyopathy, PSVT, hyperlipidemia, hypertension.  She occasionally had some exertional fatigue.  History of anxiety taking Xanax at night to help her sleep.  She denied any chest pain or palpitations.  She experienced occasional dizziness with approximately once a week.  Previous occurrence of Takotsubo's cardiomyopathy in 2016 had resolved and she was symptomatically stable.  Dr. Purvis Sheffield weaned her off carvedilol for complaints of dizziness.  She was continuing her Crestor for hyperlipidemia.  Hypertension was controlled on low-dose HCTZ.  She presented to [facility] on 01/12/2021 with chest pain.  She was transferred to [facility] for management of suspected NSTEMI.  An echocardiogram revealed LV function of 25 to 30% with G1 DD and Takotsubo type wall motion abnormality.  Cardiac catheterization mild nonobstructive CAD and catheter induced coronary vasospasm of proximal RCA relieved by intracoronary nitroglycerin.  She was to continue aspirin 81 mg and Crestor.  She was discharged on carvedilol 3.125 mg p.o. twice daily.  Crestor was started during admission due to LDL of 147.   She is here for follow-up on her history of Takotsubo's cardiomyopathy 02-10-21.  She states her mother died recently but she has had no symptoms during that time.  She denies any anginal or exertional symptoms, orthostatic symptoms, CVA or TIA-like symptoms, PND, orthopnea.  She states she did have some mild symptoms on particular day which she attributed to  GERD.  She states she has stopped her omeprazole and Crestor due to acid reflux symptoms.  She is taking a naturopathic medication she describes as a substance called Plex  She states since starting this she has had no reflux symptoms.  Her follow-up echocardiogram on 03/26/2021 showed significant improvement in EF.  EF 60 to 65%.  No WMA's.  G1 DD.  Mild MR.    Past Medical History:  Diagnosis Date   Cardiomyopathy [facility])    CHF (congestive heart failure) (HCC)    Fractures    GERD (gastroesophageal reflux disease)    Hepatitis    HLD (hyperlipidemia) 10/19/2019   Hypercholesteremia    Insomnia    Takotsubo cardiomyopathy 10/19/2019   Vitiligo     Past Surgical History:  Procedure Laterality Date   CARDIAC CATHETERIZATION     CARDIAC CATHETERIZATION     CESAREAN SECTION     LEFT HEART CATH AND CORONARY ANGIOGRAPHY N/A 01/13/2021   Procedure: LEFT HEART CATH AND CORONARY ANGIOGRAPHY;  Surgeon: Yvonne Kendall, MD;  Location: [facility] INVASIVE CV LAB;  Service: Cardiovascular;  Laterality: N/A;    Current Outpatient Medications  Medication Sig Dispense Refill   ALPRAZolam (XANAX) 0.5 MG tablet Take 1 tablet (0.5 mg total) by mouth at bedtime as needed for anxiety. 30 tablet 2   aspirin EC 81 MG EC tablet Take 1 tablet (81 mg total) by mouth daily. Swallow whole. 30 tablet 11   carvedilol (COREG) 3.125 MG tablet Take 1 tablet (3.125 mg total) by mouth 2 (two) times daily with a meal. 180 tablet 2   cholecalciferol (VITAMIN D) 1000 units tablet Take 5,000 Units by mouth  daily.     fluticasone (FLONASE) 50 MCG/ACT nasal spray SHAKE LIQUID AND USE 1 SPRAY IN EACH NOSTRIL DAILY     montelukast (SINGULAIR) 10 MG tablet Take 1 tablet (10 mg total) by mouth every other day. 45 tablet 1   naproxen (NAPROSYN) 500 MG tablet Take 500 mg by mouth 2 (two) times daily as needed.     nitroGLYCERIN (NITROSTAT) 0.4 MG SL tablet Place 1 tablet (0.4 mg total) under the tongue every 5 (five) minutes as needed for  chest pain. 25 tablet 3   NON FORMULARY Plexus     potassium chloride (MICRO-K) 10 MEQ CR capsule TAKE 1 CAPSULE(10 MEQ) BY MOUTH DAILY 90 capsule 1   Turmeric (QC TUMERIC COMPLEX PO) Take 1 tablet by mouth daily.     No current facility-administered medications for this visit.   Allergies:  Pollen extract   Social History: The patient  reports that she has never smoked. She has never used smokeless tobacco. She reports that she does not drink alcohol and does not use drugs.   Family History: The patient's family history includes Cancer in her mother; Heart disease in her father; Hypertension in her father and mother.   ROS:  Please see the history of present illness. Otherwise, complete review of systems is positive for none.  All other systems are reviewed and negative.   Physical Exam: VS:  BP 118/78   Pulse 74   Ht 5\' 4"  (1.626 m)   Wt 134 lb 6.4 oz (61 kg)   SpO2 99%   BMI 23.07 kg/m , BMI Body mass index is 23.07 kg/m.  Wt Readings from Last 3 Encounters:  10/06/21 134 lb 6.4 oz (61 kg)  05/22/21 133 lb 3.2 oz (60.4 kg)  04/03/21 132 lb 12.8 oz (60.2 kg)    General: Patient appears comfortable at rest. Neck: Supple, no elevated JVP or carotid bruits, no thyromegaly. Lungs: Clear to auscultation, nonlabored breathing at rest. Cardiac: Regular rate and rhythm, no S3 or significant systolic murmur, no pericardial rub. Extremities: No pitting edema, distal pulses 2+. Skin: Warm and dry. Musculoskeletal: No kyphosis. Neuropsychiatric: Alert and oriented x3, affect grossly appropriate.  ECG:  EKG January 14, 2021 normal sinus rhythm rate of 67, T wave abnormality consider inferior ischemia, consider anterolateral ischemia.  Prolonged QT T compared with QT has lengthened, anterior lateral T wave inversions now present.  Recent Labwork: 01/12/2021: B Natriuretic Peptide 194.2 01/14/2021: Hemoglobin 11.1; Platelets 225 05/22/2021: ALT 13; AST 20; BUN 17; Creat 0.83; Potassium 4.3;  Sodium 140     Component Value Date/Time   CHOL 151 05/22/2021 1135   TRIG 137 05/22/2021 1135   HDL 49 (L) 05/22/2021 1135   CHOLHDL 3.1 05/22/2021 1135   LDLCALC 78 05/22/2021 1135   LDLDIRECT 162.0 (H) 01/12/2021 1600    Other Studies Reviewed Today:   Echocardiogram 03/26/2021  1. Left ventricular ejection fraction, by estimation, is 60 to 65%. The left ventricle has normal function. The left ventricle has no regional wall motion abnormalities. Left ventricular diastolic parameters are consistent with Grade I diastolic dysfunction (impaired relaxation). The average left ventricular global longitudinal strain is 19.2 %. The global longitudinal strain is normal. 2. Right ventricular systolic function is normal. The right ventricular size is normal. 3. Left atrial size was mildly dilated. 4. The mitral valve is normal in structure. Mild mitral valve regurgitation. No evidence of mitral stenosis. 5. The aortic valve is tricuspid. Aortic valve regurgitation is not visualized. No  aortic stenosis is present. 6. The inferior vena cava is normal in size with greater than 50% respiratory variability, suggesting right atrial pressure of 3 mmHg. Comparison(s): Echocardiogram done 01/13/21 showed an EF of 25-30%.   Cardiac catheterization 01/13/2021 Conclusions: Mild, non-obstructive coronary artery disease with 30% proximal LAD disease as well as catheter-induced vasospasm involving the proximal RCA that improved from 95% to 10-20% following administration of intracoronary nitroglycerin. Severely reduced left-ventricular contraction with wall motion abnormality consistent with Takotsubo variant. Normal left ventricular filling pressure.   Recommendations: Optimize goal directed medical therapy for acute systolic heart failure due to stress-induced cardiomyopathy. Medical therapy and risk factor modification to prevent progression of atherosclerotic coronary artery  disease. Diagnostic Dominance: Right     Echocardiogram 01/13/2021   1. Left ventricular ejection fraction, by estimation, is 25 to 30%. The left ventricle has severely decreased function. The left ventricle demonstrates regional wall motion abnormalities with severe hypokinesis to akinesis of the mid and apical LV segments, relative preservation of the basal segments and the apical cap. Need to rule out coronary disease, but stress (Takotsubo-type) cardiomyopathy is certainly possible. Left ventricular diastolic parameters are consistent with Grade I diastolic dysfunction (impaired relaxation). 2. Right ventricular systolic function is normal. The right ventricular size is normal. There is normal pulmonary artery systolic pressure. The estimated right ventricular systolic pressure is 20.0 mmHg. 3. The mitral valve is normal in structure. Trivial mitral valve regurgitation. No evidence of mitral stenosis. 4. The aortic valve is tricuspid. Aortic valve regurgitation is not visualized. No aortic stenosis is present. 5. The inferior vena cava is normal in size with greater than 50% respiratory variability, suggesting right atrial pressure of 3 mmHg.   Assessment and Plan:  1. Takotsubo cardiomyopathy   2. CAD in native artery   3. Essential hypertension, benign   4. Mixed hyperlipidemia     1. Takotsubo cardiomyopathy Diagnosis of of Takotsubo's cardiomyopathy in February 2022.  Cardiac catheterization revealed mild nonobstructive CAD with 30% proximal LAD as well as catheter induced vasospasm involving proximal RCA improved from 95% to 10 to 20% following administration of intracoronary nitroglycerin.  Severely reduced LV contraction with wall motion abnormality consistent with Takotsubo cardiomyopathy.  Recent follow-up echocardiogram on 03/26/2021 showed significant improvement in EF.  EF now 60 to 65%.  No WMA's.  G1 DD.  Mild MR.  Continue Coreg 3.125 mg p.o. twice daily.  Denies any  recent anginal or exertional symptoms.  Continue carvedilol 3.125 mg p.o. twice daily.  Please refill today.   2.  CAD/history of NSTEMI No anginal or exertional symptoms.  Continue aspirin 81 mg daily.  Continue nitroglycerin sublingual as needed.  Continue carvedilol 3.125 mg p.o. twice daily.  3. Essential hypertension, benign Blood pressure well controlled today with BP 118/78 continue carvedilol 3.125 mg p.o. twice daily.  4. Mixed hyperlipidemia She states she stopped her Crestor due to significant reflux symptoms.  Recent lipid panel on 05/22/2021 : TC 151, HDL 49, LDL 78, triglycerides 137.  Medication Adjustments/Labs and Tests Ordered: Current medicines are reviewed at length with the patient today.  Concerns regarding medicines are outlined above.   Disposition: Follow-up with Dr. Wyline Mood or APP 6 months Signed, Rennis Harding, NP 10/06/2021 11:23 AM    [facility] at [facility] [address] [place], [place], [place] [address] Phone: [phone]; Fax: [phone]

## 2021-10-06 ENCOUNTER — Ambulatory Visit: Payer: Medicare Other | Admitting: Family Medicine

## 2021-10-06 ENCOUNTER — Other Ambulatory Visit: Payer: Self-pay

## 2021-10-06 ENCOUNTER — Encounter: Payer: Self-pay | Admitting: Family Medicine

## 2021-10-06 VITALS — BP 118/78 | HR 74 | Ht 64.0 in | Wt 134.4 lb

## 2021-10-06 DIAGNOSIS — I10 Essential (primary) hypertension: Secondary | ICD-10-CM

## 2021-10-06 DIAGNOSIS — E782 Mixed hyperlipidemia: Secondary | ICD-10-CM

## 2021-10-06 DIAGNOSIS — I5181 Takotsubo syndrome: Secondary | ICD-10-CM | POA: Diagnosis not present

## 2021-10-06 DIAGNOSIS — I2510 Atherosclerotic heart disease of native coronary artery without angina pectoris: Secondary | ICD-10-CM

## 2021-10-06 MED ORDER — CARVEDILOL 3.125 MG PO TABS: 3.1250 mg | ORAL_TABLET | Freq: Two times a day (BID) | ORAL | 2 refills | Status: DC

## 2021-10-06 NOTE — Patient Instructions (Signed)

## 2022-01-08 DIAGNOSIS — G4700 Insomnia, unspecified: Secondary | ICD-10-CM | POA: Diagnosis not present

## 2022-01-08 DIAGNOSIS — R109 Unspecified abdominal pain: Secondary | ICD-10-CM | POA: Diagnosis not present

## 2022-01-08 DIAGNOSIS — Z6824 Body mass index (BMI) 24.0-24.9, adult: Secondary | ICD-10-CM | POA: Diagnosis not present

## 2022-03-11 ENCOUNTER — Other Ambulatory Visit (INDEPENDENT_AMBULATORY_CARE_PROVIDER_SITE_OTHER): Payer: Self-pay | Admitting: Nurse Practitioner

## 2022-03-11 DIAGNOSIS — R12 Heartburn: Secondary | ICD-10-CM

## 2022-05-19 DIAGNOSIS — E559 Vitamin D deficiency, unspecified: Secondary | ICD-10-CM | POA: Diagnosis not present

## 2022-05-19 DIAGNOSIS — M779 Enthesopathy, unspecified: Secondary | ICD-10-CM | POA: Diagnosis not present

## 2022-05-19 DIAGNOSIS — K5900 Constipation, unspecified: Secondary | ICD-10-CM | POA: Diagnosis not present

## 2022-05-19 DIAGNOSIS — Z6824 Body mass index (BMI) 24.0-24.9, adult: Secondary | ICD-10-CM | POA: Diagnosis not present

## 2022-05-19 DIAGNOSIS — Z0001 Encounter for general adult medical examination with abnormal findings: Secondary | ICD-10-CM | POA: Diagnosis not present

## 2022-05-19 DIAGNOSIS — E782 Mixed hyperlipidemia: Secondary | ICD-10-CM | POA: Diagnosis not present

## 2022-05-19 DIAGNOSIS — I10 Essential (primary) hypertension: Secondary | ICD-10-CM | POA: Diagnosis not present

## 2022-05-19 DIAGNOSIS — R5383 Other fatigue: Secondary | ICD-10-CM | POA: Diagnosis not present

## 2022-06-10 DIAGNOSIS — Z1211 Encounter for screening for malignant neoplasm of colon: Secondary | ICD-10-CM | POA: Diagnosis not present

## 2022-06-10 DIAGNOSIS — Z1212 Encounter for screening for malignant neoplasm of rectum: Secondary | ICD-10-CM | POA: Diagnosis not present

## 2022-07-08 ENCOUNTER — Encounter: Payer: Self-pay | Admitting: Cardiology

## 2022-07-08 ENCOUNTER — Ambulatory Visit: Payer: Medicare Other | Admitting: Cardiology

## 2022-07-08 VITALS — BP 148/80 | HR 62 | Ht 63.0 in | Wt 137.5 lb

## 2022-07-08 DIAGNOSIS — I2510 Atherosclerotic heart disease of native coronary artery without angina pectoris: Secondary | ICD-10-CM | POA: Diagnosis not present

## 2022-07-08 DIAGNOSIS — E782 Mixed hyperlipidemia: Secondary | ICD-10-CM | POA: Diagnosis not present

## 2022-07-08 DIAGNOSIS — I5181 Takotsubo syndrome: Secondary | ICD-10-CM

## 2022-07-08 DIAGNOSIS — I10 Essential (primary) hypertension: Secondary | ICD-10-CM | POA: Diagnosis not present

## 2022-07-08 MED ORDER — NITROGLYCERIN 0.4 MG SL SUBL: 0.4000 mg | SUBLINGUAL_TABLET | SUBLINGUAL | 3 refills | Status: AC | PRN

## 2022-07-08 MED ORDER — POTASSIUM CHLORIDE ER 10 MEQ PO CPCR: ORAL_CAPSULE | ORAL | 3 refills | Status: DC

## 2022-07-08 MED ORDER — CARVEDILOL 3.125 MG PO TABS: 3.1250 mg | ORAL_TABLET | Freq: Two times a day (BID) | ORAL | 3 refills | Status: DC

## 2022-07-08 NOTE — Patient Instructions (Signed)
Medication Instructions:  The current medical regimen is effective;  continue present plan and medications.  *If you need a refill on your cardiac medications before your next appointment, please call your pharmacy*  Follow-Up: At [facility], you and your health needs are our priority.  As part of our continuing mission to provide you with exceptional heart care, we have created designated Provider Care Teams.  These Care Teams include your primary Cardiologist (physician) and Advanced Practice Providers (APPs -  Physician Assistants and Nurse Practitioners) who all work together to provide you with the care you need, when you need it.  We recommend signing up for the patient portal called "MyChart".  Sign up information is provided on this After Visit Summary.  MyChart is used to connect with patients for Virtual Visits (Telemedicine).  Patients are able to view lab/test results, encounter notes, upcoming appointments, etc.  Non-urgent messages can be sent to your provider as well.   To learn more about what you can do with MyChart, go to [email or web address].    Your next appointment:   1 year(s)  The format for your next appointment:   In Person  Provider:   You may see Dina Rich, MD or one of the following Advanced Practice Providers on your designated Care Team:   Randall An, PA-C  Jacolyn Reedy, [place]    Important Information About Sugar

## 2022-07-08 NOTE — Progress Notes (Signed)
Cardiology Office Note:    Date:  07/08/2022   ID:  Sara Mcmahon, DOB Jun 05, 1961, MRN [id number]  PCP:  Redmond School, MD   [facility] Providers Cardiologist:  Carlyle Dolly, MD     Referring MD: Redmond School, MD    History of Present Illness:    Sara Mcmahon is a 61 y.o. female here for the follow-up of prior non-STEMI, Takotsubo cardiomyopathy hypertension hyperlipidemia.  Back in 2020 had Takotsubo cardiomyopathy, PSVT hyperlipidemia hypertension.  Had some exertional fatigue.  Xanax for anxiety.  Occasional dizziness.  Previous occurrence of Takotsubo was 2016 which resolved.  She had previously been weaned down carvedilol because of complaints of dizziness.  Crestor was continued for hyperlipidemia and low-dose hydrochlorothiazide help to control hypertension.  Back in February 2022 (I saw her then) she had chest pain and was transferred to [facility] with suspected non-STEMI.  Her echo at that time revealed EF of 25 to 30% with Takotsubo type wall motion abnormality again.  Cardiac catheterization showed mild nonobstructive CAD.  She did have some catheter induced vasospasm of the proximal RCA relieved by intracoronary nitroglycerin.  Crestor was once again started LDL of 147.  Overall has been doing quite well.  Did go through a period of time where her mother died but she did not have any cardiac symptoms.  Follow-up echocardiogram on 03/26/2021 showed EF of 60 to 65%.  GERD. Does not like omeprazole. Bloating. Takes Plexis month 170$  Past Medical History:  Diagnosis Date   Cardiomyopathy ([place])    CHF (congestive heart failure) ([place])    Fractures    GERD (gastroesophageal reflux disease)    Hepatitis    HLD (hyperlipidemia) 10/19/2019   Hypercholesteremia    Insomnia    Takotsubo cardiomyopathy 10/19/2019   Vitiligo     Past Surgical History:  Procedure Laterality Date   CARDIAC CATHETERIZATION     CARDIAC CATHETERIZATION     CESAREAN  SECTION     LEFT HEART CATH AND CORONARY ANGIOGRAPHY N/A 01/13/2021   Procedure: LEFT HEART CATH AND CORONARY ANGIOGRAPHY;  Surgeon: Nelva Bush, MD;  Location: [place] CV LAB;  Service: Cardiovascular;  Laterality: N/A;    Current Medications: Current Meds  Medication Sig   ALPRAZolam (XANAX) 0.5 MG tablet Take 1 tablet (0.5 mg total) by mouth at bedtime as needed for anxiety.   ASHWAGANDHA PO Take by mouth.   aspirin EC 81 MG EC tablet Take 1 tablet (81 mg total) by mouth daily. Swallow whole.   cholecalciferol (VITAMIN D) 1000 units tablet Take 5,000 Units by mouth daily.   fluticasone (FLONASE) 50 MCG/ACT nasal spray SHAKE LIQUID AND USE 1 SPRAY IN EACH NOSTRIL DAILY   montelukast (SINGULAIR) 10 MG tablet Take 1 tablet (10 mg total) by mouth every other day.   NON FORMULARY Plexus   Turmeric (QC TUMERIC COMPLEX PO) Take 1 tablet by mouth daily.   [DISCONTINUED] carvedilol (COREG) 3.125 MG tablet Take 1 tablet (3.125 mg total) by mouth 2 (two) times daily with a meal.   [DISCONTINUED] nitroGLYCERIN (NITROSTAT) 0.4 MG SL tablet Place 1 tablet (0.4 mg total) under the tongue every 5 (five) minutes as needed for chest pain.   [DISCONTINUED] potassium chloride (MICRO-K) 10 MEQ CR capsule TAKE 1 CAPSULE(10 MEQ) BY MOUTH DAILY     Allergies:   Omeprazole and Pollen extract   Social History   Socioeconomic History   Marital status: Widowed    Spouse name: Not on file  Number of children: Not on file   Years of education: Not on file   Highest education level: Not on file  Occupational History   Occupation: parttime    Comment: sitter  Tobacco Use   Smoking status: Never   Smokeless tobacco: Never  Vaping Use   Vaping Use: Never used  Substance and Sexual Activity   Alcohol use: Never    Alcohol/week: 0.0 standard drinks of alcohol   Drug use: No   Sexual activity: Not on file  Other Topics Concern   Not on file  Social History Narrative   Divorced twice.Lives  alone.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   She tells me that she is on disability secondary to her cardiomyopathy.   Social Determinants of Health   Financial Resource Strain: Not on file  Food Insecurity: Not on file  Transportation Needs: Not on file  Physical Activity: Not on file  Stress: Not on file  Social Connections: Not on file     Family History: The patient's family history includes Cancer in her mother; Heart disease in her father; Hypertension in her father and mother.  ROS:   Please see the history of present illness.     All other systems reviewed and are negative.  EKGs/Labs/Other Studies Reviewed:    The following studies were reviewed today: ECHO CATH reviewed as below  ECHO 03/2021:  1. Left ventricular ejection fraction, by estimation, is 60 to 65%. The  left ventricle has normal function. The left ventricle has no regional  wall motion abnormalities. Left ventricular diastolic parameters are  consistent with Grade I diastolic  dysfunction (impaired relaxation). The average left ventricular global  longitudinal strain is 19.2 %. The global longitudinal strain is normal.   2. Right ventricular systolic function is normal. The right ventricular  size is normal.   3. Left atrial size was mildly dilated.   4. The mitral valve is normal in structure. Mild mitral valve  regurgitation. No evidence of mitral stenosis.   5. The aortic valve is tricuspid. Aortic valve regurgitation is not  visualized. No aortic stenosis is present.   6. The inferior vena cava is normal in size with greater than 50%  respiratory variability, suggesting right atrial pressure of 3 mmHg.   Comparison(s): Echocardiogram done 01/13/21 showed an EF of 25-30%.   EKG:  EKG is  ordered today.  The ekg ordered today demonstrates NSR 62.  Recent Labs: No results found for requested labs within last 365 days.  Recent Lipid Panel    Component Value Date/Time   CHOL 151 05/22/2021 1135   TRIG 137  05/22/2021 1135   HDL 49 (L) 05/22/2021 1135   CHOLHDL 3.1 05/22/2021 1135   LDLCALC 78 05/22/2021 1135   LDLDIRECT 162.0 (H) 01/12/2021 1600     Risk Assessment/Calculations:              Physical Exam:    VS:  BP (!) 148/80   Pulse 62   Ht 5\' 3"  (1.6 m)   Wt 137 lb 8 oz (62.4 kg)   SpO2 98%   BMI 24.36 kg/m     Wt Readings from Last 3 Encounters:  07/08/22 137 lb 8 oz (62.4 kg)  10/06/21 134 lb 6.4 oz (61 kg)  05/22/21 133 lb 3.2 oz (60.4 kg)     GEN:  Well nourished, well developed in no acute distress HEENT: Normal NECK: No JVD; No carotid bruits LYMPHATICS: No lymphadenopathy CARDIAC: RRR, no murmurs, no rubs,  gallops RESPIRATORY:  Clear to auscultation without rales, wheezing or rhonchi  ABDOMEN: Soft, non-tender, non-distended MUSCULOSKELETAL:  No edema; No deformity  SKIN: Warm and dry NEUROLOGIC:  Alert and oriented x 3 PSYCHIATRIC:  Normal affect   ASSESSMENT:    1. Takotsubo cardiomyopathy   2. CAD in native artery   3. Essential hypertension, benign   4. Mixed hyperlipidemia    PLAN:    In order of problems listed above:  Takotsubo cardiomyopathy - 2016, 2022.  Nonobstructive CAD on catheterization.  EF went from 25% up to 65%.  Normal.  Carvedilol 3.125 mg twice a day was continued.  No recent symptoms.  Excellent.  Nonobstructive coronary artery disease - Cardiac catheterization revealed nonobstructive CAD.  Minimal plaque.  Continue with lipid management.  Hyperlipidemia - Unable to tolerate the Crestor due to significant reflux.  LDL 78 without.  Hypertension - Continue with low-dose carvedilol.       Medication Adjustments/Labs and Tests Ordered: Current medicines are reviewed at length with the patient today.  Concerns regarding medicines are outlined above.  Orders Placed This Encounter  Procedures   EKG 12-Lead   Meds ordered this encounter  Medications   nitroGLYCERIN (NITROSTAT) 0.4 MG SL tablet    Sig: Place 1  tablet (0.4 mg total) under the tongue every 5 (five) minutes as needed for chest pain.    Dispense:  25 tablet    Refill:  3   carvedilol (COREG) 3.125 MG tablet    Sig: Take 1 tablet (3.125 mg total) by mouth 2 (two) times daily with a meal.    Dispense:  180 tablet    Refill:  3   potassium chloride (MICRO-K) 10 MEQ CR capsule    Sig: TAKE 1 CAPSULE(10 MEQ) BY MOUTH DAILY    Dispense:  90 capsule    Refill:  3    Patient Instructions  Medication Instructions:  The current medical regimen is effective;  continue present plan and medications.  *If you need a refill on your cardiac medications before your next appointment, please call your pharmacy*  Follow-Up: At [facility], you and your health needs are our priority.  As part of our continuing mission to provide you with exceptional heart care, we have created designated Provider Care Teams.  These Care Teams include your primary Cardiologist (physician) and Advanced Practice Providers (APPs -  Physician Assistants and Nurse Practitioners) who all work together to provide you with the care you need, when you need it.  We recommend signing up for the patient portal called "MyChart".  Sign up information is provided on this After Visit Summary.  MyChart is used to connect with patients for Virtual Visits (Telemedicine).  Patients are able to view lab/test results, encounter notes, upcoming appointments, etc.  Non-urgent messages can be sent to your provider as well.   To learn more about what you can do with MyChart, go to [email or web address].    Your next appointment:   1 year(s)  The format for your next appointment:   In Person  Provider:   You may see Dina Rich, MD or one of the following Advanced Practice Providers on your designated Care Team:   Randall An, PA-C  Jacolyn Reedy, [place]    Important Information About Sugar         Signed, Donato Schultz, MD  07/08/2022 11:54 AM    [facility]

## 2022-11-16 DIAGNOSIS — I10 Essential (primary) hypertension: Secondary | ICD-10-CM | POA: Diagnosis not present

## 2022-11-16 DIAGNOSIS — R12 Heartburn: Secondary | ICD-10-CM | POA: Diagnosis not present

## 2022-12-29 DIAGNOSIS — Z7982 Long term (current) use of aspirin: Secondary | ICD-10-CM | POA: Diagnosis not present

## 2022-12-29 DIAGNOSIS — H81399 Other peripheral vertigo, unspecified ear: Secondary | ICD-10-CM | POA: Insufficient documentation

## 2022-12-29 DIAGNOSIS — R739 Hyperglycemia, unspecified: Secondary | ICD-10-CM | POA: Diagnosis not present

## 2022-12-29 DIAGNOSIS — R7309 Other abnormal glucose: Secondary | ICD-10-CM | POA: Diagnosis not present

## 2022-12-29 DIAGNOSIS — R42 Dizziness and giddiness: Secondary | ICD-10-CM | POA: Diagnosis present

## 2022-12-30 ENCOUNTER — Encounter (HOSPITAL_COMMUNITY): Payer: Self-pay | Admitting: Emergency Medicine

## 2022-12-30 ENCOUNTER — Emergency Department (HOSPITAL_COMMUNITY)
Admission: EM | Admit: 2022-12-30 | Discharge: 2022-12-30 | Disposition: A | Discharge status: Home / Self Care | Payer: Medicare Other | Attending: Emergency Medicine | Admitting: Emergency Medicine

## 2022-12-30 ENCOUNTER — Other Ambulatory Visit: Payer: Self-pay

## 2022-12-30 DIAGNOSIS — H81399 Other peripheral vertigo, unspecified ear: Secondary | ICD-10-CM | POA: Diagnosis not present

## 2022-12-30 DIAGNOSIS — R7309 Other abnormal glucose: Secondary | ICD-10-CM

## 2022-12-30 LAB — CBC WITH DIFFERENTIAL/PLATELET
Abs Immature Granulocytes: 0.03 10*3/uL (ref 0.00–0.07)
Basophils Absolute: 0.1 10*3/uL (ref 0.0–0.1)
Basophils Relative: 1 %
Eosinophils Absolute: 0.3 10*3/uL (ref 0.0–0.5)
Eosinophils Relative: 3 %
HCT: 36.4 % (ref 36.0–46.0)
Hemoglobin: 12.1 g/dL (ref 12.0–15.0)
Immature Granulocytes: 0 %
Lymphocytes Relative: 23 %
Lymphs Abs: 1.9 10*3/uL (ref 0.7–4.0)
MCH: 31.3 pg (ref 26.0–34.0)
MCHC: 33.2 g/dL (ref 30.0–36.0)
MCV: 94.3 fL (ref 80.0–100.0)
Monocytes Absolute: 1.1 10*3/uL — ABNORMAL HIGH (ref 0.1–1.0)
Monocytes Relative: 13 %
Neutro Abs: 5.1 10*3/uL (ref 1.7–7.7)
Neutrophils Relative %: 60 %
Platelets: 238 10*3/uL (ref 150–400)
RBC: 3.86 MIL/uL — ABNORMAL LOW (ref 3.87–5.11)
RDW: 13.2 % (ref 11.5–15.5)
WBC: 8.5 10*3/uL (ref 4.0–10.5)
nRBC: 0 % (ref 0.0–0.2)

## 2022-12-30 LAB — BASIC METABOLIC PANEL
Anion gap: 8 (ref 5–15)
BUN: 17 mg/dL (ref 8–23)
CO2: 27 mmol/L (ref 22–32)
Calcium: 9.4 mg/dL (ref 8.9–10.3)
Chloride: 103 mmol/L (ref 98–111)
Creatinine, Ser: 0.77 mg/dL (ref 0.44–1.00)
GFR, Estimated: >60 mL/min (ref >60)
Glucose, Bld: 105 mg/dL — ABNORMAL HIGH (ref 70–99)
Potassium: 3.6 mmol/L (ref 3.5–5.1)
Sodium: 138 mmol/L (ref 135–145)

## 2022-12-30 MED ORDER — MECLIZINE HCL 25 MG PO TABS: 25.0000 mg | ORAL_TABLET | Freq: Three times a day (TID) | ORAL | 0 refills | Status: DC | PRN

## 2022-12-30 MED ORDER — ONDANSETRON HCL 4 MG/2ML IJ SOLN
4.0000 mg | Freq: Once | INTRAMUSCULAR | Status: AC
  Administered 2022-12-30: 4 mg via INTRAVENOUS
  Filled 2022-12-30: qty 2

## 2022-12-30 MED ORDER — MECLIZINE HCL 12.5 MG PO TABS
25.0000 mg | ORAL_TABLET | Freq: Once | ORAL | Status: AC
  Administered 2022-12-30: 25 mg via ORAL
  Filled 2022-12-30: qty 2

## 2022-12-30 NOTE — Progress Notes (Signed)
Patient ambulated 100 ft around nursing station, stated she felt a lot less dizzy then when she came in, but still felt some weakness, noted steady gait while ambulating.

## 2022-12-30 NOTE — ED Provider Notes (Signed)
[place] EMERGENCY DEPARTMENT AT [facility] Provider Note   CSN: [id number] Arrival date & time: 12/29/22  2325     History  Chief Complaint  Patient presents with   Dizziness    Sara Mcmahon is a 62 y.o. female.  The history is provided by the patient.  Dizziness She has history of hyperlipidemia, Takotsubo cardiomyopathy, GERD and comes in because she has been dizzy all day.  She woke up at 5 AM to urinate, and noted that she was off balance.  She states it felt like she was drunk.  Dizziness actually seems to be worse when she lays down.  She has intermittently had a spinning sensation and has had some mild nausea but no vomiting.  She had a very mild frontal headache.  She denies any ear pain or tinnitus or hearing loss.  Symptoms have waxed and waned through the day, but she felt very dizzy when she laid down to go to bed tonight so she came into the emergency department for evaluation.  Last known normal was approximately 10 PM on 12/28/2022.   Home Medications Prior to Admission medications   Medication Sig Start Date End Date Taking? Authorizing Provider  ALPRAZolam Prudy Feeler) 0.5 MG tablet Take 1 tablet (0.5 mg total) by mouth at bedtime as needed for anxiety. 05/27/21   Wilson Singer, MD  ASHWAGANDHA PO Take by mouth.    [provider]  aspirin EC 81 MG EC tablet Take 1 tablet (81 mg total) by mouth daily. Swallow whole. 01/14/21   Manson Passey, PA  carvedilol (COREG) 3.125 MG tablet Take 1 tablet (3.125 mg total) by mouth 2 (two) times daily with a meal. 07/08/22   Jake Bathe, MD  cholecalciferol (VITAMIN D) 1000 units tablet Take 5,000 Units by mouth daily.    [provider]  fluticasone (FLONASE) 50 MCG/ACT nasal spray SHAKE LIQUID AND USE 1 SPRAY IN EACH NOSTRIL DAILY 04/16/21   [provider]  montelukast (SINGULAIR) 10 MG tablet Take 1 tablet (10 mg total) by mouth every other day. 05/22/21   Elenore Paddy, NP   nitroGLYCERIN (NITROSTAT) 0.4 MG SL tablet Place 1 tablet (0.4 mg total) under the tongue every 5 (five) minutes as needed for chest pain. 07/08/22   Jake Bathe, MD  NON FORMULARY Plexus    [provider]  potassium chloride (MICRO-K) 10 MEQ CR capsule TAKE 1 CAPSULE(10 MEQ) BY MOUTH DAILY 07/08/22   Jake Bathe, MD  Turmeric (QC TUMERIC COMPLEX PO) Take 1 tablet by mouth daily.    [provider]      Allergies    Omeprazole and Pollen extract    Review of Systems   Review of Systems  Neurological:  Positive for dizziness.  All other systems reviewed and are negative.   Physical Exam Updated Vital Signs BP (!) 141/65   Pulse 72   Temp 97.7 F (36.5 C) (Oral)   Resp 20   Ht 5\' 4"  (1.626 m)   Wt 61.7 kg   SpO2 100%   BMI 23.34 kg/m  Physical Exam Vitals and nursing note reviewed.   62 year old female, resting comfortably and in no acute distress. Vital signs are significant for borderline elevated blood pressure. Oxygen saturation is 100%, which is normal. Head is normocephalic and atraumatic. PERRLA, EOMI. Oropharynx is clear.  Tympanic membranes are clear. Neck is nontender and supple without adenopathy or JVD.  There are no carotid bruits.  Back is nontender and there is no CVA tenderness. Lungs are clear without rales, wheezes, or rhonchi. Chest is nontender. Heart has regular rate and rhythm without murmur. Abdomen is soft, flat, nontender. Extremities have no cyanosis or edema, full range of motion is present. Skin is warm and dry without rash. Neurologic: Mental status is normal, cranial nerves are intact, strength is 5/5 in all 4 extremities.  There is no pronator drift.  Finger-nose testing is normal.  There is no nystagmus.  Dizziness is reproduced by passive head movement.  ED Results / Procedures / Treatments   Labs (all labs ordered are listed, but only abnormal results are displayed) Labs Reviewed  BASIC METABOLIC PANEL - Abnormal;  Notable for the following components:      Result Value   Glucose, Bld 105 (*)    All other components within normal limits  CBC WITH DIFFERENTIAL/PLATELET - Abnormal; Notable for the following components:   RBC 3.86 (*)    Monocytes Absolute 1.1 (*)    All other components within normal limits    EKG EKG Interpretation  Date/Time:  Wednesday December 30 2022 00:08:28 EST Ventricular Rate:  66 PR Interval:  207 QRS Duration: 104 QT Interval:  413 QTC Calculation: 433 R Axis:   85 Text Interpretation: Sinus rhythm Borderline right axis deviation Borderline T abnormalities, anterior leads When compared with ECG of 01/14/2021, T wave abnormality has markedly improved Confirmed by Delora Fuel ([id number]) on 12/30/2022 12:21:59 AM  Procedures Procedures  Cardiac monitor shows normal sinus rhythm, per my interpretation.  Medications Ordered in ED Medications  ondansetron (ZOFRAN) injection 4 mg (has no administration in time range)  meclizine (ANTIVERT) tablet 25 mg (has no administration in time range)    ED Course/ Medical Decision Making/ A&P                             Medical Decision Making Amount and/or Complexity of Data Reviewed Labs: ordered.  Risk Prescription drug management.   Dizziness which seems to be peripheral vertigo.  No red flags to suggest central vertigo.  No signs of large vessel occlusion.  Last known normal greater than 24 hours, no indication for code stroke activation.  I have ordered screening labs of CBC, basic metabolic panel and I have ordered a dose of oral meclizine as well as intravenous ondansetron.  No indication for CT of head.  I have reviewed and interpreted her electrocardiogram, and my interpretation is borderline T wave abnormalities which are markedly improved compared with ECG of 01/14/2021.  If she does not respond to meclizine, will need to consider MRI to look for evidence of stroke.  I have reviewed and interpreted her laboratory tests,  and my interpretation is mild elevation of random glucose and otherwise normal CBC and basic metabolic panel.  Patient feels significantly improved following oral meclizine.  She was able to ambulate without any unsteadiness or loss of balance.  She is felt to be safe for discharge.  I am giving her a prescription for meclizine and she is to follow-up with her primary care provider.  Final Clinical Impression(s) / ED Diagnoses Final diagnoses:  Peripheral vertigo, unspecified laterality  Elevated random blood glucose level    Rx / DC Orders ED Discharge Orders          Ordered    meclizine (ANTIVERT) 25 MG tablet  3 times daily PRN        12/30/22  4782              Delora Fuel, MD 95/62/13 [phone]

## 2022-12-30 NOTE — Discharge Instructions (Signed)
Return if symptoms are not being adequately controlled at home. °

## 2022-12-30 NOTE — ED Triage Notes (Signed)
Pt states she woke with dizziness this am. Pt states she developed blurred vision at 2300.

## 2023-01-02 ENCOUNTER — Observation Stay (HOSPITAL_COMMUNITY)
Admission: EM | Admit: 2023-01-02 | Discharge: 2023-01-04 | Disposition: A | Discharge status: Home / Self Care | Payer: Medicare Other | Attending: Internal Medicine | Admitting: Internal Medicine

## 2023-01-02 ENCOUNTER — Other Ambulatory Visit: Payer: Self-pay

## 2023-01-02 ENCOUNTER — Encounter (HOSPITAL_COMMUNITY): Payer: Self-pay | Admitting: Emergency Medicine

## 2023-01-02 ENCOUNTER — Emergency Department (HOSPITAL_COMMUNITY): Payer: Medicare Other

## 2023-01-02 DIAGNOSIS — I10 Essential (primary) hypertension: Secondary | ICD-10-CM | POA: Diagnosis present

## 2023-01-02 DIAGNOSIS — R6883 Chills (without fever): Secondary | ICD-10-CM | POA: Diagnosis not present

## 2023-01-02 DIAGNOSIS — R6510 Systemic inflammatory response syndrome (SIRS) of non-infectious origin without acute organ dysfunction: Secondary | ICD-10-CM | POA: Diagnosis present

## 2023-01-02 DIAGNOSIS — I5181 Takotsubo syndrome: Secondary | ICD-10-CM | POA: Diagnosis present

## 2023-01-02 DIAGNOSIS — Z79899 Other long term (current) drug therapy: Secondary | ICD-10-CM | POA: Insufficient documentation

## 2023-01-02 DIAGNOSIS — R519 Headache, unspecified: Secondary | ICD-10-CM | POA: Diagnosis not present

## 2023-01-02 DIAGNOSIS — R42 Dizziness and giddiness: Secondary | ICD-10-CM

## 2023-01-02 DIAGNOSIS — E785 Hyperlipidemia, unspecified: Secondary | ICD-10-CM | POA: Diagnosis present

## 2023-01-02 DIAGNOSIS — R000 Tachycardia, unspecified: Secondary | ICD-10-CM | POA: Diagnosis not present

## 2023-01-02 DIAGNOSIS — R631 Polydipsia: Secondary | ICD-10-CM | POA: Diagnosis not present

## 2023-01-02 DIAGNOSIS — I110 Hypertensive heart disease with heart failure: Secondary | ICD-10-CM | POA: Diagnosis not present

## 2023-01-02 DIAGNOSIS — E860 Dehydration: Secondary | ICD-10-CM

## 2023-01-02 DIAGNOSIS — I509 Heart failure, unspecified: Secondary | ICD-10-CM | POA: Insufficient documentation

## 2023-01-02 DIAGNOSIS — Z7982 Long term (current) use of aspirin: Secondary | ICD-10-CM | POA: Insufficient documentation

## 2023-01-02 DIAGNOSIS — R531 Weakness: Secondary | ICD-10-CM | POA: Diagnosis not present

## 2023-01-02 DIAGNOSIS — Z1152 Encounter for screening for COVID-19: Secondary | ICD-10-CM | POA: Diagnosis not present

## 2023-01-02 DIAGNOSIS — E7800 Pure hypercholesterolemia, unspecified: Secondary | ICD-10-CM | POA: Diagnosis present

## 2023-01-02 DIAGNOSIS — I214 Non-ST elevation (NSTEMI) myocardial infarction: Secondary | ICD-10-CM | POA: Diagnosis present

## 2023-01-02 DIAGNOSIS — R059 Cough, unspecified: Secondary | ICD-10-CM | POA: Diagnosis not present

## 2023-01-02 LAB — CBC
HCT: 36.8 % (ref 36.0–46.0)
Hemoglobin: 12.4 g/dL (ref 12.0–15.0)
MCH: 31.6 pg (ref 26.0–34.0)
MCHC: 33.7 g/dL (ref 30.0–36.0)
MCV: 93.6 fL (ref 80.0–100.0)
Platelets: 205 10*3/uL (ref 150–400)
RBC: 3.93 MIL/uL (ref 3.87–5.11)
RDW: 13.2 % (ref 11.5–15.5)
WBC: 19.1 10*3/uL — ABNORMAL HIGH (ref 4.0–10.5)
nRBC: 0 % (ref 0.0–0.2)

## 2023-01-02 LAB — COMPREHENSIVE METABOLIC PANEL
ALT: 18 U/L (ref 0–44)
AST: 23 U/L (ref 15–41)
Albumin: 3.8 g/dL (ref 3.5–5.0)
Alkaline Phosphatase: 53 U/L (ref 38–126)
Anion gap: 8 (ref 5–15)
BUN: 17 mg/dL (ref 8–23)
CO2: 25 mmol/L (ref 22–32)
Calcium: 9.1 mg/dL (ref 8.9–10.3)
Chloride: 101 mmol/L (ref 98–111)
Creatinine, Ser: 0.84 mg/dL (ref 0.44–1.00)
GFR, Estimated: >60 mL/min (ref >60)
Glucose, Bld: 149 mg/dL — ABNORMAL HIGH (ref 70–99)
Potassium: 3.6 mmol/L (ref 3.5–5.1)
Sodium: 134 mmol/L — ABNORMAL LOW (ref 135–145)
Total Bilirubin: 0.6 mg/dL (ref 0.3–1.2)
Total Protein: 7 g/dL (ref 6.5–8.1)

## 2023-01-02 LAB — RESP PANEL BY RT-PCR (RSV, FLU A&B, COVID)  RVPGX2
Influenza A by PCR: NEGATIVE
Influenza B by PCR: NEGATIVE
Resp Syncytial Virus by PCR: NEGATIVE
SARS Coronavirus 2 by RT PCR: NEGATIVE

## 2023-01-02 LAB — PROTIME-INR
INR: 1 (ref 0.8–1.2)
Prothrombin Time: 13 seconds (ref 11.4–15.2)

## 2023-01-02 LAB — URINALYSIS, ROUTINE W REFLEX MICROSCOPIC
Bacteria, UA: NONE SEEN
Bilirubin Urine: NEGATIVE
Glucose, UA: NEGATIVE mg/dL
Ketones, ur: 20 mg/dL — AB
Nitrite: NEGATIVE
Protein, ur: 30 mg/dL — AB
Specific Gravity, Urine: 1.024 (ref 1.005–1.030)
pH: 6 (ref 5.0–8.0)

## 2023-01-02 LAB — APTT: aPTT: 28 seconds (ref 24–36)

## 2023-01-02 LAB — HIV ANTIBODY (ROUTINE TESTING W REFLEX): HIV Screen 4th Generation wRfx: NONREACTIVE

## 2023-01-02 LAB — LACTIC ACID, PLASMA: Lactic Acid, Venous: 0.9 mmol/L (ref 0.5–1.9)

## 2023-01-02 LAB — CBG MONITORING, ED: Glucose-Capillary: 124 mg/dL — ABNORMAL HIGH (ref 70–99)

## 2023-01-02 LAB — PROCALCITONIN: Procalcitonin: 0.1 ng/mL

## 2023-01-02 MED ORDER — ONDANSETRON HCL 4 MG/2ML IJ SOLN: 4.0000 mg | Freq: Four times a day (QID) | INTRAMUSCULAR | Status: DC | PRN

## 2023-01-02 MED ORDER — ACETAMINOPHEN 650 MG RE SUPP: 650.0000 mg | Freq: Four times a day (QID) | RECTAL | Status: DC | PRN

## 2023-01-02 MED ORDER — LACTATED RINGERS IV SOLN: INTRAVENOUS | Status: AC

## 2023-01-02 MED ORDER — VITAMIN D 25 MCG (1000 UNIT) PO TABS
2000.0000 [IU] | ORAL_TABLET | Freq: Every day | ORAL | Status: DC
  Administered 2023-01-02 – 2023-01-04 (×3): 2000 [IU] via ORAL
  Filled 2023-01-02 (×3): qty 2

## 2023-01-02 MED ORDER — ENOXAPARIN SODIUM 40 MG/0.4ML IJ SOSY
40.0000 mg | PREFILLED_SYRINGE | Freq: Every day | INTRAMUSCULAR | Status: DC
  Administered 2023-01-02: 40 mg via SUBCUTANEOUS
  Filled 2023-01-02: qty 0.4

## 2023-01-02 MED ORDER — ALPRAZOLAM 0.5 MG PO TABS
0.5000 mg | ORAL_TABLET | Freq: Every evening | ORAL | Status: DC | PRN
  Administered 2023-01-02 – 2023-01-03 (×2): 0.5 mg via ORAL
  Filled 2023-01-02 (×2): qty 1

## 2023-01-02 MED ORDER — MECLIZINE HCL 12.5 MG PO TABS: 12.5000 mg | ORAL_TABLET | Freq: Three times a day (TID) | ORAL | Status: DC | PRN

## 2023-01-02 MED ORDER — SODIUM CHLORIDE 0.9 % IV BOLUS
1000.0000 mL | Freq: Once | INTRAVENOUS | Status: AC
  Administered 2023-01-02: 1000 mL via INTRAVENOUS

## 2023-01-02 MED ORDER — MONTELUKAST SODIUM 10 MG PO TABS
10.0000 mg | ORAL_TABLET | ORAL | Status: DC
  Administered 2023-01-02 – 2023-01-04 (×2): 10 mg via ORAL
  Filled 2023-01-02 (×2): qty 1

## 2023-01-02 MED ORDER — SODIUM CHLORIDE 0.9 % IV SOLN
2.0000 g | INTRAVENOUS | Status: DC
  Administered 2023-01-02: 2 g via INTRAVENOUS
  Filled 2023-01-02: qty 20

## 2023-01-02 MED ORDER — ASPIRIN 81 MG PO TBEC
81.0000 mg | DELAYED_RELEASE_TABLET | Freq: Every day | ORAL | Status: DC
  Administered 2023-01-02 – 2023-01-04 (×3): 81 mg via ORAL
  Filled 2023-01-02 (×3): qty 1

## 2023-01-02 MED ORDER — BUTALBITAL-APAP-CAFFEINE 50-325-40 MG PO TABS
1.0000 | ORAL_TABLET | Freq: Four times a day (QID) | ORAL | Status: DC | PRN
  Administered 2023-01-02 – 2023-01-03 (×3): 1 via ORAL
  Filled 2023-01-02 (×3): qty 1

## 2023-01-02 MED ORDER — ONDANSETRON HCL 4 MG PO TABS
4.0000 mg | ORAL_TABLET | Freq: Four times a day (QID) | ORAL | Status: DC | PRN
  Administered 2023-01-02: 4 mg via ORAL
  Filled 2023-01-02: qty 1

## 2023-01-02 MED ORDER — FLUTICASONE PROPIONATE 50 MCG/ACT NA SUSP
1.0000 | Freq: Every day | NASAL | Status: DC
  Administered 2023-01-02 – 2023-01-04 (×3): 1 via NASAL
  Filled 2023-01-02: qty 16

## 2023-01-02 MED ORDER — ACETAMINOPHEN 325 MG PO TABS
650.0000 mg | ORAL_TABLET | Freq: Four times a day (QID) | ORAL | Status: DC | PRN
  Administered 2023-01-02 – 2023-01-03 (×2): 650 mg via ORAL
  Filled 2023-01-02 (×2): qty 2

## 2023-01-02 MED ORDER — CARVEDILOL 3.125 MG PO TABS
3.1250 mg | ORAL_TABLET | Freq: Two times a day (BID) | ORAL | Status: DC
  Administered 2023-01-03 – 2023-01-04 (×3): 3.125 mg via ORAL
  Filled 2023-01-02 (×5): qty 1

## 2023-01-02 NOTE — ED Notes (Signed)
Attempted to call report to floor, not ready for report.

## 2023-01-02 NOTE — Progress Notes (Signed)
Elink following for sepsis protocol. 

## 2023-01-02 NOTE — ED Notes (Signed)
Took pt to bathroom and hooked back up to monitor

## 2023-01-02 NOTE — ED Triage Notes (Signed)
Pt states she was recently seen here for dizziness. Returns this morning for dizziness, increased thirst, and weakness.

## 2023-01-02 NOTE — ED Notes (Signed)
EDP allowing pt to have clear liquids

## 2023-01-02 NOTE — H&P (Signed)
History and Physical    Sara Mcmahon WUJ:[id number] DOB: 1961-02-10 DOA: 01/02/2023  PCP: Elfredia Nevins, MD   Patient coming from: Home  Chief Complaint: Arnette Norris  HPI: Sara Mcmahon is a 62 y.o. female with medical history significant for Takotsubo cardiomyopathy/NSTEMI, CHF, hypertension, dyslipidemia, anxiety disorder, and GERD who states that she suddenly began to get dizzy 3-4 days ago when she got out of bed and had difficulty with walking.  She is generally felt weak over the last few days and has been laying in bed and has not been eating or drinking very much.  This morning, she started to have some chills as well as headache.  She denies any cough, shortness of breath, dysuria, diarrhea, nausea, or vomiting.  She denies any photophobia, neck stiffness, or recent upper respiratory illness.   ED Course: Vital signs with temperature elevation of 101.2 Fahrenheit and tachycardia noted.  EKG with sinus tachycardia.  Leukocytosis of 19,000 noted in urine analysis and chest x-ray with no acute findings.  No flu, COVID, or RSV noted.  No lactic acidosis.  Patient has been given IV fluid and started on Rocephin empirically.  Blood cultures collected and pending.  Review of Systems: Reviewed as noted above, otherwise negative.  Past Medical History:  Diagnosis Date   Cardiomyopathy [facility])    CHF (congestive heart failure) (HCC)    Fractures    GERD (gastroesophageal reflux disease)    Hepatitis    HLD (hyperlipidemia) 10/19/2019   Hypercholesteremia    Insomnia    Takotsubo cardiomyopathy 10/19/2019   Vitiligo     Past Surgical History:  Procedure Laterality Date   CARDIAC CATHETERIZATION     CARDIAC CATHETERIZATION     CESAREAN SECTION     LEFT HEART CATH AND CORONARY ANGIOGRAPHY N/A 01/13/2021   Procedure: LEFT HEART CATH AND CORONARY ANGIOGRAPHY;  Surgeon: Yvonne Kendall, MD;  Location: [facility] INVASIVE CV LAB;  Service: Cardiovascular;  Laterality: N/A;      reports that she has never smoked. She has never used smokeless tobacco. She reports that she does not drink alcohol and does not use drugs.  Allergies  Allergen Reactions   Omeprazole Swelling   Pollen Extract     Trees, grass, flowers    Family History  Problem Relation Age of Onset   Hypertension Mother    Cancer Mother    Heart disease Father    Hypertension Father     Prior to Admission medications   Medication Sig Start Date End Date Taking? Authorizing Provider  ALPRAZolam Prudy Feeler) 0.5 MG tablet Take 1 tablet (0.5 mg total) by mouth at bedtime as needed for anxiety. 05/27/21   Wilson Singer, MD  ASHWAGANDHA PO Take by mouth.    [provider]  aspirin EC 81 MG EC tablet Take 1 tablet (81 mg total) by mouth daily. Swallow whole. 01/14/21   Manson Passey, PA  carvedilol (COREG) 3.125 MG tablet Take 1 tablet (3.125 mg total) by mouth 2 (two) times daily with a meal. 07/08/22   Jake Bathe, MD  cholecalciferol (VITAMIN D) 1000 units tablet Take 5,000 Units by mouth daily.    [provider]  fluticasone (FLONASE) 50 MCG/ACT nasal spray SHAKE LIQUID AND USE 1 SPRAY IN EACH NOSTRIL DAILY 04/16/21   [provider]  meclizine (ANTIVERT) 25 MG tablet Take 1 tablet (25 mg total) by mouth 3 (three) times daily as needed for dizziness. 12/30/22   Dione Booze, MD  montelukast (SINGULAIR) 10  MG tablet Take 1 tablet (10 mg total) by mouth every other day. 05/22/21   Ailene Ards, NP  nitroGLYCERIN (NITROSTAT) 0.4 MG SL tablet Place 1 tablet (0.4 mg total) under the tongue every 5 (five) minutes as needed for chest pain. 07/08/22   Jerline Pain, MD  NON FORMULARY Plexus    [provider]  potassium chloride (MICRO-K) 10 MEQ CR capsule TAKE 1 CAPSULE(10 MEQ) BY MOUTH DAILY 07/08/22   Jerline Pain, MD  Turmeric (QC TUMERIC COMPLEX PO) Take 1 tablet by mouth daily.    [provider]    Physical Exam: Vitals:   01/02/23 0730 01/02/23 0750  01/02/23 0855 01/02/23 0857  BP: 132/69  131/69   Pulse: (!) 104  (!) 113   Resp: (!) 21  (!) 27   Temp:  99.9 F (37.7 C)  (!) 101.2 F (38.4 C)  TempSrc:  Oral  Axillary  SpO2: 96%  100%   Weight:      Height:        Constitutional: NAD, calm, comfortable Vitals:   01/02/23 0730 01/02/23 0750 01/02/23 0855 01/02/23 0857  BP: 132/69  131/69   Pulse: (!) 104  (!) 113   Resp: (!) 21  (!) 27   Temp:  99.9 F (37.7 C)  (!) 101.2 F (38.4 C)  TempSrc:  Oral  Axillary  SpO2: 96%  100%   Weight:      Height:       Eyes: lids and conjunctivae normal Neck: normal, supple Respiratory: clear to auscultation bilaterally. Normal respiratory effort. No accessory muscle use.  Cardiovascular: Regular rate and rhythm, no murmurs. Abdomen: no tenderness, no distention. Bowel sounds positive.  Musculoskeletal:  No edema. Skin: no rashes, lesions, ulcers.  Psychiatric: Flat affect  Labs on Admission: I have personally reviewed following labs and imaging studies  CBC: Recent Labs  Lab 12/30/22 0054 01/02/23 0654  WBC 8.5 19.1*  NEUTROABS 5.1  --   HGB 12.1 12.4  HCT 36.4 36.8  MCV 94.3 93.6  PLT 238 308   Basic Metabolic Panel: Recent Labs  Lab 12/30/22 0054 01/02/23 0653  NA 138 134*  K 3.6 3.6  CL 103 101  CO2 27 25  GLUCOSE 105* 149*  BUN 17 17  CREATININE 0.77 0.84  CALCIUM 9.4 9.1   GFR: Estimated Creatinine Clearance: 58.4 mL/min (by C-G formula based on SCr of 0.84 mg/dL). Liver Function Tests: Recent Labs  Lab 01/02/23 0653  AST 23  ALT 18  ALKPHOS 53  BILITOT 0.6  PROT 7.0  ALBUMIN 3.8   No results for input(s): "LIPASE", "AMYLASE" in the last 168 hours. No results for input(s): "AMMONIA" in the last 168 hours. Coagulation Profile: Recent Labs  Lab 01/02/23 0653  INR 1.0   Cardiac Enzymes: No results for input(s): "CKTOTAL", "CKMB", "CKMBINDEX", "TROPONINI" in the last 168 hours. BNP (last 3 results) No results for input(s): "PROBNP" in the  last 8760 hours. HbA1C: No results for input(s): "HGBA1C" in the last 72 hours. CBG: Recent Labs  Lab 01/02/23 0631  GLUCAP 124*   Lipid Profile: No results for input(s): "CHOL", "HDL", "LDLCALC", "TRIG", "CHOLHDL", "LDLDIRECT" in the last 72 hours. Thyroid Function Tests: No results for input(s): "TSH", "T4TOTAL", "FREET4", "T3FREE", "THYROIDAB" in the last 72 hours. Anemia Panel: No results for input(s): "VITAMINB12", "FOLATE", "FERRITIN", "TIBC", "IRON", "RETICCTPCT" in the last 72 hours. Urine analysis:    Component Value Date/Time   COLORURINE YELLOW 01/02/2023 6578  APPEARANCEUR CLEAR 01/02/2023 0628   LABSPEC 1.024 01/02/2023 0628   PHURINE 6.0 01/02/2023 0628   GLUCOSEU NEGATIVE 01/02/2023 0628   HGBUR SMALL (A) 01/02/2023 0628   BILIRUBINUR NEGATIVE 01/02/2023 0628   KETONESUR 20 (A) 01/02/2023 0628   PROTEINUR 30 (A) 01/02/2023 0628   UROBILINOGEN 0.2 09/11/2014 1801   NITRITE NEGATIVE 01/02/2023 0628   LEUKOCYTESUR TRACE (A) 01/02/2023 0628    Radiological Exams on Admission: DG Chest Portable 1 View  Result Date: 01/02/2023 CLINICAL DATA:  62 year old female with cough. EXAM: PORTABLE CHEST 1 VIEW COMPARISON:  Portable chest 01/12/2021 and earlier. FINDINGS: Portable AP upright view at 0648 hours. Mildly lower lung volumes. Mediastinal contours remain normal. Visualized tracheal air column is within normal limits. Allowing for portable technique the lungs are clear. No pneumothorax or pleural effusion. Negative visible bowel gas. No acute osseous abnormality identified. IMPRESSION: Negative portable chest. Electronically Signed   By: Genevie Ann M.D.   On: 01/02/2023 06:59    EKG: Independently reviewed. ST 107bpm.  Assessment/Plan Principal Problem:   SIRS (systemic inflammatory response syndrome) (HCC) Active Problems:   Essential hypertension, benign   Takotsubo cardiomyopathy   HLD (hyperlipidemia)   NSTEMI (non-ST elevated myocardial infarction) (HCC)     SIRS criteria with possible source of infection Noted to have fever, tachycardia, and leukocytosis Given 1 dose of Rocephin in the ED, administer further doses if procalcitonin elevated Check procalcitonin Monitor leukocytosis Blood cultures ordered in ED and pending No sign of UTI or pneumonia  Dizziness with possible orthostasis/vertigo Check OSV Continue aggressive IV fluid Consider head CT and eventual MRI if symptoms persist despite proper hydration and rule out of orthostasis  Hypertension Continue carvedilol  Dyslipidemia Crestor 20 mg p.o. daily  GERD PPI  Anxiety Xanax as needed  History of NSTEMI/Takotsubo cardiomyopathy Continue aspirin and carvedilol  DVT prophylaxis: Lovenox Code Status: Full Family Communication: None at bedside Disposition Plan: Observation for SIRS criteria Consults called: None Admission status: Observation, telemetry  Severity of Illness: The appropriate patient status for this patient is OBSERVATION. Observation status is judged to be reasonable and necessary in order to provide the required intensity of service to ensure the patient's safety. The patient's presenting symptoms, physical exam findings, and initial radiographic and laboratory data in the context of their medical condition is felt to place them at decreased risk for further clinical deterioration. Furthermore, it is anticipated that the patient will be medically stable for discharge from the hospital within 2 midnights of admission.    Ruhan Borak D Manuella Ghazi DO [facility]  If 7PM-7AM, please contact night-coverage [email or web address]  01/02/2023, 9:26 AM

## 2023-01-02 NOTE — ED Notes (Signed)
Pt ambulated to BR with steady gait. Tolerated well.

## 2023-01-02 NOTE — ED Provider Triage Note (Signed)
Emergency Medicine Provider Triage Evaluation Note  Sara Mcmahon , a 62 y.o. female  was evaluated in triage.  Pt complains of dizziness since yesterday.  Review of Systems  Positive: Cough, increased thirst Negative: Focal weakness  Physical Exam  BP 139/72 (BP Location: Left Arm)   Pulse (!) 115   Temp 100.2 F (37.9 C) (Oral)   Resp 20   Ht 1.626 m (5\' 4" )   Wt 62 kg   SpO2 97%   BMI 23.46 kg/m  Gen:   Awake, no distress   Resp:  Normal effort  MSK:   Moves extremities without difficulty  Other:    Medical Decision Making  Medically screening exam initiated at 6:36 AM.  Appropriate orders placed.  Sara Mcmahon was informed that the remainder of the evaluation will be completed by another provider, this initial triage assessment does not replace that evaluation, and the importance of remaining in the ED until their evaluation is complete.   No signs of acute CVA at this time.  Workup pending at this time   Ripley Fraise, MD 01/02/23 [id number]

## 2023-01-02 NOTE — ED Provider Notes (Signed)
[facility] Provider Note   CSN: [id number] Arrival date & time: 01/02/23  4034     History  Chief Complaint  Patient presents with   Weakness   Dizziness    JENYFER TRAWICK is a 62 y.o. female.   Weakness Associated symptoms: dizziness   Dizziness Associated symptoms: weakness    This patient is a 63 year old female, she has a history of some seasonal allergies, history of what she describes as Takotsubo's cardiomyopathy with some congestive heart failure currently taking carvedilol and a baby aspirin.  She reports that approximately 3 to 4 days ago she awoke feeling a dizzy feeling when she got out of bed and had a difficult time walking stating that she was bumping into walls, this gets better when she lays down worse when she gets up.  For the last couple of days she has been feeling generally weak and laying in the bed not drinking very much water and feeling very dehydrated.  She is now this morning started to feel some chills, she does have a headache but denies any coughing shortness of breath or chest pain, she denies swelling of the legs, she did have some urinary symptoms a couple of weeks ago including some dysuria but that seems to have passed.  There has been no nausea vomiting or diarrhea.  No sick contacts as far she knows, she denies having any obstructive coronary disease.  Heart catheterization was performed February 2022, it showed minimal 30% proximal LAD obstructive disease, vasospasm in the right coronary that improved with intracoronary nitro, she had severe reduced left ventricular contraction at that time.  Echocardiogram was performed 2 months later in April 2022, ejection fraction had increased from 25% up to 65% back to normal.    Review of the medical record shows that the patient has not had any other recent admissions to the hospital, she follows closely in the office with her primary care physician Dr.  Anastasio Champion   Home Medications Prior to Admission medications   Medication Sig Start Date End Date Taking? Authorizing Provider  ALPRAZolam Duanne Moron) 0.5 MG tablet Take 1 tablet (0.5 mg total) by mouth at bedtime as needed for anxiety. 05/27/21   Doree Albee, MD  ASHWAGANDHA PO Take by mouth.    [provider]  aspirin EC 81 MG EC tablet Take 1 tablet (81 mg total) by mouth daily. Swallow whole. 01/14/21   Leanor Kail, PA  carvedilol (COREG) 3.125 MG tablet Take 1 tablet (3.125 mg total) by mouth 2 (two) times daily with a meal. 07/08/22   Jerline Pain, MD  cholecalciferol (VITAMIN D) 1000 units tablet Take 5,000 Units by mouth daily.    [provider]  fluticasone (FLONASE) 50 MCG/ACT nasal spray SHAKE LIQUID AND USE 1 SPRAY IN EACH NOSTRIL DAILY 04/16/21   [provider]  meclizine (ANTIVERT) 25 MG tablet Take 1 tablet (25 mg total) by mouth 3 (three) times daily as needed for dizziness. 7/42/59   Delora Fuel, MD  montelukast (SINGULAIR) 10 MG tablet Take 1 tablet (10 mg total) by mouth every other day. 05/22/21   Ailene Ards, NP  nitroGLYCERIN (NITROSTAT) 0.4 MG SL tablet Place 1 tablet (0.4 mg total) under the tongue every 5 (five) minutes as needed for chest pain. 07/08/22   Jerline Pain, MD  NON FORMULARY Plexus    [provider]  potassium chloride (MICRO-K) 10 MEQ CR capsule TAKE 1 CAPSULE(10 MEQ)  BY MOUTH DAILY 07/08/22   Jerline Pain, MD  Turmeric (QC TUMERIC COMPLEX PO) Take 1 tablet by mouth daily.    [provider]      Allergies    Omeprazole and Pollen extract    Review of Systems   Review of Systems  Neurological:  Positive for dizziness and weakness.  All other systems reviewed and are negative.   Physical Exam Updated Vital Signs BP 132/69   Pulse (!) 104   Temp 99.9 F (37.7 C) (Oral)   Resp (!) 21   Ht 1.626 m (5\' 4" )   Wt 62 kg   SpO2 96%   BMI 23.46 kg/m  Physical Exam Vitals and nursing note  reviewed.  Constitutional:      General: She is not in acute distress.    Appearance: She is well-developed.  HENT:     Head: Normocephalic and atraumatic.     Mouth/Throat:     Mouth: Mucous membranes are dry.     Pharynx: No oropharyngeal exudate.  Eyes:     General: No scleral icterus.       Right eye: No discharge.        Left eye: No discharge.     Conjunctiva/sclera: Conjunctivae normal.     Pupils: Pupils are equal, round, and reactive to light.  Neck:     Thyroid: No thyromegaly.     Vascular: No JVD.  Cardiovascular:     Rate and Rhythm: Regular rhythm. Tachycardia present.     Heart sounds: Normal heart sounds. No murmur heard.    No friction rub. No gallop.  Pulmonary:     Effort: Pulmonary effort is normal. No respiratory distress.     Breath sounds: Normal breath sounds. No wheezing or rales.  Abdominal:     General: Bowel sounds are normal. There is no distension.     Palpations: Abdomen is soft. There is no mass.     Tenderness: There is no abdominal tenderness.  Musculoskeletal:        General: No tenderness. Normal range of motion.     Cervical back: Normal range of motion and neck supple.     Right lower leg: No edema.     Left lower leg: No edema.  Lymphadenopathy:     Cervical: No cervical adenopathy.  Skin:    General: Skin is warm and dry.     Findings: No erythema or rash.  Neurological:     Mental Status: She is alert.     Coordination: Coordination normal.  Psychiatric:        Behavior: Behavior normal.     ED Results / Procedures / Treatments   Labs (all labs ordered are listed, but only abnormal results are displayed) Labs Reviewed  CBC - Abnormal; Notable for the following components:      Result Value   WBC 19.1 (*)    All other components within normal limits  URINALYSIS, ROUTINE W REFLEX MICROSCOPIC - Abnormal; Notable for the following components:   Hgb urine dipstick SMALL (*)    Ketones, ur 20 (*)    Protein, ur 30 (*)     Leukocytes,Ua TRACE (*)    All other components within normal limits  COMPREHENSIVE METABOLIC PANEL - Abnormal; Notable for the following components:   Sodium 134 (*)    Glucose, Bld 149 (*)    All other components within normal limits  CBG MONITORING, ED - Abnormal; Notable for the following components:  Glucose-Capillary 124 (*)    All other components within normal limits  RESP PANEL BY RT-PCR (RSV, FLU A&B, COVID)  RVPGX2  CULTURE, BLOOD (ROUTINE X 2)  CULTURE, BLOOD (ROUTINE X 2)  LACTIC ACID, PLASMA  PROTIME-INR  APTT  PROCALCITONIN    EKG EKG Interpretation  Date/Time:  Saturday January 02 2023 06:25:55 EST Ventricular Rate:  107 PR Interval:  197 QRS Duration: 86 QT Interval:  312 QTC Calculation: 417 R Axis:   81 Text Interpretation: Sinus tachycardia Borderline right axis deviation Borderline repolarization abnormality Confirmed by Zadie Rhine ([id number]) on 01/02/2023 6:38:07 AM  Radiology DG Chest Portable 1 View  Result Date: 01/02/2023 CLINICAL DATA:  63 year old female with cough. EXAM: PORTABLE CHEST 1 VIEW COMPARISON:  Portable chest 01/12/2021 and earlier. FINDINGS: Portable AP upright view at 0648 hours. Mildly lower lung volumes. Mediastinal contours remain normal. Visualized tracheal air column is within normal limits. Allowing for portable technique the lungs are clear. No pneumothorax or pleural effusion. Negative visible bowel gas. No acute osseous abnormality identified. IMPRESSION: Negative portable chest. Electronically Signed   By: Odessa Fleming M.D.   On: 01/02/2023 06:59    Procedures Procedures    Medications Ordered in ED Medications  lactated ringers infusion ( Intravenous New Bag/Given 01/02/23 0746)  cefTRIAXone (ROCEPHIN) 2 g in sodium chloride 0.9 % 100 mL IVPB (2 g Intravenous New Bag/Given 01/02/23 0749)  sodium chloride 0.9 % bolus 1,000 mL (1,000 mLs Intravenous New Bag/Given 01/02/23 0745)  sodium chloride 0.9 % bolus 1,000 mL (1,000 mLs  Intravenous New Bag/Given 01/02/23 0745)    ED Course/ Medical Decision Making/ A&P Clinical Course as of 01/02/23 0853  Sat Jan 02, 2023  0851 Patient reexamined, still tachycardic, still feeling generally weak, no focal findings on exam to suggest a source of infection but with SIRS criteria she will be admitted to the hospital, Dr. Sherryll Burger has been kind enough to see this patient for admission and observation [BM]    Clinical Course User Index [BM] Eber Hong, MD                             Medical Decision Making Amount and/or Complexity of Data Reviewed Labs: ordered. Radiology: ordered.  Risk Prescription drug management. Decision regarding hospitalization.   This patient presents to the ED for concern of weakness and dizziness, this involves an extensive number of treatment options, and is a complaint that carries with it a high risk of complications and morbidity.  The differential diagnosis includes underlying infection, vertigo, dehydration, new onset diabetes   Co morbidities that complicate the patient evaluation  History of cardiomyopathy   Additional history obtained:  Additional history obtained from electronic medical record External records from outside source obtained and reviewed including see HPI   Lab Tests:  I Ordered, and personally interpreted labs.  The pertinent results include: Leukocytosis of 19,000, metabolic panel reassuring, no anemia, lactate 0.9, urinalysis without infection but with signs of ketones, could negative for COVID and flu   Imaging Studies ordered:  I ordered imaging studies including chest x-ray I independently visualized and interpreted imaging which showed no acute findings I agree with the radiologist interpretation   Cardiac Monitoring: / EKG:  The patient was maintained on a cardiac monitor.  I personally viewed and interpreted the cardiac monitored which showed an underlying rhythm of: Tachycardia   Consultations  Obtained:  I requested consultation with the hospitalist,  and discussed  lab and imaging findings as well as pertinent plan - they recommend: Admit for observation, cultures pending   Problem List / ED Course / Critical interventions / Medication management  Unclear exactly what is causing the systemic inflammatory response syndrome, patient continues to be tachycardic with leukocytosis and low-grade fever I ordered medication including Rocephin for possible infection Reevaluation of the patient after these medicines showed that the patient stayed the same I have reviewed the patients home medicines and have made adjustments as needed   Social Determinants of Health:  None   Test / Admission - Considered:  Will admit to the hospital         Final Clinical Impression(s) / ED Diagnoses Final diagnoses:  Vertigo  Systemic inflammatory response syndrome ([place])  Dehydration    Rx / DC Orders ED Discharge Orders     None         Noemi Chapel, MD 01/02/23 [phone]

## 2023-01-02 NOTE — ED Notes (Signed)
ED Provider at bedside. 

## 2023-01-03 ENCOUNTER — Observation Stay (HOSPITAL_COMMUNITY): Payer: Medicare Other

## 2023-01-03 DIAGNOSIS — R29818 Other symptoms and signs involving the nervous system: Secondary | ICD-10-CM | POA: Diagnosis not present

## 2023-01-03 DIAGNOSIS — J3489 Other specified disorders of nose and nasal sinuses: Secondary | ICD-10-CM | POA: Diagnosis not present

## 2023-01-03 DIAGNOSIS — A419 Sepsis, unspecified organism: Secondary | ICD-10-CM | POA: Diagnosis not present

## 2023-01-03 DIAGNOSIS — R6510 Systemic inflammatory response syndrome (SIRS) of non-infectious origin without acute organ dysfunction: Secondary | ICD-10-CM | POA: Diagnosis not present

## 2023-01-03 DIAGNOSIS — I672 Cerebral atherosclerosis: Secondary | ICD-10-CM | POA: Diagnosis not present

## 2023-01-03 LAB — BASIC METABOLIC PANEL
Anion gap: 7 (ref 5–15)
BUN: 10 mg/dL (ref 8–23)
CO2: 24 mmol/L (ref 22–32)
Calcium: 8.4 mg/dL — ABNORMAL LOW (ref 8.9–10.3)
Chloride: 107 mmol/L (ref 98–111)
Creatinine, Ser: 0.75 mg/dL (ref 0.44–1.00)
GFR, Estimated: >60 mL/min (ref >60)
Glucose, Bld: 102 mg/dL — ABNORMAL HIGH (ref 70–99)
Potassium: 3 mmol/L — ABNORMAL LOW (ref 3.5–5.1)
Sodium: 138 mmol/L (ref 135–145)

## 2023-01-03 LAB — CBC
HCT: 31.2 % — ABNORMAL LOW (ref 36.0–46.0)
Hemoglobin: 10.2 g/dL — ABNORMAL LOW (ref 12.0–15.0)
MCH: 31.1 pg (ref 26.0–34.0)
MCHC: 32.7 g/dL (ref 30.0–36.0)
MCV: 95.1 fL (ref 80.0–100.0)
Platelets: 166 10*3/uL (ref 150–400)
RBC: 3.28 MIL/uL — ABNORMAL LOW (ref 3.87–5.11)
RDW: 13.7 % (ref 11.5–15.5)
WBC: 16.9 10*3/uL — ABNORMAL HIGH (ref 4.0–10.5)
nRBC: 0 % (ref 0.0–0.2)

## 2023-01-03 LAB — RETICULOCYTES
Immature Retic Fract: 11.8 % (ref 2.3–15.9)
RBC.: 3.31 MIL/uL — ABNORMAL LOW (ref 3.87–5.11)
Retic Count, Absolute: 49.7 10*3/uL (ref 19.0–186.0)
Retic Ct Pct: 1.5 % (ref 0.4–3.1)

## 2023-01-03 LAB — IRON AND TIBC
Iron: 11 ug/dL — ABNORMAL LOW (ref 28–170)
Saturation Ratios: 5 % — ABNORMAL LOW (ref 10.4–31.8)
TIBC: 222 ug/dL — ABNORMAL LOW (ref 250–450)
UIBC: 211 ug/dL

## 2023-01-03 LAB — FOLATE: Folate: 34 ng/mL (ref >5.9)

## 2023-01-03 LAB — VITAMIN B12: Vitamin B-12: 265 pg/mL (ref 180–914)

## 2023-01-03 LAB — MAGNESIUM: Magnesium: 1.7 mg/dL (ref 1.7–2.4)

## 2023-01-03 LAB — FERRITIN: Ferritin: 108 ng/mL (ref 11–307)

## 2023-01-03 MED ORDER — POTASSIUM CHLORIDE CRYS ER 20 MEQ PO TBCR
40.0000 meq | EXTENDED_RELEASE_TABLET | Freq: Two times a day (BID) | ORAL | Status: AC
  Administered 2023-01-03 (×2): 40 meq via ORAL
  Filled 2023-01-03 (×2): qty 2

## 2023-01-03 NOTE — Care Management Obs Status (Signed)
[facility] NOTIFICATION   Patient Details  Name: Sara Mcmahon MRN: [id number] Date of Birth: [birth year]   Medicare Observation Status Notification Given:  Yes    Iona Beard, [place] 01/03/2023, 11:22 AM

## 2023-01-03 NOTE — Progress Notes (Signed)
PROGRESS NOTE    Sara Mcmahon  QPY:[id number] DOB: [birth year] DOA: 01/02/2023 PCP: Redmond School, MD   Brief Narrative:    Sara Mcmahon is a 62 y.o. female with medical history significant for Takotsubo cardiomyopathy/NSTEMI, CHF, hypertension, dyslipidemia, anxiety disorder, and GERD who states that she suddenly began to get dizzy 3-4 days ago when she got out of bed and had difficulty with walking.  She was admitted with SIRS criteria with leukocytosis and ongoing fever of unknown origin.  CT of the head pending today to assess for possible CVA.  Blood cultures with no growth noted thus far and procalcitonin low.  Assessment & Plan:   Principal Problem:   SIRS (systemic inflammatory response syndrome) (HCC) Active Problems:   Essential hypertension, benign   Takotsubo cardiomyopathy   HLD (hyperlipidemia)   NSTEMI (non-ST elevated myocardial infarction) (HCC)  Assessment and Plan:  SIRS criteria with possible source of infection versus relation to TIA/CVA Noted to have fever, tachycardia, and leukocytosis that is improving Given 1 dose of Rocephin in the ED, no other antibiotics for now as procalcitonin is low Monitor leukocytosis which is downtrending Blood cultures with no growth to date No sign of UTI or pneumonia   Dizziness with possible orthostasis/vertigo CT of the head today and consider MRI in a.m.  Hypokalemia Replete and reevaluate in a.m.   Hypertension Continue carvedilol   Dyslipidemia Crestor 20 mg p.o. daily   GERD PPI   Anxiety Xanax as needed   History of NSTEMI/Takotsubo cardiomyopathy Continue aspirin and carvedilol    DVT prophylaxis: Lovenox Code Status: Full Family Communication: None at bedside Disposition Plan:  Status is: Observation The patient will require care spanning > 2 midnights and should be moved to inpatient because: Need for ongoing evaluation.  Consultants:  None  Procedures:  None  Antimicrobials:   Anti-infectives (From admission, onward)    Start     Dose/Rate Route Frequency Ordered Stop   01/02/23 0715  cefTRIAXone (ROCEPHIN) 2 g in sodium chloride 0.9 % 100 mL IVPB  Status:  Discontinued        2 g 200 mL/hr over 30 Minutes Intravenous Every 24 hours 01/02/23 0706 01/02/23 0944       Subjective: Patient seen and evaluated today with no new acute complaints or concerns. No acute concerns or events noted overnight.  Patient did have fever overnight and some chills noted.  She denies any headache, dizziness, photophobia, neck stiffness or other concerns this morning.  She states that she is more steady with ambulation.  Objective: Vitals:   01/02/23 2010 01/02/23 2200 01/03/23 0108 01/03/23 0400  BP: 130/66 (!) 113/55 (!) 114/52 115/74  Pulse:  100 96 (!) 103  Resp:  (!) 22 (!) 24 20  Temp:  (!) 100.4 F (38 C) (!) 101.3 F (38.5 C) 98.6 F (37 C)  TempSrc:    Oral  SpO2:  97% 96% 98%  Weight:      Height:        Intake/Output Summary (Last 24 hours) at 01/03/2023 0913 Last data filed at 01/02/2023 1812 Gross per 24 hour  Intake 1652.26 ml  Output --  Net 1652.26 ml   Filed Weights   01/02/23 0621  Weight: 62 kg    Examination:  General exam: Appears calm and comfortable  Respiratory system: Clear to auscultation. Respiratory effort normal. Cardiovascular system: S1 & S2 heard, RRR.  Gastrointestinal system: Abdomen is soft Central nervous system: Alert and awake Extremities: No edema  Skin: No significant lesions noted Psychiatry: Flat affect.    Data Reviewed: I have personally reviewed following labs and imaging studies  CBC: Recent Labs  Lab 12/30/22 0054 01/02/23 0654 01/03/23 0346  WBC 8.5 19.1* 16.9*  NEUTROABS 5.1  --   --   HGB 12.1 12.4 10.2*  HCT 36.4 36.8 31.2*  MCV 94.3 93.6 95.1  PLT 238 205 597   Basic Metabolic Panel: Recent Labs  Lab 12/30/22 0054 01/02/23 0653 01/03/23 0346  NA 138 134* 138  K 3.6 3.6 3.0*  CL 103  101 107  CO2 27 25 24   GLUCOSE 105* 149* 102*  BUN 17 17 10   CREATININE 0.77 0.84 0.75  CALCIUM 9.4 9.1 8.4*  MG  --   --  1.7   GFR: Estimated Creatinine Clearance: 61.3 mL/min (by C-G formula based on SCr of 0.75 mg/dL). Liver Function Tests: Recent Labs  Lab 01/02/23 0653  AST 23  ALT 18  ALKPHOS 53  BILITOT 0.6  PROT 7.0  ALBUMIN 3.8   No results for input(s): "LIPASE", "AMYLASE" in the last 168 hours. No results for input(s): "AMMONIA" in the last 168 hours. Coagulation Profile: Recent Labs  Lab 01/02/23 0653  INR 1.0   Cardiac Enzymes: No results for input(s): "CKTOTAL", "CKMB", "CKMBINDEX", "TROPONINI" in the last 168 hours. BNP (last 3 results) No results for input(s): "PROBNP" in the last 8760 hours. HbA1C: No results for input(s): "HGBA1C" in the last 72 hours. CBG: Recent Labs  Lab 01/02/23 0631  GLUCAP 124*   Lipid Profile: No results for input(s): "CHOL", "HDL", "LDLCALC", "TRIG", "CHOLHDL", "LDLDIRECT" in the last 72 hours. Thyroid Function Tests: No results for input(s): "TSH", "T4TOTAL", "FREET4", "T3FREE", "THYROIDAB" in the last 72 hours. Anemia Panel: Recent Labs    01/03/23 0346  RETICCTPCT 1.5   Sepsis Labs: Recent Labs  Lab 01/02/23 0706 01/02/23 0855  PROCALCITON  --  0.10  LATICACIDVEN 0.9  --     Recent Results (from the past 240 hour(s))  Resp panel by RT-PCR (RSV, Flu A&B, Covid) Anterior Nasal Swab     Status: None   Collection Time: 01/02/23  6:34 AM   Specimen: Anterior Nasal Swab  Result Value Ref Range Status   SARS Coronavirus 2 by RT PCR NEGATIVE NEGATIVE Final    Comment: (NOTE) SARS-CoV-2 target nucleic acids are NOT DETECTED.  The SARS-CoV-2 RNA is generally detectable in upper respiratory specimens during the acute phase of infection. The lowest concentration of SARS-CoV-2 viral copies this assay can detect is 138 copies/mL. A negative result does not preclude SARS-Cov-2 infection and should not be used as  the sole basis for treatment or other patient management decisions. A negative result may occur with  improper specimen collection/handling, submission of specimen other than nasopharyngeal swab, presence of viral mutation(s) within the areas targeted by this assay, and inadequate number of viral copies(<138 copies/mL). A negative result must be combined with clinical observations, patient history, and epidemiological information. The expected result is Negative.  Fact Sheet for Patients:  EntrepreneurPulse.com.au  Fact Sheet for Healthcare Providers:  IncredibleEmployment.be  This test is no t yet approved or cleared by the [place] FDA and  has been authorized for detection and/or diagnosis of SARS-CoV-2 by FDA under an Emergency Use Authorization (EUA). This EUA will remain  in effect (meaning this test can be used) for the duration of the COVID-19 declaration under Section 564(b)(1) of the Act, 21 U.S.C.section 360bbb-3(b)(1), unless the authorization is terminated  or revoked sooner.       Influenza A by PCR NEGATIVE NEGATIVE Final   Influenza B by PCR NEGATIVE NEGATIVE Final    Comment: (NOTE) The Xpert Xpress SARS-CoV-2/FLU/RSV plus assay is intended as an aid in the diagnosis of influenza from Nasopharyngeal swab specimens and should not be used as a sole basis for treatment. Nasal washings and aspirates are unacceptable for Xpert Xpress SARS-CoV-2/FLU/RSV testing.  Fact Sheet for Patients: BloggerCourse.com  Fact Sheet for Healthcare Providers: SeriousBroker.it  This test is not yet approved or cleared by the [place] FDA and has been authorized for detection and/or diagnosis of SARS-CoV-2 by FDA under an Emergency Use Authorization (EUA). This EUA will remain in effect (meaning this test can be used) for the duration of the COVID-19 declaration under Section 564(b)(1) of  the Act, 21 U.S.C. section 360bbb-3(b)(1), unless the authorization is terminated or revoked.     Resp Syncytial Virus by PCR NEGATIVE NEGATIVE Final    Comment: (NOTE) Fact Sheet for Patients: BloggerCourse.com  Fact Sheet for Healthcare Providers: SeriousBroker.it  This test is not yet approved or cleared by the [place] FDA and has been authorized for detection and/or diagnosis of SARS-CoV-2 by FDA under an Emergency Use Authorization (EUA). This EUA will remain in effect (meaning this test can be used) for the duration of the COVID-19 declaration under Section 564(b)(1) of the Act, 21 U.S.C. section 360bbb-3(b)(1), unless the authorization is terminated or revoked.  Performed at [facility], [address], [place], [place] [address]   Blood Culture (routine x 2)     Status: None (Preliminary result)   Collection Time: 01/02/23  7:06 AM   Specimen: Left Antecubital; Blood  Result Value Ref Range Status   Specimen Description   Final    LEFT ANTECUBITAL BOTTLES DRAWN AEROBIC AND ANAEROBIC   Special Requests   Final    Blood Culture results may not be optimal due to an excessive volume of blood received in culture bottles   Culture   Final    NO GROWTH < 24 HOURS Performed at [facility], [address], [place], [place] [address]    Report Status PENDING  Incomplete  Blood Culture (routine x 2)     Status: None (Preliminary result)   Collection Time: 01/02/23  7:11 AM   Specimen: BLOOD RIGHT HAND  Result Value Ref Range Status   Specimen Description BLOOD RIGHT HAND BOTTLES DRAWN AEROBIC ONLY  Final   Special Requests Blood Culture adequate volume  Final   Culture   Final    NO GROWTH < 24 HOURS Performed at [facility], [address], [place], [place] [address]    Report Status PENDING  Incomplete         Radiology Studies: DG Chest Portable 1 View  Result Date: 01/02/2023 CLINICAL DATA:  62 year old  female with cough. EXAM: PORTABLE CHEST 1 VIEW COMPARISON:  Portable chest 01/12/2021 and earlier. FINDINGS: Portable AP upright view at 0648 hours. Mildly lower lung volumes. Mediastinal contours remain normal. Visualized tracheal air column is within normal limits. Allowing for portable technique the lungs are clear. No pneumothorax or pleural effusion. Negative visible bowel gas. No acute osseous abnormality identified. IMPRESSION: Negative portable chest. Electronically Signed   By: Odessa Fleming M.D.   On: 01/02/2023 06:59        Scheduled Meds:  aspirin EC  81 mg Oral Daily   carvedilol  3.125 mg Oral BID WC   cholecalciferol  2,000  Units Oral Daily   fluticasone  1 spray Each Nare Daily   montelukast  10 mg Oral QODAY   potassium chloride  40 mEq Oral BID     LOS: 0 days    Time spent: 35 minutes    Joleena Weisenburger Hoover Brunette, DO [facility]  If 7PM-7AM, please contact night-coverage [email or web address] 01/03/2023, 9:13 AM

## 2023-01-03 NOTE — Progress Notes (Signed)
  Transition of Care [facility]) Screening Note   Patient Details  Name: Sara Mcmahon Date of Birth: 08-29-1961   Transition of Care [facility]) CM/SW Contact:    Iona Beard, [place] Phone Number: 01/03/2023, 10:23 AM    Transition of Care Department [facility]) has reviewed patient and no TOC needs have been identified at this time. We will continue to monitor patient advancement through interdisciplinary progression rounds. If new patient transition needs arise, please place a TOC consult.

## 2023-01-04 ENCOUNTER — Observation Stay (HOSPITAL_COMMUNITY): Payer: Medicare Other

## 2023-01-04 DIAGNOSIS — I6782 Cerebral ischemia: Secondary | ICD-10-CM | POA: Diagnosis not present

## 2023-01-04 DIAGNOSIS — R6510 Systemic inflammatory response syndrome (SIRS) of non-infectious origin without acute organ dysfunction: Secondary | ICD-10-CM | POA: Diagnosis not present

## 2023-01-04 DIAGNOSIS — J3489 Other specified disorders of nose and nasal sinuses: Secondary | ICD-10-CM | POA: Diagnosis not present

## 2023-01-04 DIAGNOSIS — R42 Dizziness and giddiness: Secondary | ICD-10-CM | POA: Diagnosis not present

## 2023-01-04 LAB — BASIC METABOLIC PANEL
Anion gap: 7 (ref 5–15)
BUN: 11 mg/dL (ref 8–23)
CO2: 24 mmol/L (ref 22–32)
Calcium: 8.8 mg/dL — ABNORMAL LOW (ref 8.9–10.3)
Chloride: 107 mmol/L (ref 98–111)
Creatinine, Ser: 0.68 mg/dL (ref 0.44–1.00)
GFR, Estimated: >60 mL/min (ref >60)
Glucose, Bld: 97 mg/dL (ref 70–99)
Potassium: 3.9 mmol/L (ref 3.5–5.1)
Sodium: 138 mmol/L (ref 135–145)

## 2023-01-04 LAB — MAGNESIUM: Magnesium: 2 mg/dL (ref 1.7–2.4)

## 2023-01-04 LAB — CBC WITH DIFFERENTIAL/PLATELET
Abs Immature Granulocytes: 0.06 10*3/uL (ref 0.00–0.07)
Basophils Absolute: 0 10*3/uL (ref 0.0–0.1)
Basophils Relative: 0 %
Eosinophils Absolute: 0.3 10*3/uL (ref 0.0–0.5)
Eosinophils Relative: 2 %
HCT: 31.3 % — ABNORMAL LOW (ref 36.0–46.0)
Hemoglobin: 10.3 g/dL — ABNORMAL LOW (ref 12.0–15.0)
Immature Granulocytes: 1 %
Lymphocytes Relative: 14 %
Lymphs Abs: 1.7 10*3/uL (ref 0.7–4.0)
MCH: 31.4 pg (ref 26.0–34.0)
MCHC: 32.9 g/dL (ref 30.0–36.0)
MCV: 95.4 fL (ref 80.0–100.0)
Monocytes Absolute: 1.4 10*3/uL — ABNORMAL HIGH (ref 0.1–1.0)
Monocytes Relative: 12 %
Neutro Abs: 8.8 10*3/uL — ABNORMAL HIGH (ref 1.7–7.7)
Neutrophils Relative %: 71 %
Platelets: 155 10*3/uL (ref 150–400)
RBC: 3.28 MIL/uL — ABNORMAL LOW (ref 3.87–5.11)
RDW: 13.7 % (ref 11.5–15.5)
WBC: 12.3 10*3/uL — ABNORMAL HIGH (ref 4.0–10.5)
nRBC: 0 % (ref 0.0–0.2)

## 2023-01-04 MED ORDER — MECLIZINE HCL 25 MG PO TABS: 25.0000 mg | ORAL_TABLET | Freq: Three times a day (TID) | ORAL | 0 refills | Status: DC | PRN

## 2023-01-04 NOTE — Progress Notes (Signed)
   01/04/23 1034  Orthostatic Lying   BP- Lying 123/61  Pulse- Lying 80  Orthostatic Sitting  BP- Sitting 122/68  Pulse- Sitting 83  Orthostatic Standing at 0 minutes  BP- Standing at 0 minutes 113/64  Pulse- Standing at 0 minutes 81   Pt denies any dizziness or headache at this time.

## 2023-01-04 NOTE — Discharge Summary (Signed)
Physician Discharge Summary  Sara Mcmahon UEA:[id number] DOB: 08/04/1961 DOA: 01/02/2023  PCP: Elfredia Nevins, MD  Admit date: 01/02/2023  Discharge date: 01/04/2023  Admitted From:Home  Disposition:  Home  Recommendations for Outpatient Follow-up:  Follow up with PCP in 1-2 weeks Please obtain BMP/CBC in one week Continue on meclizine as needed for dizziness Continue other home medications as prior  Home Health: None  Equipment/Devices: None  Discharge Condition:Stable  CODE STATUS: Full  Diet recommendation: Heart Healthy  Brief/Interim Summary:  Sara Mcmahon is a 62 y.o. female with medical history significant for Takotsubo cardiomyopathy/NSTEMI, CHF, hypertension, dyslipidemia, anxiety disorder, and GERD who states that she suddenly began to get dizzy 3-4 days ago when she got out of bed and had difficulty with walking.  She was admitted with SIRS criteria with leukocytosis and ongoing fever of unknown origin.  CT of the head with no acute findings noted and brain MRI eventually performed with no acute findings either.  Blood cultures with no growth noted and procalcitonin was low and leukocytosis has been downward trending with no further fevers noted.  It appears that patient may have had some reactive leukocytosis in the setting of her symptoms which may have been attributed to vertigo with possible viral labyrinthitis.  She was also noted to be quite dehydrated which was likely contributing to her symptoms.    She is in stable condition for discharge today and is much more stable with ambulation and has no other acute complaints or concerns noted.  I have recommended repeat CBC in approximately 1 week to ensure that her leukocytosis has resolved.  No other acute events or concerns noted.  Discharge Diagnoses:  Principal Problem:   SIRS (systemic inflammatory response syndrome) (HCC) Active Problems:   Essential hypertension, benign   Takotsubo cardiomyopathy   HLD  (hyperlipidemia)   NSTEMI (non-ST elevated myocardial infarction) [facility])  Principal discharge diagnosis: Dizziness in the setting of vertigo with possible viral labyrinthitis and/or orthostasis.  Discharge Instructions  Discharge Instructions     Diet - low sodium heart healthy   Complete by: As directed    Increase activity slowly   Complete by: As directed       Allergies as of 01/04/2023       Reactions   Omeprazole Swelling   Pollen Extract    Trees, grass, flowers        Medication List     TAKE these medications    ALPRAZolam 0.5 MG tablet Commonly known as: XANAX Take 1 tablet (0.5 mg total) by mouth at bedtime as needed for anxiety.   ASHWAGANDHA PO Take by mouth.   aspirin EC 81 MG tablet Take 1 tablet (81 mg total) by mouth daily. Swallow whole.   carvedilol 3.125 MG tablet Commonly known as: COREG Take 1 tablet (3.125 mg total) by mouth 2 (two) times daily with a meal.   cholecalciferol 1000 units tablet Commonly known as: VITAMIN D Take 2,000 Units by mouth daily.   fluticasone 50 MCG/ACT nasal spray Commonly known as: FLONASE Place 1 spray into both nostrils daily.   GINGER ROOT PO Take 1 tablet by mouth daily.   meclizine 25 MG tablet Commonly known as: ANTIVERT Take 1 tablet (25 mg total) by mouth 3 (three) times daily as needed for dizziness.   montelukast 10 MG tablet Commonly known as: SINGULAIR Take 1 tablet (10 mg total) by mouth every other day.   nitroGLYCERIN 0.4 MG SL tablet Commonly known as: NITROSTAT Place  1 tablet (0.4 mg total) under the tongue every 5 (five) minutes as needed for chest pain.   NON FORMULARY Plexus   potassium chloride 10 MEQ CR capsule Commonly known as: MICRO-K TAKE 1 CAPSULE(10 MEQ) BY MOUTH DAILY   QC TUMERIC COMPLEX PO Take 1 tablet by mouth daily.        Follow-up Information     Elfredia Nevins, MD. Schedule an appointment as soon as possible for a visit in 1 week(s).   Specialty:  Internal Medicine Contact information: [address] [place] [address] [phone]                Allergies  Allergen Reactions   Omeprazole Swelling   Pollen Extract     Trees, grass, flowers    Consultations: None   Procedures/Studies: MR BRAIN WO CONTRAST  Result Date: 01/04/2023 CLINICAL DATA:  Dizziness with possible orthostasis/vertigo EXAM: MRI HEAD WITHOUT CONTRAST TECHNIQUE: Multiplanar, multiecho pulse sequences of the brain and surrounding structures were obtained without intravenous contrast. COMPARISON:  CT head 1 day prior FINDINGS: Brain: There is no acute intracranial hemorrhage, extra-axial fluid collection, or acute infarct. Parenchymal volume is normal. The ventricles are normal in size. Scattered foci of FLAIR signal abnormality in the supratentorial white matter likely reflects sequela of mild chronic small-vessel ischemic change. The pituitary and suprasellar region are normal. There is no mass lesion. There is no mass effect or midline shift. Vascular: Normal flow voids. Skull and upper cervical spine: Normal marrow signal. Sinuses/Orbits: The paranasal sinuses are clear. The globes and orbits are unremarkable. Other: A nasal septal perforation is noted. IMPRESSION: 1. No acute intracranial pathology. Mild chronic small-vessel ischemic change. 2. Perforated nasal septum. Electronically Signed   By: Lesia Hausen M.D.   On: 01/04/2023 08:30   CT HEAD WO CONTRAST ( )  Result Date: 01/03/2023 CLINICAL DATA:  62 year old female meeting criteria for sepsis. Neurologic deficit. EXAM: CT HEAD WITHOUT CONTRAST TECHNIQUE: Contiguous axial images were obtained from the base of the skull through the vertex without intravenous contrast. RADIATION DOSE REDUCTION: This exam was performed according to the departmental dose-optimization program which includes automated exposure control, adjustment of the mA and/or kV according to patient size and/or use of  iterative reconstruction technique. COMPARISON:  Head CT 07/16/2018. FINDINGS: Brain: Cerebral volume remains normal for age. No midline shift, ventriculomegaly, mass effect, evidence of mass lesion, intracranial hemorrhage or evidence of cortically based acute infarction. Gray-white matter differentiation is within normal limits throughout the brain. No encephalomalacia identified. Vascular: Calcified atherosclerosis at the skull base. No suspicious intracranial vascular hyperdensity. Skull: No acute osseous abnormality identified. Sinuses/Orbits: Only trace paranasal sinus mucosal thickening. No sinus fluid levels identified, tympanic cavities and mastoids appear well aerated. Other: Visualized orbits and scalp soft tissues are within normal limits. IMPRESSION: Normal for age non-contrast CT appearance of the brain. Electronically Signed   By: Odessa Fleming M.D.   On: 01/03/2023 09:29   DG Chest Portable 1 View  Result Date: 01/02/2023 CLINICAL DATA:  62 year old female with cough. EXAM: PORTABLE CHEST 1 VIEW COMPARISON:  Portable chest 01/12/2021 and earlier. FINDINGS: Portable AP upright view at 0648 hours. Mildly lower lung volumes. Mediastinal contours remain normal. Visualized tracheal air column is within normal limits. Allowing for portable technique the lungs are clear. No pneumothorax or pleural effusion. Negative visible bowel gas. No acute osseous abnormality identified. IMPRESSION: Negative portable chest. Electronically Signed   By: Odessa Fleming M.D.   On: 01/02/2023 06:59  Discharge Exam: Vitals:   01/03/23 2047 01/04/23 0427  BP: 130/84 123/82  Pulse: 86 78  Resp: 18 18  Temp: (!) 97.4 F (36.3 C) 97.6 F (36.4 C)  SpO2: 100% 98%   Vitals:   01/03/23 1310 01/03/23 1659 01/03/23 2047 01/04/23 0427  BP: 125/83  130/84 123/82  Pulse: (!) 101  86 78  Resp:   18 18  Temp: 99.4 F (37.4 C) 99.3 F (37.4 C) (!) 97.4 F (36.3 C) 97.6 F (36.4 C)  TempSrc: Oral Oral    SpO2: 99%  100%  98%  Weight:      Height:        General: Pt is alert, awake, not in acute distress Cardiovascular: RRR, S1/S2 +, no rubs, no gallops Respiratory: CTA bilaterally, no wheezing, no rhonchi Abdominal: Soft, NT, ND, bowel sounds + Extremities: no edema, no cyanosis    The results of significant diagnostics from this hospitalization (including imaging, microbiology, ancillary and laboratory) are listed below for reference.     Microbiology: Recent Results (from the past 240 hour(s))  Resp panel by RT-PCR (RSV, Flu A&B, Covid) Anterior Nasal Swab     Status: None   Collection Time: 01/02/23  6:34 AM   Specimen: Anterior Nasal Swab  Result Value Ref Range Status   SARS Coronavirus 2 by RT PCR NEGATIVE NEGATIVE Final    Comment: (NOTE) SARS-CoV-2 target nucleic acids are NOT DETECTED.  The SARS-CoV-2 RNA is generally detectable in upper respiratory specimens during the acute phase of infection. The lowest concentration of SARS-CoV-2 viral copies this assay can detect is 138 copies/mL. A negative result does not preclude SARS-Cov-2 infection and should not be used as the sole basis for treatment or other patient management decisions. A negative result may occur with  improper specimen collection/handling, submission of specimen other than nasopharyngeal swab, presence of viral mutation(s) within the areas targeted by this assay, and inadequate number of viral copies(<138 copies/mL). A negative result must be combined with clinical observations, patient history, and epidemiological information. The expected result is Negative.  Fact Sheet for Patients:  EntrepreneurPulse.com.au  Fact Sheet for Healthcare Providers:  IncredibleEmployment.be  This test is no t yet approved or cleared by the [place] FDA and  has been authorized for detection and/or diagnosis of SARS-CoV-2 by FDA under an Emergency Use Authorization (EUA). This EUA will  remain  in effect (meaning this test can be used) for the duration of the COVID-19 declaration under Section 564(b)(1) of the Act, 21 U.S.C.section 360bbb-3(b)(1), unless the authorization is terminated  or revoked sooner.       Influenza A by PCR NEGATIVE NEGATIVE Final   Influenza B by PCR NEGATIVE NEGATIVE Final    Comment: (NOTE) The Xpert Xpress SARS-CoV-2/FLU/RSV plus assay is intended as an aid in the diagnosis of influenza from Nasopharyngeal swab specimens and should not be used as a sole basis for treatment. Nasal washings and aspirates are unacceptable for Xpert Xpress SARS-CoV-2/FLU/RSV testing.  Fact Sheet for Patients: EntrepreneurPulse.com.au  Fact Sheet for Healthcare Providers: IncredibleEmployment.be  This test is not yet approved or cleared by the [place] FDA and has been authorized for detection and/or diagnosis of SARS-CoV-2 by FDA under an Emergency Use Authorization (EUA). This EUA will remain in effect (meaning this test can be used) for the duration of the COVID-19 declaration under Section 564(b)(1) of the Act, 21 U.S.C. section 360bbb-3(b)(1), unless the authorization is terminated or revoked.     Resp Syncytial Virus  by PCR NEGATIVE NEGATIVE Final    Comment: (NOTE) Fact Sheet for Patients: BloggerCourse.comhttps://www.fda.gov/media/152166/download  Fact Sheet for Healthcare Providers: SeriousBroker.ithttps://www.fda.gov/media/152162/download  This test is not yet approved or cleared by the [place] FDA and has been authorized for detection and/or diagnosis of SARS-CoV-2 by FDA under an Emergency Use Authorization (EUA). This EUA will remain in effect (meaning this test can be used) for the duration of the COVID-19 declaration under Section 564(b)(1) of the Act, 21 U.S.C. section 360bbb-3(b)(1), unless the authorization is terminated or revoked.  Performed at [facility], [address] [place], [place] [address]   Blood  Culture (routine x 2)     Status: None (Preliminary result)   Collection Time: 01/02/23  7:06 AM   Specimen: Left Antecubital; Blood  Result Value Ref Range Status   Specimen Description   Final    LEFT ANTECUBITAL BOTTLES DRAWN AEROBIC AND ANAEROBIC   Special Requests   Final    Blood Culture results may not be optimal due to an excessive volume of blood received in culture bottles   Culture   Final    NO GROWTH 2 DAYS Performed at [facility], [address] [place], [place] [address]    Report Status PENDING  Incomplete  Blood Culture (routine x 2)     Status: None (Preliminary result)   Collection Time: 01/02/23  7:11 AM   Specimen: BLOOD RIGHT HAND  Result Value Ref Range Status   Specimen Description BLOOD RIGHT HAND BOTTLES DRAWN AEROBIC ONLY  Final   Special Requests Blood Culture adequate volume  Final   Culture   Final    NO GROWTH 2 DAYS Performed at [facility], [address] [place], [place] [address]    Report Status PENDING  Incomplete     Labs: BNP (last 3 results) No results for input(s): "BNP" in the last 8760 hours. Basic Metabolic Panel: Recent Labs  Lab 12/30/22 0054 01/02/23 0653 01/03/23 0346 01/04/23 0450  NA 138 134* 138 138  K 3.6 3.6 3.0* 3.9  CL 103 101 107 107  CO2 27 25 24 24   GLUCOSE 105* 149* 102* 97  BUN 17 17 10 11   CREATININE 0.77 0.84 0.75 0.68  CALCIUM 9.4 9.1 8.4* 8.8*  MG  --   --  1.7 2.0   Liver Function Tests: Recent Labs  Lab 01/02/23 0653  AST 23  ALT 18  ALKPHOS 53  BILITOT 0.6  PROT 7.0  ALBUMIN 3.8   No results for input(s): "LIPASE", "AMYLASE" in the last 168 hours. No results for input(s): "AMMONIA" in the last 168 hours. CBC: Recent Labs  Lab 12/30/22 0054 01/02/23 0654 01/03/23 0346 01/04/23 0450  WBC 8.5 19.1* 16.9* 12.3*  NEUTROABS 5.1  --   --  8.8*  HGB 12.1 12.4 10.2* 10.3*  HCT 36.4 36.8 31.2* 31.3*  MCV 94.3 93.6 95.1 95.4  PLT 238 205 166 155   Cardiac Enzymes: No results for  input(s): "CKTOTAL", "CKMB", "CKMBINDEX", "TROPONINI" in the last 168 hours. BNP: Invalid input(s): "POCBNP" CBG: Recent Labs  Lab 01/02/23 0631  GLUCAP 124*   D-Dimer No results for input(s): "DDIMER" in the last 72 hours. Hgb A1c No results for input(s): "HGBA1C" in the last 72 hours. Lipid Profile No results for input(s): "CHOL", "HDL", "LDLCALC", "TRIG", "CHOLHDL", "LDLDIRECT" in the last 72 hours. Thyroid function studies No results for input(s): "TSH", "T4TOTAL", "T3FREE", "THYROIDAB" in the last 72 hours.  Invalid input(s): "FREET3" Anemia work up [facility]  01/03/23 0346 01/03/23 0735  VITAMINB12  --  265  FOLATE  --  34.0  FERRITIN  --  108  TIBC  --  222*  IRON  --  11*  RETICCTPCT 1.5  --    Urinalysis    Component Value Date/Time   COLORURINE YELLOW 01/02/2023 0628   APPEARANCEUR CLEAR 01/02/2023 0628   LABSPEC 1.024 01/02/2023 0628   PHURINE 6.0 01/02/2023 0628   GLUCOSEU NEGATIVE 01/02/2023 0628   HGBUR SMALL (A) 01/02/2023 0628   BILIRUBINUR NEGATIVE 01/02/2023 0628   KETONESUR 20 (A) 01/02/2023 0628   PROTEINUR 30 (A) 01/02/2023 0628   UROBILINOGEN 0.2 09/11/2014 1801   NITRITE NEGATIVE 01/02/2023 0628   LEUKOCYTESUR TRACE (A) 01/02/2023 0628   Sepsis Labs Recent Labs  Lab 12/30/22 0054 01/02/23 0654 01/03/23 0346 01/04/23 0450  WBC 8.5 19.1* 16.9* 12.3*   Microbiology Recent Results (from the past 240 hour(s))  Resp panel by RT-PCR (RSV, Flu A&B, Covid) Anterior Nasal Swab     Status: None   Collection Time: 01/02/23  6:34 AM   Specimen: Anterior Nasal Swab  Result Value Ref Range Status   SARS Coronavirus 2 by RT PCR NEGATIVE NEGATIVE Final    Comment: (NOTE) SARS-CoV-2 target nucleic acids are NOT DETECTED.  The SARS-CoV-2 RNA is generally detectable in upper respiratory specimens during the acute phase of infection. The lowest concentration of SARS-CoV-2 viral copies this assay can detect is 138 copies/mL. A negative result  does not preclude SARS-Cov-2 infection and should not be used as the sole basis for treatment or other patient management decisions. A negative result may occur with  improper specimen collection/handling, submission of specimen other than nasopharyngeal swab, presence of viral mutation(s) within the areas targeted by this assay, and inadequate number of viral copies(<138 copies/mL). A negative result must be combined with clinical observations, patient history, and epidemiological information. The expected result is Negative.  Fact Sheet for Patients:  EntrepreneurPulse.com.au  Fact Sheet for Healthcare Providers:  IncredibleEmployment.be  This test is no t yet approved or cleared by the [place] FDA and  has been authorized for detection and/or diagnosis of SARS-CoV-2 by FDA under an Emergency Use Authorization (EUA). This EUA will remain  in effect (meaning this test can be used) for the duration of the COVID-19 declaration under Section 564(b)(1) of the Act, 21 U.S.C.section 360bbb-3(b)(1), unless the authorization is terminated  or revoked sooner.       Influenza A by PCR NEGATIVE NEGATIVE Final   Influenza B by PCR NEGATIVE NEGATIVE Final    Comment: (NOTE) The Xpert Xpress SARS-CoV-2/FLU/RSV plus assay is intended as an aid in the diagnosis of influenza from Nasopharyngeal swab specimens and should not be used as a sole basis for treatment. Nasal washings and aspirates are unacceptable for Xpert Xpress SARS-CoV-2/FLU/RSV testing.  Fact Sheet for Patients: EntrepreneurPulse.com.au  Fact Sheet for Healthcare Providers: IncredibleEmployment.be  This test is not yet approved or cleared by the [place] FDA and has been authorized for detection and/or diagnosis of SARS-CoV-2 by FDA under an Emergency Use Authorization (EUA). This EUA will remain in effect (meaning this test can be used) for  the duration of the COVID-19 declaration under Section 564(b)(1) of the Act, 21 U.S.C. section 360bbb-3(b)(1), unless the authorization is terminated or revoked.     Resp Syncytial Virus by PCR NEGATIVE NEGATIVE Final    Comment: (NOTE) Fact Sheet for Patients: EntrepreneurPulse.com.au  Fact Sheet for Healthcare Providers: IncredibleEmployment.be  This test is not  yet approved or cleared by the [place] and has been authorized for detection and/or diagnosis of SARS-CoV-2 by FDA under an Emergency Use Authorization (EUA). This EUA will remain in effect (meaning this test can be used) for the duration of the COVID-19 declaration under Section 564(b)(1) of the Act, 21 U.S.C. section 360bbb-3(b)(1), unless the authorization is terminated or revoked.  Performed at [facility], [address], [place], [place] [address]   Blood Culture (routine x 2)     Status: None (Preliminary result)   Collection Time: 01/02/23  7:06 AM   Specimen: Left Antecubital; Blood  Result Value Ref Range Status   Specimen Description   Final    LEFT ANTECUBITAL BOTTLES DRAWN AEROBIC AND ANAEROBIC   Special Requests   Final    Blood Culture results may not be optimal due to an excessive volume of blood received in culture bottles   Culture   Final    NO GROWTH 2 DAYS Performed at [facility], [address] [place], [place] [address]    Report Status PENDING  Incomplete  Blood Culture (routine x 2)     Status: None (Preliminary result)   Collection Time: 01/02/23  7:11 AM   Specimen: BLOOD RIGHT HAND  Result Value Ref Range Status   Specimen Description BLOOD RIGHT HAND BOTTLES DRAWN AEROBIC ONLY  Final   Special Requests Blood Culture adequate volume  Final   Culture   Final    NO GROWTH 2 DAYS Performed at [facility], [address], [place], [place] [address]    Report Status PENDING  Incomplete     Time coordinating discharge: 35  minutes  SIGNED:   Erick Blinks, DO [facility] 01/04/2023, 10:15 AM  If 7PM-7AM, please contact night-coverage [email or web address]

## 2023-01-07 LAB — CULTURE, BLOOD (ROUTINE X 2)
Culture: NO GROWTH
Culture: NO GROWTH
Special Requests: ADEQUATE

## 2023-01-13 DIAGNOSIS — I10 Essential (primary) hypertension: Secondary | ICD-10-CM | POA: Diagnosis not present

## 2023-01-13 DIAGNOSIS — Z6823 Body mass index (BMI) 23.0-23.9, adult: Secondary | ICD-10-CM | POA: Diagnosis not present

## 2023-01-13 DIAGNOSIS — H8110 Benign paroxysmal vertigo, unspecified ear: Secondary | ICD-10-CM | POA: Diagnosis not present

## 2023-01-13 DIAGNOSIS — R6510 Systemic inflammatory response syndrome (SIRS) of non-infectious origin without acute organ dysfunction: Secondary | ICD-10-CM | POA: Diagnosis not present

## 2023-01-14 ENCOUNTER — Emergency Department (HOSPITAL_COMMUNITY)
Admission: EM | Admit: 2023-01-14 | Discharge: 2023-01-14 | Disposition: A | Discharge status: Home / Self Care | Payer: Medicare Other | Attending: Emergency Medicine | Admitting: Emergency Medicine

## 2023-01-14 ENCOUNTER — Emergency Department (HOSPITAL_COMMUNITY): Payer: Medicare Other

## 2023-01-14 ENCOUNTER — Other Ambulatory Visit: Payer: Self-pay

## 2023-01-14 ENCOUNTER — Encounter (HOSPITAL_COMMUNITY): Payer: Self-pay | Admitting: *Deleted

## 2023-01-14 DIAGNOSIS — R509 Fever, unspecified: Secondary | ICD-10-CM | POA: Diagnosis not present

## 2023-01-14 DIAGNOSIS — Z1152 Encounter for screening for COVID-19: Secondary | ICD-10-CM | POA: Diagnosis not present

## 2023-01-14 DIAGNOSIS — R3915 Urgency of urination: Secondary | ICD-10-CM | POA: Insufficient documentation

## 2023-01-14 DIAGNOSIS — Z7982 Long term (current) use of aspirin: Secondary | ICD-10-CM | POA: Insufficient documentation

## 2023-01-14 DIAGNOSIS — R5381 Other malaise: Secondary | ICD-10-CM

## 2023-01-14 DIAGNOSIS — D72829 Elevated white blood cell count, unspecified: Secondary | ICD-10-CM | POA: Diagnosis not present

## 2023-01-14 DIAGNOSIS — R42 Dizziness and giddiness: Secondary | ICD-10-CM | POA: Diagnosis not present

## 2023-01-14 DIAGNOSIS — R5383 Other fatigue: Secondary | ICD-10-CM | POA: Diagnosis not present

## 2023-01-14 DIAGNOSIS — R519 Headache, unspecified: Secondary | ICD-10-CM | POA: Diagnosis not present

## 2023-01-14 DIAGNOSIS — A419 Sepsis, unspecified organism: Secondary | ICD-10-CM | POA: Diagnosis not present

## 2023-01-14 LAB — URINALYSIS, ROUTINE W REFLEX MICROSCOPIC
Bacteria, UA: NONE SEEN
Bilirubin Urine: NEGATIVE
Glucose, UA: NEGATIVE mg/dL
Ketones, ur: NEGATIVE mg/dL
Leukocytes,Ua: NEGATIVE
Nitrite: NEGATIVE
Protein, ur: NEGATIVE mg/dL
Specific Gravity, Urine: 1.003 — ABNORMAL LOW (ref 1.005–1.030)
pH: 8 (ref 5.0–8.0)

## 2023-01-14 LAB — CBC WITH DIFFERENTIAL/PLATELET
Abs Immature Granulocytes: 0.1 10*3/uL — ABNORMAL HIGH (ref 0.00–0.07)
Basophils Absolute: 0.1 10*3/uL (ref 0.0–0.1)
Basophils Relative: 0 %
Eosinophils Absolute: 0.1 10*3/uL (ref 0.0–0.5)
Eosinophils Relative: 1 %
HCT: 35.8 % — ABNORMAL LOW (ref 36.0–46.0)
Hemoglobin: 11.8 g/dL — ABNORMAL LOW (ref 12.0–15.0)
Immature Granulocytes: 1 %
Lymphocytes Relative: 7 %
Lymphs Abs: 1.4 10*3/uL (ref 0.7–4.0)
MCH: 31.2 pg (ref 26.0–34.0)
MCHC: 33 g/dL (ref 30.0–36.0)
MCV: 94.7 fL (ref 80.0–100.0)
Monocytes Absolute: 1.2 10*3/uL — ABNORMAL HIGH (ref 0.1–1.0)
Monocytes Relative: 6 %
Neutro Abs: 16.7 10*3/uL — ABNORMAL HIGH (ref 1.7–7.7)
Neutrophils Relative %: 85 %
Platelets: 326 10*3/uL (ref 150–400)
RBC: 3.78 MIL/uL — ABNORMAL LOW (ref 3.87–5.11)
RDW: 13.3 % (ref 11.5–15.5)
WBC: 19.6 10*3/uL — ABNORMAL HIGH (ref 4.0–10.5)
nRBC: 0 % (ref 0.0–0.2)

## 2023-01-14 LAB — RESP PANEL BY RT-PCR (RSV, FLU A&B, COVID)  RVPGX2
Influenza A by PCR: NEGATIVE
Influenza B by PCR: NEGATIVE
Resp Syncytial Virus by PCR: NEGATIVE
SARS Coronavirus 2 by RT PCR: NEGATIVE

## 2023-01-14 LAB — BLOOD GAS, VENOUS
Acid-Base Excess: 7.7 mmol/L — ABNORMAL HIGH (ref 0.0–2.0)
Bicarbonate: 31 mmol/L — ABNORMAL HIGH (ref 20.0–28.0)
Drawn by: 7012
O2 Saturation: 64.5 %
Patient temperature: 37.1
pCO2, Ven: 38 mmHg — ABNORMAL LOW (ref 44–60)
pH, Ven: 7.52 — ABNORMAL HIGH (ref 7.25–7.43)
pO2, Ven: 36 mmHg (ref 32–45)

## 2023-01-14 LAB — COMPREHENSIVE METABOLIC PANEL
ALT: 16 U/L (ref 0–44)
AST: 20 U/L (ref 15–41)
Albumin: 3.9 g/dL (ref 3.5–5.0)
Alkaline Phosphatase: 63 U/L (ref 38–126)
Anion gap: 10 (ref 5–15)
BUN: 12 mg/dL (ref 8–23)
CO2: 26 mmol/L (ref 22–32)
Calcium: 9.2 mg/dL (ref 8.9–10.3)
Chloride: 98 mmol/L (ref 98–111)
Creatinine, Ser: 0.77 mg/dL (ref 0.44–1.00)
GFR, Estimated: >60 mL/min (ref >60)
Glucose, Bld: 115 mg/dL — ABNORMAL HIGH (ref 70–99)
Potassium: 3.5 mmol/L (ref 3.5–5.1)
Sodium: 134 mmol/L — ABNORMAL LOW (ref 135–145)
Total Bilirubin: 0.4 mg/dL (ref 0.3–1.2)
Total Protein: 7.5 g/dL (ref 6.5–8.1)

## 2023-01-14 LAB — TSH: TSH: 1.284 u[IU]/mL (ref 0.350–4.500)

## 2023-01-14 LAB — LACTIC ACID, PLASMA: Lactic Acid, Venous: 0.8 mmol/L (ref 0.5–1.9)

## 2023-01-14 MED ORDER — ACETAMINOPHEN 325 MG PO TABS
650.0000 mg | ORAL_TABLET | Freq: Once | ORAL | Status: AC
  Administered 2023-01-14: 650 mg via ORAL
  Filled 2023-01-14: qty 2

## 2023-01-14 NOTE — ED Notes (Signed)
Lab and pharmacy at bedside

## 2023-01-14 NOTE — Progress Notes (Signed)
She reports cough on my evaluation

## 2023-01-14 NOTE — ED Notes (Signed)
EDPA Provider at bedside. 

## 2023-01-14 NOTE — ED Triage Notes (Signed)
Pt is here for continued headache and body aches and frequent urination.  This all began 2 weeks ago.

## 2023-01-14 NOTE — Discharge Instructions (Signed)
You were seen in the emergency department for a headache and general malaise. Unfortunately, we couldn't find a clear source of infection causing your white blood count to be elevated. After discussion with the hospitalist, the recommendation was to follow up with your primary care provider for follow up. However, if you feel that your symptoms are worsening, please do not hesitate to return to the emergency department for further testing. We are waiting for results on your blood culture and urine culture so if results are abnormal there, you will receive a call with those results with instructions.

## 2023-01-14 NOTE — ED Provider Notes (Signed)
[facility] Provider Note   CSN: [id number] Arrival date & time: 01/14/23  1904     History Chief Complaint  Patient presents with   Headache    Sara Mcmahon is a 62 y.o. female. Patient presented to the ED concerned about headaches and urinary urgency. History of Takotsubo cardiomyopathy, hyperlipidemia, and GERD. Patient recently seen in ED on 01/02/2023 and admitted for vertigo, SIRS criteria, and dehydration. Patient states that headaches have been present for 2 weeks with fluctuations in severity. Denies vision changes, nausea, abdominal pain, vomiting, or diarrhea. States that after discharge from hospital, she was improving but started to feel generally unwell again a few days ago. Difficult to determine timing of when urinary urgency increased but patient believes this has been present for several days to 2 weeks now. Denies history of chronic or frequent UTIs. Denies hematuria, dysuria, or urethral/vaginal discharge.  No obvious sick contacts but remarked that she was around grandchildren recently but does not believe they were sick.   Headache Associated symptoms: fever   Associated symptoms: no abdominal pain, no cough, no diarrhea, no nausea and no vomiting        Home Medications Prior to Admission medications   Medication Sig Start Date End Date Taking? Authorizing Provider  ALPRAZolam Duanne Moron) 0.5 MG tablet Take 1 tablet (0.5 mg total) by mouth at bedtime as needed for anxiety. 05/27/21  Yes Gosrani, Nimish C, MD  amoxicillin-clavulanate (AUGMENTIN) 875-125 MG tablet Take 1 tablet by mouth 2 (two) times daily. 10 day supply 01/13/23  Yes [provider]  ASHWAGANDHA PO Take by mouth.   Yes [provider]  aspirin EC 81 MG EC tablet Take 1 tablet (81 mg total) by mouth daily. Swallow whole. 01/14/21  Yes Bhagat, Bhavinkumar, PA  benzonatate (TESSALON) 200 MG capsule Take 200 mg by mouth 3 (three) times daily.  01/13/23  Yes [provider]  carvedilol (COREG) 3.125 MG tablet Take 1 tablet (3.125 mg total) by mouth 2 (two) times daily with a meal. 07/08/22  Yes Jerline Pain, MD  cholecalciferol (VITAMIN D) 1000 units tablet Take 2,000 Units by mouth daily.   Yes [provider]  fluticasone (FLONASE) 50 MCG/ACT nasal spray Place 1 spray into both nostrils daily. 04/16/21  Yes [provider]  Ginger, Zingiber officinalis, (GINGER ROOT PO) Take 1 tablet by mouth daily.   Yes [provider]  LYSINE HCL PO Take 1 tablet by mouth daily.   Yes [provider]  meclizine (ANTIVERT) 25 MG tablet Take 1 tablet (25 mg total) by mouth 3 (three) times daily as needed for dizziness. 01/04/23  Yes Shah, Pratik D, DO  montelukast (SINGULAIR) 10 MG tablet Take 1 tablet (10 mg total) by mouth every other day. 05/22/21  Yes Ailene Ards, NP  nitroGLYCERIN (NITROSTAT) 0.4 MG SL tablet Place 1 tablet (0.4 mg total) under the tongue every 5 (five) minutes as needed for chest pain. 07/08/22  Yes Jerline Pain, MD  potassium chloride (MICRO-K) 10 MEQ CR capsule TAKE 1 CAPSULE(10 MEQ) BY MOUTH DAILY 07/08/22  Yes Jerline Pain, MD  Turmeric (QC TUMERIC COMPLEX PO) Take 1 tablet by mouth daily.   Yes [provider]  NON FORMULARY Plexus Patient not taking: Reported on 01/02/2023    [provider]      Allergies    Omeprazole and Pollen extract    Review of Systems   Review of Systems  Constitutional:  Positive for chills and fever. Negative for appetite change.  Respiratory:  Negative for cough and chest tightness.   Cardiovascular:  Negative for chest pain.  Gastrointestinal:  Negative for abdominal pain, diarrhea, nausea and vomiting.  Genitourinary:  Positive for urgency. Negative for dysuria and flank pain.  Neurological:  Positive for headaches.  All other systems reviewed and are negative.   Physical Exam Updated Vital Signs BP 121/64   Pulse 90    Temp 98.8 F (37.1 C) (Oral)   Resp 14   SpO2 99%  Physical Exam Vitals and nursing note reviewed.  Constitutional:      Appearance: She is well-developed.  HENT:     Head: Normocephalic and atraumatic.  Cardiovascular:     Rate and Rhythm: Normal rate and regular rhythm.     Heart sounds: Normal heart sounds. No murmur heard. Pulmonary:     Effort: Pulmonary effort is normal. No respiratory distress.     Breath sounds: Normal breath sounds.  Abdominal:     Palpations: Abdomen is soft.  Musculoskeletal:     Cervical back: Normal range of motion. No rigidity.  Skin:    General: Skin is warm and dry.     Capillary Refill: Capillary refill takes less than 2 seconds.  Neurological:     Mental Status: She is alert.     Cranial Nerves: No facial asymmetry.     Sensory: No sensory deficit.     Motor: No weakness.     Deep Tendon Reflexes: Reflexes normal.  Psychiatric:        Mood and Affect: Mood normal. Mood is not anxious.        Speech: Speech normal.        Behavior: Behavior is not agitated.     ED Results / Procedures / Treatments   Labs (all labs ordered are listed, but only abnormal results are displayed) Labs Reviewed  URINALYSIS, ROUTINE W REFLEX MICROSCOPIC - Abnormal; Notable for the following components:      Result Value   Color, Urine COLORLESS (*)    Specific Gravity, Urine 1.003 (*)    Hgb urine dipstick SMALL (*)    All other components within normal limits  CBC WITH DIFFERENTIAL/PLATELET - Abnormal; Notable for the following components:   WBC 19.6 (*)    RBC 3.78 (*)    Hemoglobin 11.8 (*)    HCT 35.8 (*)    Neutro Abs 16.7 (*)    Monocytes Absolute 1.2 (*)    Abs Immature Granulocytes 0.10 (*)    All other components within normal limits  COMPREHENSIVE METABOLIC PANEL - Abnormal; Notable for the following components:   Sodium 134 (*)    Glucose, Bld 115 (*)    All other components within normal limits  BLOOD GAS, VENOUS - Abnormal; Notable for  the following components:   pH, Ven 7.52 (*)    pCO2, Ven 38 (*)    Bicarbonate 31.0 (*)    Acid-Base Excess 7.7 (*)    All other components within normal limits  RESP PANEL BY RT-PCR (RSV, FLU A&B, COVID)  RVPGX2  CULTURE, BLOOD (ROUTINE X 2)  CULTURE, BLOOD (ROUTINE X 2)  URINE CULTURE  TSH  LACTIC ACID, PLASMA  T4, FREE    EKG EKG Interpretation  Date/Time:  Thursday January 14 2023 21:17:43 EST Ventricular Rate:  90 PR Interval:  187 QRS Duration: 89 QT Interval:  365 QTC Calculation: 447 R Axis:   82 Text Interpretation:  Sinus rhythm Borderline right axis deviation Borderline T abnormalities, anterior leads No significant change since prior 1/24 Confirmed by Aletta Edouard [phone]) on 01/14/2023 9:22:46 PM  Radiology DG Chest Port 1 View  Result Date: 01/14/2023 CLINICAL DATA:  Headache, body aches, polyuria, sepsis EXAM: PORTABLE CHEST 1 VIEW COMPARISON:  01/02/2023 FINDINGS: Single frontal view of the chest demonstrate an unremarkable cardiac silhouette. No airspace disease, effusion, or pneumothorax. No acute bony abnormality. IMPRESSION: 1. No acute intrathoracic process. Electronically Signed   By: Randa Ngo M.D.   On: 01/14/2023 22:00    Procedures Procedures   Medications Ordered in ED Medications  acetaminophen (TYLENOL) tablet 650 mg (650 mg Oral Given 01/14/23 2025)    ED Course/ Medical Decision Making/ A&P                           Medical Decision Making Amount and/or Complexity of Data Reviewed Labs: ordered. Radiology: ordered.  Risk OTC drugs.   This patient presents to the ED for concern of headache, general malaise, this involves an extensive number of treatment options, and is a complaint that carries with it a high risk of complications and morbidity.  The differential diagnosis includes sepsis, pneumonia, viral URI, UTI, pyelonephritis   Co morbidities that complicate the patient evaluation  Takotsubo cardiomyopathy,  hyperlipidemia   Additional history obtained:  Additional history obtained from recent inpatient admission from 01/02/2023   Lab Tests:  I Ordered, and personally interpreted labs.  The pertinent results include:  WBC elevated at 19.6, RBC at 3.78, all labs rather normal without obvious abnormalities. Urine has small amount of blood but patient not currently experiencing flank pain concerning for stones   Imaging Studies ordered:  I ordered imaging studies including chest xray  I independently visualized and interpreted imaging which showed no acute cardiopulmonary process I agree with the radiologist interpretation   Cardiac Monitoring: / EKG:  The patient was maintained on a cardiac monitor.  I personally viewed and interpreted the cardiac monitored which showed an underlying rhythm of: sinus rhythm   Consultations Obtained:  I requested consultation with the hospitalist, Dr. Myna Hidalgo, and discussed lab and imaging findings as well as pertinent plan - they recommend: outpatient follow-up with PCP. Although patient does have elevated WBC, all of her labs and vitals are otherwise reassuring and Dr. Myna Hidalgo did not believe repeat inpatient admission would benefit patient significantly more than outpatient follow up.   Problem List / ED Course / Critical interventions / Medication management  Patient presented to the emergency department for several weeks of headache and general malaise. Patient was recently admission for meeting SIRS criteria with a similar elevation in WBC. Patient denies any acute concerns such as chest pain, shortness of breath, abdominal pain, nausea, vomiting, or diarrhea. Given patient's presentation and recent inpatient stay, thorough workup performed which was largely reassuring and unimpressive from an acute illness standpoint. Patient does appear to have a resurgence in white blood count when compared to prior level which resulted in inpatient admission. At this  time, I cannot appreciate any obvious sources of infection in patient that would account for the dramatic elevation in WBC. As patient's vitals have largely been stable during ED encounter, she does not actively meet SIRS criteria and concern for possible sepsis. However, I have ordered blood cultures and urine cultures to evaluate any possible underlying bacteremia or urosepsis and advised patient that if one of these results are abnormal, she would  be contacted with next steps. Had lengthy discussion with Dr. Myna Hidalgo regarding this patient's case in light of recent hospitalization for somewhat similar presentation. Dr. Criss Rosales recommendation was to have patient follow up with PCP outpatient and likely further evaluation for other potential causes of leukocytosis including autoimmune response, malignancy, other source of infection. Patient generally does not appear ill, but advised patient to return to the ED if symptoms are not improving or she feels that she is worsening given that there is no clear etiology for the leukocytosis at this time. Patient was agreeable with this treatment plan and verbalized understanding return precautions. All questions answered prior to patient discharge. I ordered medication including tylenol  for fever  Reevaluation of the patient after these medicines showed that the patient improved I have reviewed the patients home medicines and have made adjustments as needed  Test / Admission - Considered:  Consulted with hospitalist for possible admission vs outpatient follow up. Outpatient follow up decided   Final Clinical Impression(s) / ED Diagnoses Final diagnoses:  Acute nonintractable headache, unspecified headache type  Malaise and fatigue  Leukocytosis, unspecified type  Urinary urgency    Rx / DC Orders ED Discharge Orders     None         Luvenia Heller, PA-C 01/15/23 0028    Hayden Rasmussen, MD 01/15/23 [id number]

## 2023-01-15 LAB — T4, FREE: Free T4: 0.91 ng/dL (ref 0.61–1.12)

## 2023-01-16 LAB — URINE CULTURE
Culture: NO GROWTH
Special Requests: NORMAL

## 2023-01-19 LAB — CULTURE, BLOOD (ROUTINE X 2)
Culture: NO GROWTH
Culture: NO GROWTH

## 2023-02-03 ENCOUNTER — Other Ambulatory Visit: Payer: Self-pay | Admitting: Internal Medicine

## 2023-02-03 DIAGNOSIS — Z1231 Encounter for screening mammogram for malignant neoplasm of breast: Secondary | ICD-10-CM

## 2023-02-12 ENCOUNTER — Inpatient Hospital Stay: Admission: RE | Admit: 2023-02-12 | Payer: Medicare Other | Source: Ambulatory Visit

## 2023-04-02 DIAGNOSIS — Z6824 Body mass index (BMI) 24.0-24.9, adult: Secondary | ICD-10-CM | POA: Diagnosis not present

## 2023-04-02 DIAGNOSIS — M19079 Primary osteoarthritis, unspecified ankle and foot: Secondary | ICD-10-CM | POA: Diagnosis not present

## 2023-04-02 DIAGNOSIS — J0100 Acute maxillary sinusitis, unspecified: Secondary | ICD-10-CM | POA: Diagnosis not present

## 2023-04-02 DIAGNOSIS — I10 Essential (primary) hypertension: Secondary | ICD-10-CM | POA: Diagnosis not present

## 2023-04-02 DIAGNOSIS — R0600 Dyspnea, unspecified: Secondary | ICD-10-CM | POA: Diagnosis not present

## 2023-05-18 ENCOUNTER — Ambulatory Visit: Payer: Medicare Other | Attending: Internal Medicine | Admitting: Internal Medicine

## 2023-05-18 ENCOUNTER — Encounter: Payer: Self-pay | Admitting: Internal Medicine

## 2023-05-18 VITALS — BP 118/72 | HR 73 | Ht 64.0 in | Wt 137.0 lb

## 2023-05-18 DIAGNOSIS — R062 Wheezing: Secondary | ICD-10-CM

## 2023-05-18 DIAGNOSIS — I5032 Chronic diastolic (congestive) heart failure: Secondary | ICD-10-CM | POA: Diagnosis not present

## 2023-05-18 DIAGNOSIS — I2510 Atherosclerotic heart disease of native coronary artery without angina pectoris: Secondary | ICD-10-CM | POA: Diagnosis not present

## 2023-05-18 DIAGNOSIS — R230 Cyanosis: Secondary | ICD-10-CM | POA: Diagnosis not present

## 2023-05-18 DIAGNOSIS — J383 Other diseases of vocal cords: Secondary | ICD-10-CM | POA: Insufficient documentation

## 2023-05-18 NOTE — Progress Notes (Signed)
Cardiology Office Note  Date: 05/18/2023   ID: [name], DOB Feb 14, 1961, MRN [id number]  PCP:  Sara Nevins, MD  Cardiologist:  Sara Bicker, MD Electrophysiologist:  None   Reason for Office Visit: Follow-up of Takotsubo cardiomyopathy   History of Present Illness: Sara Mcmahon is a 62 y.o. female known to have recurrent Takotsubo cardiomyopathy in 2016 and 2022 (LVEF 25% improved to 65% in 2022), nonobstructive CAD, HTN, HLD is here for follow-up visit.  Patient had prior occurrence of Takotsubo cardiomyopathy in 2016 in [place] which resolved. Patient had NSTEMI in 2022 with LVEF 25 to 30% and Takotsubo type wall motion abnormality.  LHC showed mild nonobstructive CAD. She did have some catheter induced vasospasm of the proximal RCA relieved by intracoronary nitroglycerin. Crestor was resumed due to LDL of 147 but patient discontinued due to intolerance of statins. She is here for follow-up visit. No DOE or angina.  No dizziness, syncope or palpitations. No leg swelling. She complained of cyanosis in her lips occurring intermittently at rest.  She also reported having wheezing after 30 minutes of talking over the phone.  Past Medical History:  Diagnosis Date   Cardiomyopathy [facility])    CHF (congestive heart failure) (HCC)    Fractures    GERD (gastroesophageal reflux disease)    Hepatitis    HLD (hyperlipidemia) 10/19/2019   Hypercholesteremia    Insomnia    Takotsubo cardiomyopathy 10/19/2019   Vitiligo     Past Surgical History:  Procedure Laterality Date   CARDIAC CATHETERIZATION     CARDIAC CATHETERIZATION     CESAREAN SECTION     LEFT HEART CATH AND CORONARY ANGIOGRAPHY N/A 01/13/2021   Procedure: LEFT HEART CATH AND CORONARY ANGIOGRAPHY;  Surgeon: Sara Kendall, MD;  Location: [facility] INVASIVE CV LAB;  Service: Cardiovascular;  Laterality: N/A;    Current Outpatient Medications  Medication Sig Dispense Refill   ALPRAZolam (XANAX) 0.5 MG tablet Take  1 tablet (0.5 mg total) by mouth at bedtime as needed for anxiety. 30 tablet 2   amoxicillin-clavulanate (AUGMENTIN) 875-125 MG tablet Take 1 tablet by mouth 2 (two) times daily. 10 day supply     ASHWAGANDHA PO Take by mouth.     aspirin EC 81 MG EC tablet Take 1 tablet (81 mg total) by mouth daily. Swallow whole. 30 tablet 11   benzonatate (TESSALON) 200 MG capsule Take 200 mg by mouth 3 (three) times daily.     CALCIUM PO Take 1 tablet by mouth daily in the afternoon.     carvedilol (COREG) 3.125 MG tablet Take 1 tablet (3.125 mg total) by mouth 2 (two) times daily with a meal. 180 tablet 3   cholecalciferol (VITAMIN D) 1000 units tablet Take 1,000 Units by mouth daily.     fluticasone (FLONASE) 50 MCG/ACT nasal spray Place 1 spray into both nostrils daily.     Ginger, Zingiber officinalis, (GINGER ROOT PO) Take 1 tablet by mouth daily.     LYSINE HCL PO Take 1 tablet by mouth daily.     MAGNESIUM PO Take 1 tablet by mouth daily in the afternoon.     montelukast (SINGULAIR) 10 MG tablet Take 1 tablet (10 mg total) by mouth every other day. 45 tablet 1   NON FORMULARY Plexus     potassium chloride (MICRO-K) 10 MEQ CR capsule TAKE 1 CAPSULE(10 MEQ) BY MOUTH DAILY 90 capsule 3   Turmeric (QC TUMERIC COMPLEX PO) Take 1 tablet by mouth daily.  meclizine (ANTIVERT) 25 MG tablet Take 1 tablet (25 mg total) by mouth 3 (three) times daily as needed for dizziness. (Patient not taking: Reported on 05/18/2023) 30 tablet 0   nitroGLYCERIN (NITROSTAT) 0.4 MG SL tablet Place 1 tablet (0.4 mg total) under the tongue every 5 (five) minutes as needed for chest pain. (Patient not taking: Reported on 05/18/2023) 25 tablet 3   traMADol (ULTRAM) 50 MG tablet Take 50 mg by mouth 4 (four) times daily as needed for severe pain. (Patient not taking: Reported on 05/18/2023)     No current facility-administered medications for this visit.   Allergies:  Omeprazole and Pollen extract   Social History: The patient   reports that she has never smoked. She has never used smokeless tobacco. She reports that she does not drink alcohol and does not use drugs.   Family History: The patient's family history includes Cancer in her mother; Heart disease in her father; Hypertension in her father and mother.   ROS:  Please see the history of present illness. Otherwise, complete review of systems is positive for none.  All other systems are reviewed and negative.   Physical Exam: VS:  BP 118/72 (BP Location: Left Arm, Patient Position: Sitting, Cuff Size: Normal)   Pulse 73   Ht 5\' 4"  (1.626 m)   Wt 137 lb (62.1 kg)   SpO2 96%   BMI 23.52 kg/m , BMI Body mass index is 23.52 kg/m.  Wt Readings from Last 3 Encounters:  05/18/23 137 lb (62.1 kg)  01/02/23 136 lb 11 oz (62 kg)  12/30/22 136 lb (61.7 kg)    General: Patient appears comfortable at rest. HEENT: Conjunctiva and lids normal, oropharynx clear with moist mucosa. Neck: Supple, no elevated JVP or carotid bruits, no thyromegaly. Lungs: Clear to auscultation, nonlabored breathing at rest. Cardiac: Regular rate and rhythm, no S3 or significant systolic murmur, no pericardial rub. Abdomen: Soft, nontender, no hepatomegaly, bowel sounds present, no guarding or rebound. Extremities: No pitting edema, distal pulses 2+. Skin: Warm and dry. Musculoskeletal: No kyphosis. Neuropsychiatric: Alert and oriented x3, affect grossly appropriate.  Recent Labwork: 01/04/2023: Magnesium 2.0 01/14/2023: ALT 16; AST 20; BUN 12; Creatinine, Ser 0.77; Hemoglobin 11.8; Platelets 326; Potassium 3.5; Sodium 134; TSH 1.284     Component Value Date/Time   CHOL 151 05/22/2021 1135   TRIG 137 05/22/2021 1135   HDL 49 (L) 05/22/2021 1135   CHOLHDL 3.1 05/22/2021 1135   LDLCALC 78 05/22/2021 1135   LDLDIRECT 162.0 (H) 01/12/2021 1600    Assessment and Plan:  Patient is a 62 year old F known to have recurrent Takotsubo cardiomyopathy in 2016 and 2022 (LVEF 25% improved to 65%  in 2022), nonobstructive CAD, HTN, HLD is here for follow-up visit.  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 No orthopnea or PND. -Continue carvedilol 3.125 mg twice daily.  # Peripheral cyanosis and wheezing -Atypical symptoms, echocardiogram from 2022 showed normal LVEF and no evidence of interatrial communication. Peripheral cyanosis could also be secondary to Raynaud's/or connective tissue disease. Wheezing I believe is likely secondary to vocal cord dysfunction (as she hears wheezing after talking over the phone for more than 30 minutes) which she will need to follow-up with PCP. But I will obtain PFTs to complete the workup and to r/o asthma as she has triggering factors.  No wheezing on physical examination.  # Nonobstructive CAD -LHC revealed nonobstructive CAD.  Catheter induced  spasm of the proximal RCA which was relieved by intracoronary nitroglycerin.  # HLD -Statin intolerance, off statins, LDL 78 without.  Check lipids with PCP.  # HTN, controlled -Continue low-dose carvedilol 3.125 mg twice daily.  I have spent a total of 30 minutes with patient reviewing chart, EKGs, labs and examining patient as well as establishing an assessment and plan that was discussed with the patient.  > 50% of time was spent in direct patient care.    Medication Adjustments/Labs and Tests Ordered: Current medicines are reviewed at length with the patient today.  Concerns regarding medicines are outlined above.   Tests Ordered: Orders Placed This Encounter  Procedures   Pulmonary function test    Medication Changes: No orders of the defined types were placed in this encounter.   Disposition:  Follow up  1 year  Signed, Aditya Nastasi Verne Spurr, MD, 05/18/2023 11:32 AM    [facility] at [facility] [address] [place], [place] [address]

## 2023-05-18 NOTE — Patient Instructions (Addendum)
Medication Instructions:  Your physician recommends that you continue on your current medications as directed. Please refer to the Current Medication list given to you today.   Labwork: None today  Testing/Procedures: Your physician has recommended that you have a pulmonary function test. Pulmonary Function Tests are a group of tests that measure how well air moves in and out of your lungs.   Follow-Up: 1 year Dr.Mallipeddi  Any Other Special Instructions Will Be Listed Below (If Applicable).  If you need a refill on your cardiac medications before your next appointment, please call your pharmacy.

## 2023-06-14 ENCOUNTER — Ambulatory Visit (HOSPITAL_COMMUNITY)
Admission: RE | Admit: 2023-06-14 | Discharge: 2023-06-14 | Disposition: A | Discharge status: Home / Self Care | Payer: Medicare Other | Source: Ambulatory Visit | Attending: Internal Medicine | Admitting: Internal Medicine

## 2023-06-14 DIAGNOSIS — R062 Wheezing: Secondary | ICD-10-CM

## 2023-06-14 LAB — PULMONARY FUNCTION TEST
DL/VA % pred: 87 %
DL/VA: 3.68 ml/min/mmHg/L
DLCO unc % pred: 68 %
DLCO unc: 13.21 ml/min/mmHg
FEF 25-75 Post: 2.89 L/sec
FEF 25-75 Pre: 2.35 L/sec
FEF2575-%Change-Post: 22 %
FEF2575-%Pred-Post: 134 %
FEF2575-%Pred-Pre: 109 %
FEV1-%Change-Post: 5 %
FEV1-%Pred-Post: 91 %
FEV1-%Pred-Pre: 87 %
FEV1-Post: 2.2 L
FEV1-Pre: 2.09 L
FEV1FVC-%Change-Post: 1 %
FEV1FVC-%Pred-Pre: 106 %
FEV6-%Change-Post: 4 %
FEV6-%Pred-Post: 88 %
FEV6-%Pred-Pre: 84 %
FEV6-Post: 2.65 L
FEV6-Pre: 2.54 L
FEV6FVC-%Pred-Post: 104 %
FEV6FVC-%Pred-Pre: 104 %
FVC-%Change-Post: 4 %
FVC-%Pred-Post: 84 %
FVC-%Pred-Pre: 81 %
FVC-Post: 2.65 L
FVC-Pre: 2.54 L
Post FEV1/FVC ratio: 83 %
Post FEV6/FVC ratio: 100 %
Pre FEV1/FVC ratio: 82 %
Pre FEV6/FVC Ratio: 100 %
RV % pred: 82 %
RV: 1.68 L
TLC % pred: 87 %
TLC: 4.37 L

## 2023-06-14 MED ORDER — ALBUTEROL SULFATE (2.5 MG/3ML) 0.083% IN NEBU
2.5000 mg | INHALATION_SOLUTION | Freq: Once | RESPIRATORY_TRACT | Status: AC
  Administered 2023-06-14: 2.5 mg via RESPIRATORY_TRACT

## 2023-07-23 ENCOUNTER — Other Ambulatory Visit: Payer: Self-pay

## 2023-07-23 MED ORDER — POTASSIUM CHLORIDE ER 10 MEQ PO CPCR: ORAL_CAPSULE | ORAL | 3 refills | Status: DC

## 2023-08-23 DIAGNOSIS — I10 Essential (primary) hypertension: Secondary | ICD-10-CM | POA: Diagnosis not present

## 2023-08-23 DIAGNOSIS — Z6823 Body mass index (BMI) 23.0-23.9, adult: Secondary | ICD-10-CM | POA: Diagnosis not present

## 2023-08-23 DIAGNOSIS — R300 Dysuria: Secondary | ICD-10-CM | POA: Diagnosis not present

## 2023-08-23 DIAGNOSIS — M19079 Primary osteoarthritis, unspecified ankle and foot: Secondary | ICD-10-CM | POA: Diagnosis not present

## 2023-08-23 DIAGNOSIS — J302 Other seasonal allergic rhinitis: Secondary | ICD-10-CM | POA: Diagnosis not present

## 2024-01-10 DIAGNOSIS — D518 Other vitamin B12 deficiency anemias: Secondary | ICD-10-CM | POA: Diagnosis not present

## 2024-01-10 DIAGNOSIS — Z6824 Body mass index (BMI) 24.0-24.9, adult: Secondary | ICD-10-CM | POA: Diagnosis not present

## 2024-01-10 DIAGNOSIS — Z0001 Encounter for general adult medical examination with abnormal findings: Secondary | ICD-10-CM | POA: Diagnosis not present

## 2024-01-10 DIAGNOSIS — G9332 Myalgic encephalomyelitis/chronic fatigue syndrome: Secondary | ICD-10-CM | POA: Diagnosis not present

## 2024-01-10 DIAGNOSIS — E782 Mixed hyperlipidemia: Secondary | ICD-10-CM | POA: Diagnosis not present

## 2024-01-10 DIAGNOSIS — R1310 Dysphagia, unspecified: Secondary | ICD-10-CM | POA: Diagnosis not present

## 2024-01-10 DIAGNOSIS — E559 Vitamin D deficiency, unspecified: Secondary | ICD-10-CM | POA: Diagnosis not present

## 2024-01-10 DIAGNOSIS — I872 Venous insufficiency (chronic) (peripheral): Secondary | ICD-10-CM | POA: Diagnosis not present

## 2024-01-10 DIAGNOSIS — M19079 Primary osteoarthritis, unspecified ankle and foot: Secondary | ICD-10-CM | POA: Diagnosis not present

## 2024-02-25 ENCOUNTER — Other Ambulatory Visit (HOSPITAL_COMMUNITY): Payer: Self-pay | Admitting: Internal Medicine

## 2024-02-25 DIAGNOSIS — R1310 Dysphagia, unspecified: Secondary | ICD-10-CM

## 2024-05-18 ENCOUNTER — Encounter: Payer: Self-pay | Admitting: Internal Medicine

## 2024-05-18 ENCOUNTER — Ambulatory Visit: Attending: Internal Medicine | Admitting: Internal Medicine

## 2024-05-18 VITALS — BP 114/64 | HR 74 | Ht 64.0 in | Wt 137.8 lb

## 2024-05-18 DIAGNOSIS — I2510 Atherosclerotic heart disease of native coronary artery without angina pectoris: Secondary | ICD-10-CM

## 2024-05-18 DIAGNOSIS — I10 Essential (primary) hypertension: Secondary | ICD-10-CM | POA: Diagnosis not present

## 2024-05-18 MED ORDER — METOPROLOL SUCCINATE ER 25 MG PO TB24: 12.5000 mg | ORAL_TABLET | Freq: Every day | ORAL | 3 refills | Status: DC

## 2024-05-18 MED ORDER — LOSARTAN POTASSIUM 25 MG PO TABS: 12.5000 mg | ORAL_TABLET | Freq: Every day | ORAL | 3 refills | Status: DC

## 2024-05-18 NOTE — Patient Instructions (Addendum)
 Medication Instructions:  Your physician has recommended you make the following change in your medication:  Stop taking Coreg  Start Metoprolol Succinate 12.5 mg once daily  Start Losartan 12.5 mg once daily Continue taking all other medications as prescribed  Labwork: None  Testing/Procedures: None  Follow-Up: Your physician recommends that you schedule a follow-up appointment in: 1 year. You will receive a reminder call in about 8 months reminding you to schedule your appointment. If you don't receive this call, please contact our office.   Any Other Special Instructions Will Be Listed Below (If Applicable). Thank you for choosing [facility]!     If you need a refill on your cardiac medications before your next appointment, please call your pharmacy.

## 2024-05-18 NOTE — Progress Notes (Signed)
 Cardiology Office Note  Date: 05/18/2024   ID: Sara Mcmahon, DOB 10-Nov-1961, MRN [id number]  PCP:  Kathyleen Parkins, MD  Cardiologist:  Ludella Pranger P Garvey Westcott, MD Electrophysiologist:  None   Reason for Office Visit: Follow-up of Takotsubo cardiomyopathy   History of Present Illness: Sara Mcmahon is a 63 y.o. female known to have recurrent Takotsubo cardiomyopathy in 2016 and 2022 (LVEF 25% improved to 65% in 2022), nonobstructive CAD, HTN, HLD is here for follow-up visit.  Patient had prior occurrence of Takotsubo cardiomyopathy in 2016 in [place] which resolved. Patient had NSTEMI in 2022 with LVEF 25 to 30% and Takotsubo type wall motion abnormality.  LHC showed mild nonobstructive CAD. She did have some catheter induced vasospasm of the proximal RCA relieved by intracoronary nitroglycerin . Crestor  was resumed due to LDL of 147 but patient discontinued due to intolerance of statins. She is here for follow-up visit.  No angina or DOE in the last 1 year although she did have some chest pains after she ate meals and think this could be from gas.  She did not have to take sublingual nitroglycerin .  No dizziness, palpitations, syncope.  She reports having dry mouth and finds herself breathing with her mouth open sometimes in the morning when she wakes up.  Past Medical History:  Diagnosis Date   Cardiomyopathy [facility])    CHF (congestive heart failure) (HCC)    Fractures    GERD (gastroesophageal reflux disease)    Hepatitis    HLD (hyperlipidemia) 10/19/2019   Hypercholesteremia    Insomnia    Takotsubo cardiomyopathy 10/19/2019   Vitiligo     Past Surgical History:  Procedure Laterality Date   CARDIAC CATHETERIZATION     CARDIAC CATHETERIZATION     CESAREAN SECTION     LEFT HEART CATH AND CORONARY ANGIOGRAPHY N/A 01/13/2021   Procedure: LEFT HEART CATH AND CORONARY ANGIOGRAPHY;  Surgeon: Sara Crisp, MD;  [facility];  Service: Cardiovascular;  Laterality:  N/A;    Current Outpatient Medications  Medication Sig Dispense Refill   ALPRAZolam  (XANAX ) 0.5 MG tablet Take 1 tablet (0.5 mg total) by mouth at bedtime as needed for anxiety. 30 tablet 2   ASHWAGANDHA PO Take by mouth.     aspirin  EC 81 MG EC tablet Take 1 tablet (81 mg total) by mouth daily. Swallow whole. 30 tablet 11   carvedilol  (COREG ) 3.125 MG tablet Take 1 tablet (3.125 mg total) by mouth 2 (two) times daily with a meal. 180 tablet 3   cholecalciferol  (VITAMIN D ) 1000 units tablet Take 1,000 Units by mouth daily.     ezetimibe (ZETIA) 10 MG tablet Take 10 mg by mouth daily.     fluticasone  (FLONASE ) 50 MCG/ACT nasal spray Place 1 spray into both nostrils daily. (Patient taking differently: Place 1 spray into both nostrils as needed.)     LYSINE HCL PO Take 1 tablet by mouth daily.     MAGNESIUM PO Take 1 tablet by mouth daily in the afternoon.     meclizine  (ANTIVERT ) 25 MG tablet Take 1 tablet (25 mg total) by mouth 3 (three) times daily as needed for dizziness. 30 tablet 0   montelukast  (SINGULAIR ) 10 MG tablet Take 1 tablet (10 mg total) by mouth every other day. 45 tablet 1   nitroGLYCERIN  (NITROSTAT ) 0.4 MG SL tablet Place 1 tablet (0.4 mg total) under the tongue every 5 (five) minutes as needed for chest pain. 25 tablet 3   potassium chloride  (  MICRO-K ) 10 MEQ CR capsule TAKE 1 CAPSULE(10 MEQ) BY MOUTH DAILY 90 capsule 3   traMADol  (ULTRAM ) 50 MG tablet Take 50 mg by mouth 4 (four) times daily as needed for severe pain.     Turmeric (QC TUMERIC COMPLEX PO) Take 1 tablet by mouth daily.     amoxicillin-clavulanate (AUGMENTIN) 875-125 MG tablet Take 1 tablet by mouth 2 (two) times daily. 10 day supply (Patient not taking: Reported on 05/18/2024)     benzonatate  (TESSALON ) 200 MG capsule Take 200 mg by mouth 3 (three) times daily. (Patient not taking: Reported on 05/18/2024)     CALCIUM  PO Take 1 tablet by mouth daily in the afternoon. (Patient not taking: Reported on 05/18/2024)      Ginger, Zingiber officinalis, (GINGER ROOT PO) Take 1 tablet by mouth daily. (Patient not taking: Reported on 05/18/2024)     NON FORMULARY Plexus (Patient not taking: Reported on 05/18/2024)     No current facility-administered medications for this visit.   Allergies:  Omeprazole  and Pollen extract   Social History: The patient  reports that she has never smoked. She has never used smokeless tobacco. She reports that she does not drink alcohol and does not use drugs.   Family History: The patient's family history includes Cancer in her mother; Heart disease in her father; Hypertension in her father and mother.   ROS:  Please see the history of present illness. Otherwise, complete review of systems is positive for none.  All other systems are reviewed and negative.   Physical Exam: VS:  BP 114/64   Pulse 74   Ht 5' 4 (1.626 m)   Wt 137 lb 12.8 oz (62.5 kg)   SpO2 96%   BMI 23.65 kg/m , BMI Body mass index is 23.65 kg/m.  Wt Readings from Last 3 Encounters:  05/18/24 137 lb 12.8 oz (62.5 kg)  05/18/23 137 lb (62.1 kg)  01/02/23 136 lb 11 oz (62 kg)    General: Patient appears comfortable at rest. HEENT: Conjunctiva and lids normal, oropharynx clear with moist mucosa. Neck: Supple, no elevated JVP or carotid bruits, no thyromegaly. Lungs: Clear to auscultation, nonlabored breathing at rest. Cardiac: Regular rate and rhythm, no S3 or significant systolic murmur, no pericardial rub. Abdomen: Soft, nontender, no hepatomegaly, bowel sounds present, no guarding or rebound. Extremities: No pitting edema, distal pulses 2+. Skin: Warm and dry. Musculoskeletal: No kyphosis. Neuropsychiatric: Alert and oriented x3, affect grossly appropriate.  Recent Labwork: No results found for requested labs within last 365 days.     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  Due to recurrent Takotsubo cardiomyopathy in 2016 and 2022, will start her on GDMT.  Discontinue carvedilol , start metoprolol succinate 12.5 mg once daily and losartan 12.5 mg once daily.  If she does not tolerate these medications, okay to go back on carvedilol .  She verbalized understanding of the plan.  # Nonobstructive CAD -LHC revealed nonobstructive CAD.  Catheter induced spasm of the proximal RCA which was relieved by intracoronary nitroglycerin . - No angina or DOE in the last 1 year.  She did have some chest pains with this could be from  GERD.  Did not have to take sublingual nitroglycerin .  # HLD, not at goal - Did not tolerate statins.  Currently on Zetia 10 mg once daily.  Goal LDL less than 100.  Can follow-up with PCP for HLD management.  # HTN, controlled - Continue above medications, as stated.    Medication Adjustments/Labs and Tests Ordered: Current medicines are reviewed at length with the patient today.  Concerns regarding medicines are outlined above.   Tests Ordered: Orders Placed This Encounter  Procedures   EKG 12-Lead    Medication Changes: No orders of the defined types were placed in this encounter.   Disposition:  Follow up 1 year  Signed, Sara Glynn Beauford Bounds, MD, 05/18/2024 10:45 AM    [facility] [address], [place], [place] [address]

## 2024-05-26 ENCOUNTER — Telehealth: Payer: Self-pay | Admitting: Internal Medicine

## 2024-05-26 MED ORDER — CARVEDILOL 3.125 MG PO TABS: 3.1250 mg | ORAL_TABLET | Freq: Two times a day (BID) | ORAL | Status: DC

## 2024-05-26 NOTE — Telephone Encounter (Signed)
 Patient notified and verbalized understanding.    She will go back to her Coreg  3.125mg  twice a day.

## 2024-05-26 NOTE — Telephone Encounter (Signed)
 Pt c/o medication issue:  1. Name of Medication:   metoprolol  succinate (TOPROL  XL) 25 MG 24 hr tablet    losartan  (COZAAR ) 25 MG tablet    2. How are you currently taking this medication (dosage and times per day)? As written   3. Are you having a reaction (difficulty breathing--STAT)? No   4. What is your medication issue? Pt called in stating these medications are causing her dizziness, fatigue, and headache and she does not want to take anymore. She asked if she can go back on Carvedilol . Please advise.

## 2024-07-25 DIAGNOSIS — I872 Venous insufficiency (chronic) (peripheral): Secondary | ICD-10-CM | POA: Diagnosis not present

## 2024-07-25 DIAGNOSIS — E782 Mixed hyperlipidemia: Secondary | ICD-10-CM | POA: Diagnosis not present

## 2024-07-25 DIAGNOSIS — Z6824 Body mass index (BMI) 24.0-24.9, adult: Secondary | ICD-10-CM | POA: Diagnosis not present

## 2024-07-25 DIAGNOSIS — I5181 Takotsubo syndrome: Secondary | ICD-10-CM | POA: Diagnosis not present

## 2024-07-25 DIAGNOSIS — G720 Drug-induced myopathy: Secondary | ICD-10-CM | POA: Diagnosis not present

## 2024-07-27 ENCOUNTER — Other Ambulatory Visit: Payer: Self-pay | Admitting: Internal Medicine

## 2024-07-31 MED ORDER — POTASSIUM CHLORIDE ER 10 MEQ PO CPCR: ORAL_CAPSULE | ORAL | 2 refills | Status: AC

## 2024-08-02 DIAGNOSIS — E782 Mixed hyperlipidemia: Secondary | ICD-10-CM | POA: Diagnosis not present

## 2024-08-20 ENCOUNTER — Emergency Department (HOSPITAL_COMMUNITY)

## 2024-08-20 ENCOUNTER — Encounter (HOSPITAL_COMMUNITY): Payer: Self-pay | Admitting: Emergency Medicine

## 2024-08-20 ENCOUNTER — Inpatient Hospital Stay (HOSPITAL_COMMUNITY)
Admission: EM | Admit: 2024-08-20 | Discharge: 2024-08-23 | DRG: 281 | Disposition: A | Discharge status: Home / Self Care | Attending: Family Medicine | Admitting: Family Medicine

## 2024-08-20 ENCOUNTER — Other Ambulatory Visit: Payer: Self-pay

## 2024-08-20 DIAGNOSIS — Z743 Need for continuous supervision: Secondary | ICD-10-CM | POA: Diagnosis not present

## 2024-08-20 DIAGNOSIS — I5181 Takotsubo syndrome: Secondary | ICD-10-CM | POA: Diagnosis not present

## 2024-08-20 DIAGNOSIS — E7800 Pure hypercholesterolemia, unspecified: Secondary | ICD-10-CM | POA: Diagnosis present

## 2024-08-20 DIAGNOSIS — I5021 Acute systolic (congestive) heart failure: Secondary | ICD-10-CM | POA: Diagnosis not present

## 2024-08-20 DIAGNOSIS — Z888 Allergy status to other drugs, medicaments and biological substances status: Secondary | ICD-10-CM | POA: Diagnosis not present

## 2024-08-20 DIAGNOSIS — I2510 Atherosclerotic heart disease of native coronary artery without angina pectoris: Secondary | ICD-10-CM | POA: Diagnosis not present

## 2024-08-20 DIAGNOSIS — I10 Essential (primary) hypertension: Secondary | ICD-10-CM | POA: Diagnosis not present

## 2024-08-20 DIAGNOSIS — Z79899 Other long term (current) drug therapy: Secondary | ICD-10-CM

## 2024-08-20 DIAGNOSIS — I214 Non-ST elevation (NSTEMI) myocardial infarction: Principal | ICD-10-CM | POA: Diagnosis present

## 2024-08-20 DIAGNOSIS — D638 Anemia in other chronic diseases classified elsewhere: Secondary | ICD-10-CM | POA: Diagnosis not present

## 2024-08-20 DIAGNOSIS — I252 Old myocardial infarction: Secondary | ICD-10-CM

## 2024-08-20 DIAGNOSIS — Z8249 Family history of ischemic heart disease and other diseases of the circulatory system: Secondary | ICD-10-CM | POA: Diagnosis not present

## 2024-08-20 DIAGNOSIS — Z91048 Other nonmedicinal substance allergy status: Secondary | ICD-10-CM

## 2024-08-20 DIAGNOSIS — R531 Weakness: Secondary | ICD-10-CM | POA: Diagnosis not present

## 2024-08-20 DIAGNOSIS — Z7982 Long term (current) use of aspirin: Secondary | ICD-10-CM

## 2024-08-20 DIAGNOSIS — R079 Chest pain, unspecified: Secondary | ICD-10-CM | POA: Diagnosis not present

## 2024-08-20 DIAGNOSIS — K219 Gastro-esophageal reflux disease without esophagitis: Secondary | ICD-10-CM | POA: Diagnosis not present

## 2024-08-20 LAB — PROTIME-INR
INR: 0.9 (ref 0.8–1.2)
Prothrombin Time: 12.7 s (ref 11.4–15.2)

## 2024-08-20 LAB — BASIC METABOLIC PANEL WITH GFR
Anion gap: 14 (ref 5–15)
BUN: 18 mg/dL (ref 8–23)
CO2: 20 mmol/L — ABNORMAL LOW (ref 22–32)
Calcium: 9.6 mg/dL (ref 8.9–10.3)
Chloride: 104 mmol/L (ref 98–111)
Creatinine, Ser: 0.94 mg/dL (ref 0.44–1.00)
GFR, Estimated: >60 mL/min (ref >60)
Glucose, Bld: 127 mg/dL — ABNORMAL HIGH (ref 70–99)
Potassium: 3.5 mmol/L (ref 3.5–5.1)
Sodium: 138 mmol/L (ref 135–145)

## 2024-08-20 LAB — CBC
HCT: 37.5 % (ref 36.0–46.0)
Hemoglobin: 12.8 g/dL (ref 12.0–15.0)
MCH: 31.4 pg (ref 26.0–34.0)
MCHC: 34.1 g/dL (ref 30.0–36.0)
MCV: 92.1 fL (ref 80.0–100.0)
Platelets: 293 K/uL (ref 150–400)
RBC: 4.07 MIL/uL (ref 3.87–5.11)
RDW: 12.9 % (ref 11.5–15.5)
WBC: 8 K/uL (ref 4.0–10.5)
nRBC: 0 % (ref 0.0–0.2)

## 2024-08-20 LAB — TROPONIN I (HIGH SENSITIVITY)
Troponin I (High Sensitivity): 21 ng/L — ABNORMAL HIGH (ref <18)
Troponin I (High Sensitivity): 1682 ng/L (ref <18)

## 2024-08-20 LAB — APTT: aPTT: 26 s (ref 24–36)

## 2024-08-20 MED ORDER — ALPRAZOLAM 0.5 MG PO TABS
0.5000 mg | ORAL_TABLET | Freq: Every day | ORAL | Status: DC
  Administered 2024-08-20 – 2024-08-22 (×3): 0.5 mg via ORAL
  Filled 2024-08-20 (×3): qty 1

## 2024-08-20 MED ORDER — ONDANSETRON HCL 4 MG PO TABS: 4.0000 mg | ORAL_TABLET | Freq: Four times a day (QID) | ORAL | Status: DC | PRN

## 2024-08-20 MED ORDER — LORAZEPAM 1 MG PO TABS
1.0000 mg | ORAL_TABLET | Freq: Once | ORAL | Status: AC
  Administered 2024-08-20: 1 mg via ORAL
  Filled 2024-08-20: qty 1

## 2024-08-20 MED ORDER — ACETAMINOPHEN 325 MG PO TABS
650.0000 mg | ORAL_TABLET | Freq: Four times a day (QID) | ORAL | Status: DC | PRN
  Administered 2024-08-21 – 2024-08-22 (×3): 650 mg via ORAL
  Filled 2024-08-20 (×3): qty 2

## 2024-08-20 MED ORDER — MORPHINE SULFATE (PF) 4 MG/ML IV SOLN
4.0000 mg | Freq: Once | INTRAVENOUS | Status: AC
  Administered 2024-08-20: 4 mg via INTRAVENOUS
  Filled 2024-08-20: qty 1

## 2024-08-20 MED ORDER — ONDANSETRON HCL 4 MG/2ML IJ SOLN
4.0000 mg | Freq: Once | INTRAMUSCULAR | Status: AC
  Administered 2024-08-20: 4 mg via INTRAVENOUS
  Filled 2024-08-20: qty 2

## 2024-08-20 MED ORDER — CARVEDILOL 3.125 MG PO TABS
3.1250 mg | ORAL_TABLET | Freq: Two times a day (BID) | ORAL | Status: DC
Start: 2024-08-20 — End: 2024-08-21
  Administered 2024-08-20 – 2024-08-21 (×2): 3.125 mg via ORAL
  Filled 2024-08-20 (×2): qty 1

## 2024-08-20 MED ORDER — MORPHINE SULFATE (PF) 2 MG/ML IV SOLN
2.0000 mg | INTRAVENOUS | Status: DC | PRN
  Administered 2024-08-22: 2 mg via INTRAVENOUS
  Filled 2024-08-20 (×2): qty 1

## 2024-08-20 MED ORDER — HEPARIN (PORCINE) 25000 UT/250ML-% IV SOLN
750.0000 [IU]/h | INTRAVENOUS | Status: DC
  Administered 2024-08-20: 750 [IU]/h via INTRAVENOUS
  Filled 2024-08-20: qty 250

## 2024-08-20 MED ORDER — FENTANYL CITRATE (PF) 100 MCG/2ML IJ SOLN
50.0000 ug | Freq: Once | INTRAMUSCULAR | Status: AC
  Administered 2024-08-20: 50 ug via INTRAVENOUS
  Filled 2024-08-20: qty 2

## 2024-08-20 MED ORDER — POTASSIUM CHLORIDE 20 MEQ PO PACK
40.0000 meq | PACK | Freq: Once | ORAL | Status: AC
  Administered 2024-08-20: 40 meq via ORAL
  Filled 2024-08-20: qty 2

## 2024-08-20 MED ORDER — HEPARIN BOLUS VIA INFUSION
3700.0000 [IU] | Freq: Once | INTRAVENOUS | Status: AC
  Administered 2024-08-20: 3700 [IU] via INTRAVENOUS

## 2024-08-20 MED ORDER — ONDANSETRON HCL 4 MG/2ML IJ SOLN: 4.0000 mg | Freq: Four times a day (QID) | INTRAMUSCULAR | Status: DC | PRN

## 2024-08-20 MED ORDER — ACETAMINOPHEN 650 MG RE SUPP: 650.0000 mg | Freq: Four times a day (QID) | RECTAL | Status: DC | PRN

## 2024-08-20 MED ORDER — POLYETHYLENE GLYCOL 3350 17 G PO PACK: 17.0000 g | PACK | Freq: Every day | ORAL | Status: DC | PRN

## 2024-08-20 MED ORDER — ALPRAZOLAM 0.5 MG PO TABS
0.5000 mg | ORAL_TABLET | Freq: Every day | ORAL | Status: DC
Start: 2024-08-21 — End: 2024-08-20

## 2024-08-20 MED ORDER — ATORVASTATIN CALCIUM 40 MG PO TABS
40.0000 mg | ORAL_TABLET | Freq: Every day | ORAL | Status: DC
  Filled 2024-08-20 (×2): qty 1

## 2024-08-20 MED ORDER — NITROGLYCERIN 0.4 MG SL SUBL
SUBLINGUAL_TABLET | SUBLINGUAL | Status: AC
  Administered 2024-08-20: 0.4 mg
  Filled 2024-08-20: qty 1

## 2024-08-20 MED ORDER — ASPIRIN 81 MG PO TBEC
81.0000 mg | DELAYED_RELEASE_TABLET | Freq: Every day | ORAL | Status: DC
  Administered 2024-08-21 – 2024-08-23 (×3): 81 mg via ORAL
  Filled 2024-08-20 (×3): qty 1

## 2024-08-20 MED ORDER — NITROGLYCERIN 0.4 MG SL SUBL: 0.4000 mg | SUBLINGUAL_TABLET | Freq: Once | SUBLINGUAL | Status: DC

## 2024-08-20 MED ORDER — ASPIRIN 81 MG PO CHEW
324.0000 mg | CHEWABLE_TABLET | Freq: Once | ORAL | Status: AC
  Administered 2024-08-20: 324 mg via ORAL
  Filled 2024-08-20: qty 4

## 2024-08-20 NOTE — ED Triage Notes (Signed)
 Pt c/o mid CP that feels like an elephant sitting on chest states she has a Hx of CHF and did take two nitroglycerins before coming here with some relief but pain is still there. Also states she took half of a xannax for her stress and anxiety. States dizziness and shob as well

## 2024-08-20 NOTE — Assessment & Plan Note (Addendum)
 Stable. - She is on carvedilol , resume,.  Metoprolol  also listed on med list, losartan  but she is not taking these.

## 2024-08-20 NOTE — H&P (Signed)
 History and Physical    Sara Mcmahon FMW:[id number] DOB: January 15, 1961 DOA: 08/20/2024  PCP: Bertell Satterfield, MD   Patient coming from: Home  I have personally briefly reviewed patient's old medical records in [facility]  Chief Complaint: Chest Pain  HPI: Sara Mcmahon is a 63 y.o. female with medical history significant for hypertension, Takotsubo cardiomyopathy, CHF, hypertension. Patient presented to the ED with complaints of sudden onset of left-sided chest pain that started today while she was in church.  She reports the feeling of an elephant sitting on her chest.  Also reports onset of dizziness and difficulty breathing soon after chest pain started.  She took 2 nitro without improvement in her symptoms.  She reports associated nausea without vomiting.  She reports she has been under unusual stress this past week-she is trying to sell the house she and her mother stays in, and deaths reported in the news.  ED Course: Temperature 98.  Heart rate 77-87.  Blood pressure systolic 123-140.  O2 sats greater than 95% on room air. Troponin 21 >> 1682. Chest x-ray without acute abnormality. EDP talked to cardiologist Dr. Michele, recommended admission to [facility] cardiology team to see on arrival.  Start heparin  drip.  Review of Systems: As per HPI all other systems reviewed and negative.  Past Medical History:  Diagnosis Date   Cardiomyopathy [facility])    CHF (congestive heart failure) (HCC)    Fractures    GERD (gastroesophageal reflux disease)    Hepatitis    HLD (hyperlipidemia) 10/19/2019   Hypercholesteremia    Insomnia    Takotsubo cardiomyopathy 10/19/2019   Vitiligo     Past Surgical History:  Procedure Laterality Date   CARDIAC CATHETERIZATION     CARDIAC CATHETERIZATION     CESAREAN SECTION     LEFT HEART CATH AND CORONARY ANGIOGRAPHY N/A 01/13/2021   Procedure: LEFT HEART CATH AND CORONARY ANGIOGRAPHY;  Surgeon: Mady Bruckner, MD;  [facility];   Service: Cardiovascular;  Laterality: N/A;     reports that she has never smoked. She has never used smokeless tobacco. She reports that she does not drink alcohol and does not use drugs.  Allergies  Allergen Reactions   Omeprazole  Swelling   Pollen Extract     Trees, grass, flowers    Family History  Problem Relation Age of Onset   Hypertension Mother    Cancer Mother    Heart disease Father    Hypertension Father     Prior to Admission medications   Medication Sig Start Date End Date Taking? Authorizing Provider  ALPRAZolam  (XANAX ) 0.5 MG tablet Take 1 tablet (0.5 mg total) by mouth at bedtime as needed for anxiety. Patient taking differently: Take 0.5 mg by mouth at bedtime. **May take 0.125mg -0.5mg  by mouth daily as needed for anxiety 05/27/21  Yes Gosrani, Nimish C, MD  aspirin  EC 81 MG EC tablet Take 1 tablet (81 mg total) by mouth daily. Swallow whole. 01/14/21  Yes Bhagat, Bhavinkumar, PA  carvedilol  (COREG ) 3.125 MG tablet Take 1 tablet (3.125 mg total) by mouth 2 (two) times daily. 05/26/24  Yes Mallipeddi, Vishnu P, MD  cholecalciferol  (VITAMIN D ) 1000 units tablet Take 2,000 Units by mouth daily.   Yes [provider]  ezetimibe (ZETIA) 10 MG tablet Take 10 mg by mouth every morning. 05/05/24  Yes [provider]  fluticasone  (FLONASE ) 50 MCG/ACT nasal spray Place 1 spray into both nostrils daily. Patient taking differently: Place 1 spray  into both nostrils daily as needed for allergies or rhinitis. 04/16/21  Yes [provider]  LYSINE HCL PO Take 1 tablet by mouth daily.   Yes [provider]  MAGNESIUM PO Take 1 tablet by mouth daily in the afternoon.   Yes [provider]  meclizine  (ANTIVERT ) 25 MG tablet Take 1 tablet (25 mg total) by mouth 3 (three) times daily as needed for dizziness. 01/04/23  Yes Shah, Pratik D, DO  montelukast  (SINGULAIR ) 10 MG tablet Take 1 tablet (10 mg total) by mouth every other day. 05/22/21  Yes Elnor Lauraine BRAVO, NP  nitroGLYCERIN  (NITROSTAT ) 0.4 MG SL tablet Place 1 tablet (0.4 mg total) under the tongue every 5 (five) minutes as needed for chest pain. 07/08/22  Yes Jeffrie Oneil BROCKS, MD  potassium chloride  (MICRO-K ) 10 MEQ CR capsule TAKE 1 CAPSULE(10 MEQ) BY MOUTH DAILY Patient taking differently: Take 10 mEq by mouth daily. TAKE 1 CAPSULE(10 MEQ) BY MOUTH DAILY 07/31/24  Yes Mallipeddi, Vishnu P, MD  traMADol  (ULTRAM ) 50 MG tablet Take 25 mg by mouth daily as needed for severe pain (pain score 7-10). 04/03/23  Yes [provider]  Turmeric (QC TUMERIC COMPLEX PO) Take 1 tablet by mouth daily.   Yes [provider]  amoxicillin-clavulanate (AUGMENTIN) 875-125 MG tablet Take 1 tablet by mouth 2 (two) times daily. Patient not taking: Reported on 08/20/2024 08/09/24   [provider]  losartan  (COZAAR ) 25 MG tablet Take 12.5 mg by mouth daily. Patient not taking: Reported on 08/20/2024    [provider]  metoprolol  succinate (TOPROL -XL) 25 MG 24 hr tablet Take 12.5 mg by mouth daily. Patient not taking: Reported on 08/20/2024    [provider]    Physical Exam: Vitals:   08/20/24 1655 08/20/24 1730 08/20/24 1827 08/20/24 1830  BP:   (!) 126/92 131/82  Pulse: 87 80 89 90  Resp: (!) 21 15 18 18   Temp:    98 F (36.7 C)  TempSrc:    Oral  SpO2: 97% 98% 99% 95%  Weight: 62.1 kg     Height: 5' 4 (1.626 m)       Constitutional: NAD, calm, comfortable Vitals:   08/20/24 1655 08/20/24 1730 08/20/24 1827 08/20/24 1830  BP:   (!) 126/92 131/82  Pulse: 87 80 89 90  Resp: (!) 21 15 18 18   Temp:    98 F (36.7 C)  TempSrc:    Oral  SpO2: 97% 98% 99% 95%  Weight: 62.1 kg     Height: 5' 4 (1.626 m)      Eyes: PERRL, lids and conjunctivae normal ENMT: Mucous membranes are moist. Posterior pharynx clear of any exudate or lesions.Normal dentition.  Neck: normal, supple, no masses, no thyromegaly Respiratory: clear to auscultation bilaterally, no  wheezing, no crackles. Normal respiratory effort. No accessory muscle use.  Cardiovascular: Regular rate and rhythm, no murmurs / rubs / gallops. No extremity edema.  Extremities warm. Abdomen: no tenderness, no masses palpated. No hepatosplenomegaly. Bowel sounds positive.  Musculoskeletal: no clubbing / cyanosis. No joint deformity upper and lower extremities.  Skin: no rashes, lesions, ulcers. No induration Neurologic: No facial asymmetry, moves extremity spontaneously, speech fluent.  Psychiatric: Normal judgment and insight. Alert and oriented x 3. Normal mood.   Labs on Admission: I have personally reviewed following labs and imaging studies  CBC: Recent Labs  Lab 08/20/24 1236  WBC 8.0  HGB 12.8  HCT 37.5  MCV 92.1  PLT 293  Basic Metabolic Panel: Recent Labs  Lab 08/20/24 1236  NA 138  K 3.5  CL 104  CO2 20*  GLUCOSE 127*  BUN 18  CREATININE 0.94  CALCIUM  9.6   Coagulation Profile: Recent Labs  Lab 08/20/24 1656  INR 0.9   Urine analysis:    Component Value Date/Time   COLORURINE COLORLESS (A) 01/14/2023 1949   APPEARANCEUR CLEAR 01/14/2023 1949   LABSPEC 1.003 (L) 01/14/2023 1949   PHURINE 8.0 01/14/2023 1949   GLUCOSEU NEGATIVE 01/14/2023 1949   HGBUR SMALL (A) 01/14/2023 1949   BILIRUBINUR NEGATIVE 01/14/2023 1949   KETONESUR NEGATIVE 01/14/2023 1949   PROTEINUR NEGATIVE 01/14/2023 1949   UROBILINOGEN 0.2 09/11/2014 1801   NITRITE NEGATIVE 01/14/2023 1949   LEUKOCYTESUR NEGATIVE 01/14/2023 1949    Radiological Exams on Admission: DG Chest 2 View Result Date: 08/20/2024 CLINICAL DATA:  Mid chest pain EXAM: CHEST - 2 VIEW COMPARISON:  01/14/2023 FINDINGS: Heart size within normal limits for AP technique. The lungs appear clear. No blunting of the costophrenic angles. No significant bony findings. IMPRESSION: 1. No active cardiopulmonary disease is radiographically apparent. Electronically Signed   By: Ryan Salvage M.D.   On: 08/20/2024 15:23     EKG: Independently reviewed.  Sinus rhythm, rate 81, QTc 459.  Assessment/Plan Principal Problem:   NSTEMI (non-ST elevated myocardial infarction) (HCC) Active Problems:   Essential hypertension, benign   CAD (coronary artery disease)  Assessment and Plan: * NSTEMI (non-ST elevated myocardial infarction) (HCC) Presenting with chest pain, troponin 21 > 1682.  Significant pain improvement with nitro.  History of Takotsubo cardiomyopathy 2016 and again in 2022, nonobstructive CAD.  Follows with cardiologist Dr. Mallipeddi.  -EDP talked to Dr.  Michele, admit to [facility], IV heparin  drip, cardiology team to see in consult -Trend troponin -IV morphin - Aspirin  324 x 1, continue 81 mg daily - Atorvastatin  40 mg daily - Resume carvedilol  - Echocardiogram  CAD (coronary artery disease) Last cardiac cath 2022- Mild, non-obstructive coronary artery disease with 30% proximal LAD disease as well as catheter-induced vasospasm involving the proximal RCA that improved from 95% to 10-20% following administration of intracoronary nitroglycerin . Severely reduced left-ventricular contraction with wall motion abnormality consistent with Takotsubo variant   Essential hypertension, benign Stable. - She is on carvedilol , resume,.  Metoprolol  also listed on med list, losartan  but she is not taking these.   DVT prophylaxis: heparin  Code Status: FULL code Family Communication: None at bedside Disposition Plan: ~ 2 days Consults called: Cardiology Admission status:  Inpt stepdown I certify that at the point of admission it is my clinical judgment that the patient will require inpatient hospital care spanning beyond 2 midnights from the point of admission due to high intensity of service, high risk for further deterioration and high frequency of surveillance required.   CRITICAL CARE Performed by: Tully FORBES Carwin   Total critical care time: 65 minutes  Critical care time was exclusive of  separately billable procedures and treating other patients.  Critical care was necessary to treat or prevent imminent or life-threatening deterioration.  Critical care was time spent personally by me on the following activities: development of treatment plan with patient and/or surrogate as well as nursing, discussions with consultants, evaluation of patient's response to treatment, examination of patient, obtaining history from patient or surrogate, ordering and performing treatments and interventions, ordering and review of laboratory studies, ordering and review of radiographic studies, pulse oximetry and re-evaluation of patient's condition.   Author: Nijae Doyel E Lakiesha Ralphs,  MD 08/20/2024 7:09 PM  For on call review [email or web address].

## 2024-08-20 NOTE — Assessment & Plan Note (Addendum)
 Presenting with chest pain, troponin 21 > 1682.  Significant pain improvement with nitro.  History of Takotsubo cardiomyopathy 2016 and again in 2022, nonobstructive CAD.  Follows with cardiologist Dr. Mallipeddi.  -EDP talked to Dr.  Michele, admit to [facility], IV heparin  drip, cardiology team to see in consult -Trend troponin -IV morphin - Aspirin  324 x 1, continue 81 mg daily - Atorvastatin  40 mg daily - Resume carvedilol  - Echocardiogram

## 2024-08-20 NOTE — Progress Notes (Signed)
 ON-CALL CARDIOLOGY 08/20/24  Patient's name: Sara Mcmahon.   MRN: [id number].    DOB: [birth year] Primary care provider: Bertell Satterfield, MD. Primary cardiologist: Vishnu P Mallipeddi, MD  Requesting provider: Sherran Barks PA, [facility] Consulting provider: Madonna Large, DO, [facility], [facility]   Interaction regarding this patient's care today: HPI as per requesting provider: Patient presents to the ED for evaluation of chest pain Sudden onset of chest pain while at church. Intensity initially 10 out of 10, now it has improved. Nonradiating. Improved with nitroglycerin   Rising troponin History of NSTEMI and Takotsubo cardiomyopathy  Was informed no contraindications to anticoagulation.  Impression: NSTEMI   Recommendations: Given the cardiovascular risk factors, rising troponins, initial EKG noted subtle ST depressions in the inferior and anterolateral leads which improved on prior EKG. She had a heart catheterization which noted episodes of coronary spasm which may be contributory. Recommended transfer to [facility], admit to medicine. Cardiology to consult. IV heparin  per ACS protocol if no contraindications. Repeat EKG if chest pain resurfaces Cardiology to see upon arrival  No charge.  Madonna Large HAS, [facility]  A Division of [facility] [address] [place], [place] [address]  08/20/2024 4:53 PM

## 2024-08-20 NOTE — Assessment & Plan Note (Addendum)
 Last cardiac cath 2022- Mild, non-obstructive coronary artery disease with 30% proximal LAD disease as well as catheter-induced vasospasm involving the proximal RCA that improved from 95% to 10-20% following administration of intracoronary nitroglycerin . Severely reduced left-ventricular contraction with wall motion abnormality consistent with Takotsubo variant

## 2024-08-20 NOTE — Consult Note (Signed)
 PHARMACY - ANTICOAGULATION CONSULT NOTE  Pharmacy Consult for heparin  infusion Indication: ACS/STEMI  Allergies  Allergen Reactions   Omeprazole  Swelling   Pollen Extract     Trees, grass, flowers    Patient Measurements: Height: 5' 4 (162.6 cm) Weight: 62.1 kg (136 lb 12.8 oz) IBW/kg (Calculated) : 54.7 HEPARIN  DW (KG): 62.1  Vital Signs: BP: 126/89 (09/14 1630) Pulse Rate: 87 (09/14 1655)  Labs: Recent Labs    08/20/24 1236 08/20/24 1507  HGB 12.8  --   HCT 37.5  --   PLT 293  --   CREATININE 0.94  --   TROPONINIHS 21* 1,682*    Estimated Creatinine Clearance: 51.5 mL/min (by C-G formula based on SCr of 0.94 mg/dL).   Medical History: Past Medical History:  Diagnosis Date   Cardiomyopathy (HCC)    CHF (congestive heart failure) (HCC)    Fractures    GERD (gastroesophageal reflux disease)    Hepatitis    HLD (hyperlipidemia) 10/19/2019   Hypercholesteremia    Insomnia    Takotsubo cardiomyopathy 10/19/2019   Vitiligo     Medications:  No home anticoagulants per pharmacist review  Assessment: 64 yo female presented to ED with complaint of chest pain/pressure.  Patient found to have elevated troponin.  Pharmacy consulted to initiate heparin  infusion.  Baseline aPTT and PT/INR ordered  Goal of Therapy:  Heparin  level 0.3-0.7 units/ml Monitor platelets by anticoagulation protocol: Yes   Plan:  Give 3700 units bolus x 1 Start heparin  infusion at 750 units/hr Check anti-Xa level in 6 hours and daily while on heparin  Continue to monitor H&H and platelets  Kayla JULIANNA Blew, PharmD 08/20/2024,4:59 PM

## 2024-08-20 NOTE — ED Provider Notes (Signed)
 [place] EMERGENCY DEPARTMENT AT [facility] Provider Note   CSN: [id number] Arrival date & time: 08/20/24  1158     Patient presents with: Chest Pain   Sara Mcmahon is a 63 y.o. female.  History of NSTEMI, Takotsubo cardiomyopathy, hypertension, hyperlipidemia.  She presents the ER today complaining of chest pain that is just left of her sternum that is nonradiating and is severe pressure-like pain.  She has some mild improvement with nitroglycerin  and has had 2 total doses prior to arrival and the pain had recurred.  States she was in church when this happened.  She states it feels like her prior episodes of Takotsubo cardiomyopathy.. She states she has not dizziness with this today as well, denies any nausea or vomiting, no shortness of breath, no fever or chills, no other complaints.    Chest Pain      Prior to Admission medications   Medication Sig Start Date End Date Taking? Authorizing Provider  ALPRAZolam  (XANAX ) 0.5 MG tablet Take 1 tablet (0.5 mg total) by mouth at bedtime as needed for anxiety. 05/27/21   Gosrani, Nimish C, MD  ASHWAGANDHA PO Take by mouth.    [provider]  aspirin  EC 81 MG EC tablet Take 1 tablet (81 mg total) by mouth daily. Swallow whole. 01/14/21   Jerrie Anger, PA  CALCIUM  PO Take 1 tablet by mouth daily in the afternoon. Patient not taking: Reported on 05/18/2024    [provider]  carvedilol  (COREG ) 3.125 MG tablet Take 1 tablet (3.125 mg total) by mouth 2 (two) times daily. 05/26/24   Mallipeddi, Vishnu P, MD  cholecalciferol  (VITAMIN D ) 1000 units tablet Take 1,000 Units by mouth daily.    [provider]  ezetimibe (ZETIA) 10 MG tablet Take 10 mg by mouth daily. 05/05/24   [provider]  fluticasone  (FLONASE ) 50 MCG/ACT nasal spray Place 1 spray into both nostrils daily. Patient taking differently: Place 1 spray into both nostrils as needed. 04/16/21   [provider]  LYSINE HCL  PO Take 1 tablet by mouth daily.    [provider]  MAGNESIUM PO Take 1 tablet by mouth daily in the afternoon.    [provider]  meclizine  (ANTIVERT ) 25 MG tablet Take 1 tablet (25 mg total) by mouth 3 (three) times daily as needed for dizziness. 01/04/23   Maree, Pratik D, DO  montelukast  (SINGULAIR ) 10 MG tablet Take 1 tablet (10 mg total) by mouth every other day. 05/22/21   Elnor Lauraine BRAVO, NP  nitroGLYCERIN  (NITROSTAT ) 0.4 MG SL tablet Place 1 tablet (0.4 mg total) under the tongue every 5 (five) minutes as needed for chest pain. 07/08/22   Jeffrie Oneil BROCKS, MD  potassium chloride  (MICRO-K ) 10 MEQ CR capsule TAKE 1 CAPSULE(10 MEQ) BY MOUTH DAILY 07/31/24   Mallipeddi, Vishnu P, MD  traMADol  (ULTRAM ) 50 MG tablet Take 50 mg by mouth 4 (four) times daily as needed for severe pain. 04/03/23   [provider]  Turmeric (QC TUMERIC COMPLEX PO) Take 1 tablet by mouth daily.    [provider]    Allergies: Omeprazole  and Pollen extract    Review of Systems  Cardiovascular:  Positive for chest pain.    Updated Vital Signs BP 126/87   Pulse 79   Resp 15   SpO2 97%   Physical Exam Vitals and nursing note reviewed.  Constitutional:      General: She is not in acute distress.  Appearance: She is well-developed.  HENT:     Head: Normocephalic and atraumatic.  Eyes:     Conjunctiva/sclera: Conjunctivae normal.  Cardiovascular:     Rate and Rhythm: Normal rate and regular rhythm.     Heart sounds: No murmur heard. Pulmonary:     Effort: Pulmonary effort is normal. No respiratory distress.     Breath sounds: Normal breath sounds.  Abdominal:     Palpations: Abdomen is soft.     Tenderness: There is no abdominal tenderness.  Musculoskeletal:        General: No swelling.     Cervical back: Neck supple.  Skin:    General: Skin is warm and dry.     Capillary Refill: Capillary refill takes less than 2 seconds.  Neurological:     Mental Status: She is  alert.  Psychiatric:        Mood and Affect: Mood normal.     (all labs ordered are listed, but only abnormal results are displayed) Labs Reviewed  CBC  BASIC METABOLIC PANEL WITH GFR  TROPONIN I (HIGH SENSITIVITY)    EKG: EKG Interpretation Date/Time:  Sunday August 20 2024 12:08:48 EDT Ventricular Rate:  81 PR Interval:    QRS Duration:  91 QT Interval:  395 QTC Calculation: 459 R Axis:   85  Text Interpretation: probable sinus with artifact Borderline right axis deviation Minimal ST depression Confirmed by Towana Sharper [phone]) on 08/20/2024 12:46:01 PM  Radiology: No results found.   .Critical Care  Performed by: Suellen Sherran LABOR, PA-C Authorized by: Suellen Sherran LABOR, PA-C   Critical care provider statement:    Critical care time (minutes):  30   Critical care was time spent personally by me on the following activities:  Development of treatment plan with patient or surrogate, discussions with consultants, evaluation of patient's response to treatment, examination of patient, ordering and review of laboratory studies, ordering and review of radiographic studies, ordering and performing treatments and interventions, pulse oximetry, re-evaluation of patient's condition, review of old charts and obtaining history from patient or surrogate   Care discussed with comment:  Dr. Michele, Dr. CHARLENA Carwin    Medications Ordered in the ED  nitroGLYCERIN  (NITROSTAT ) SL tablet 0.4 mg (0.4 mg Sublingual Not Given 08/20/24 1237)  nitroGLYCERIN  (NITROSTAT ) 0.4 MG SL tablet (0.4 mg  Given 08/20/24 1236)                                    Medical Decision Making This patient presents to the ED for concern of chest pain that started just prior to arrival while patient was at church.  She states it is pressure-like pain that was very slightly relieved with nitroglycerin .  Pain feels similar but worse than when she has had Takotsubo in the past which was most recently in 2022, this  involves an extensive number of treatment options, and is a complaint that carries with it a high risk of complications and morbidity.  The differential diagnosis includes ACS, PE, pneumonia, pulmonary embolism, dissection, other   Co morbidities that complicate the patient evaluation :   Takotsubo cardiomyopathy, hypertension, hyperlipidemia   Additional history obtained:  Additional history obtained from EMR External records from outside source obtained and reviewed including prior notes and labs   Lab Tests:  I Ordered, and personally interpreted labs.  The pertinent results include: CBC and BMP normal, troponin initially 21, subsequent troponin  at 2 hours was 1682   Imaging Studies ordered:  I ordered imaging studies including x-ray chest which shows no pulmonary edema or infiltrate, no pneumothorax I independently visualized and interpreted imaging within scope of identifying emergent findings  I agree with the radiologist interpretation   Cardiac Monitoring: / EKG:  The patient was maintained on a cardiac monitor.  I personally viewed and interpreted the cardiac monitored which showed an underlying rhythm of: Sinus rhythm, initially mild ST depressions, improved on repeat EKG   Consultations Obtained:  I requested consultation with the cardiologist Dr. Michele,  and discussed lab and imaging findings as well as pertinent plan - they recommend: Initiate heparin  and admit to hospitalist service at [facility], cardiology will see in consult when patient arrives.   Problem List / ED Course / Critical interventions / Medication management  STEMI-patient initially had some mild ST depressions which had resolved on subsequent EKG, troponin had significant elevation.  Patient still having severe chest pain though slightly improved with fentanyl  and morphine .  She states this feels exactly like when she had pain in the past and was subsequently diagnosed with Takotsubo cardiomyopathy.   She had a similar episode in 2023 and had a cath at that time that showed coronary vasospasm.  Discussed with Dr. Michele on-call for cardiology who states patient should be admitted to hospitalist service, started on heparin  and they will see patient when she gets there, potentially she had some coronary vasospasm that precipitated this episode but she does have risk factors for CAD as well.  I discussed the heparin  with patient and she is agreeable.  She has no bleeding, platelets are normal, tolerated well in the past.  Here I have reassessed patient numerous times, she was initially given nitroglycerin  for pain without relief, then given fentanyl  with mild relief, then morphine , and mild relief.  She is admittedly very anxious as she is worried about her heart and was given small amount of Ativan  to help with this and Zofran  for nausea.  She has remained hemodynamically stable.  I considered alternative diagnoses such as PE or dissection which I feel are less likely based on her clinical presentation, and do not feel need further workup for this at this time.   I ordered medication including morphine  and fentanyl  and nitroglycerin  for chest pain Reevaluation of the patient after these medicines showed that the patient improved mildly I have reviewed the patients home medicines and have made adjustments as needed   Social Determinants of Health:  Patient lives independently       Amount and/or Complexity of Data Reviewed Labs: ordered. Radiology: ordered.  Risk OTC drugs. Prescription drug management. Decision regarding hospitalization.        Final diagnoses:  None    ED Discharge Orders     None          Suellen Sherran DELENA DEVONNA 08/20/24 1743    Towana Ozell BROCKS, MD 08/20/24 [phone]

## 2024-08-20 NOTE — ED Notes (Signed)
 Report given to [facility] RN  of [facility] at this time. No questions, comments, or concerns after report was given.

## 2024-08-21 ENCOUNTER — Encounter (HOSPITAL_COMMUNITY): Payer: Self-pay | Admitting: Cardiology

## 2024-08-21 ENCOUNTER — Inpatient Hospital Stay (HOSPITAL_COMMUNITY)

## 2024-08-21 ENCOUNTER — Encounter (HOSPITAL_COMMUNITY)
Admission: EM | Disposition: A | Discharge status: Home / Self Care | Payer: Self-pay | Source: Home / Self Care | Attending: Family Medicine

## 2024-08-21 DIAGNOSIS — R079 Chest pain, unspecified: Secondary | ICD-10-CM | POA: Diagnosis not present

## 2024-08-21 DIAGNOSIS — I214 Non-ST elevation (NSTEMI) myocardial infarction: Secondary | ICD-10-CM | POA: Diagnosis not present

## 2024-08-21 LAB — ECHOCARDIOGRAM COMPLETE
AR max vel: 1.74 cm2
AV Area VTI: 1.65 cm2
AV Area mean vel: 1.63 cm2
AV Mean grad: 2 mmHg
AV Peak grad: 4.4 mmHg
Ao pk vel: 1.05 m/s
Area-P 1/2: 4.26 cm2
Calc EF: 28.2 %
Height: 64 in
MV VTI: 1.83 cm2
S' Lateral: 2.25 cm
Single Plane A2C EF: 33.9 %
Single Plane A4C EF: 30.7 %
Weight: 2188.8 [oz_av]

## 2024-08-21 LAB — CBC
HCT: 34.4 % — ABNORMAL LOW (ref 36.0–46.0)
Hemoglobin: 11.5 g/dL — ABNORMAL LOW (ref 12.0–15.0)
MCH: 31.1 pg (ref 26.0–34.0)
MCHC: 33.4 g/dL (ref 30.0–36.0)
MCV: 93 fL (ref 80.0–100.0)
Platelets: 225 K/uL (ref 150–400)
RBC: 3.7 MIL/uL — ABNORMAL LOW (ref 3.87–5.11)
RDW: 13.1 % (ref 11.5–15.5)
WBC: 8.3 K/uL (ref 4.0–10.5)
nRBC: 0 % (ref 0.0–0.2)

## 2024-08-21 LAB — LIPID PANEL
Cholesterol: 165 mg/dL (ref 0–200)
HDL: 39 mg/dL — ABNORMAL LOW (ref >40)
LDL Cholesterol: 102 mg/dL — ABNORMAL HIGH (ref 0–99)
Total CHOL/HDL Ratio: 4.2 ratio
Triglycerides: 119 mg/dL (ref <150)
VLDL: 24 mg/dL (ref 0–40)

## 2024-08-21 LAB — HEPARIN LEVEL (UNFRACTIONATED)
Heparin Unfractionated: 0.34 [IU]/mL (ref 0.30–0.70)
Heparin Unfractionated: 0.34 [IU]/mL (ref 0.30–0.70)

## 2024-08-21 LAB — BASIC METABOLIC PANEL WITH GFR
Anion gap: 6 (ref 5–15)
BUN: 15 mg/dL (ref 8–23)
CO2: 25 mmol/L (ref 22–32)
Calcium: 8.7 mg/dL — ABNORMAL LOW (ref 8.9–10.3)
Chloride: 107 mmol/L (ref 98–111)
Creatinine, Ser: 0.89 mg/dL (ref 0.44–1.00)
GFR, Estimated: >60 mL/min (ref >60)
Glucose, Bld: 115 mg/dL — ABNORMAL HIGH (ref 70–99)
Potassium: 3.6 mmol/L (ref 3.5–5.1)
Sodium: 138 mmol/L (ref 135–145)

## 2024-08-21 LAB — HEMOGLOBIN A1C
Hgb A1c MFr Bld: 5.2 % (ref 4.8–5.6)
Mean Plasma Glucose: 102.54 mg/dL

## 2024-08-21 LAB — HIV ANTIBODY (ROUTINE TESTING W REFLEX): HIV Screen 4th Generation wRfx: NONREACTIVE

## 2024-08-21 SURGERY — LEFT HEART CATH AND CORONARY ANGIOGRAPHY
Anesthesia: LOCAL

## 2024-08-21 MED ORDER — MIDAZOLAM HCL 2 MG/2ML IJ SOLN
INTRAMUSCULAR | Status: AC
  Filled 2024-08-21: qty 2

## 2024-08-21 MED ORDER — VERAPAMIL HCL 2.5 MG/ML IV SOLN
INTRAVENOUS | Status: DC | PRN
  Administered 2024-08-21: 10 mL via INTRA_ARTERIAL

## 2024-08-21 MED ORDER — HEPARIN (PORCINE) IN NACL 1000-0.9 UT/500ML-% IV SOLN
INTRAVENOUS | Status: DC | PRN
  Administered 2024-08-21 (×2): 500 mL

## 2024-08-21 MED ORDER — ASPIRIN 81 MG PO CHEW: 81.0000 mg | CHEWABLE_TABLET | ORAL | Status: DC

## 2024-08-21 MED ORDER — VERAPAMIL HCL 2.5 MG/ML IV SOLN
INTRAVENOUS | Status: AC
  Filled 2024-08-21: qty 2

## 2024-08-21 MED ORDER — EZETIMIBE 10 MG PO TABS
10.0000 mg | ORAL_TABLET | Freq: Every day | ORAL | Status: DC
  Administered 2024-08-21 – 2024-08-23 (×3): 10 mg via ORAL
  Filled 2024-08-21 (×3): qty 1

## 2024-08-21 MED ORDER — IOHEXOL 350 MG/ML SOLN
INTRAVENOUS | Status: DC | PRN
  Administered 2024-08-21: 55 mL

## 2024-08-21 MED ORDER — FREE WATER
500.0000 mL | Freq: Once | Status: AC
  Administered 2024-08-21: 500 mL via ORAL

## 2024-08-21 MED ORDER — FENTANYL CITRATE (PF) 100 MCG/2ML IJ SOLN
INTRAMUSCULAR | Status: AC
  Filled 2024-08-21: qty 2

## 2024-08-21 MED ORDER — LIDOCAINE HCL (PF) 1 % IJ SOLN
INTRAMUSCULAR | Status: DC | PRN
  Administered 2024-08-21: 2 mL via INTRADERMAL

## 2024-08-21 MED ORDER — MIDAZOLAM HCL 2 MG/2ML IJ SOLN
INTRAMUSCULAR | Status: DC | PRN
  Administered 2024-08-21: 1 mg via INTRAVENOUS

## 2024-08-21 MED ORDER — ATORVASTATIN CALCIUM 10 MG PO TABS
10.0000 mg | ORAL_TABLET | Freq: Every day | ORAL | Status: DC
  Administered 2024-08-21 – 2024-08-23 (×3): 10 mg via ORAL
  Filled 2024-08-21 (×3): qty 1

## 2024-08-21 MED ORDER — LIDOCAINE HCL (PF) 1 % IJ SOLN
INTRAMUSCULAR | Status: AC
  Filled 2024-08-21: qty 30

## 2024-08-21 MED ORDER — SODIUM CHLORIDE 0.9 % IV SOLN: 250.0000 mL | INTRAVENOUS | Status: AC | PRN

## 2024-08-21 MED ORDER — SPIRONOLACTONE 12.5 MG HALF TABLET
12.5000 mg | ORAL_TABLET | Freq: Every day | ORAL | Status: DC
Start: 2024-08-21 — End: 2024-08-23
  Administered 2024-08-21 – 2024-08-23 (×3): 12.5 mg via ORAL
  Filled 2024-08-21 (×3): qty 1

## 2024-08-21 MED ORDER — CARVEDILOL 6.25 MG PO TABS
6.2500 mg | ORAL_TABLET | Freq: Two times a day (BID) | ORAL | Status: DC
  Administered 2024-08-21 – 2024-08-23 (×4): 6.25 mg via ORAL
  Filled 2024-08-21 (×4): qty 1

## 2024-08-21 MED ORDER — HEPARIN SODIUM (PORCINE) 1000 UNIT/ML IJ SOLN
INTRAMUSCULAR | Status: DC | PRN
  Administered 2024-08-21: 4000 [IU] via INTRAVENOUS

## 2024-08-21 MED ORDER — FENTANYL CITRATE (PF) 100 MCG/2ML IJ SOLN
INTRAMUSCULAR | Status: DC | PRN
  Administered 2024-08-21 (×2): 25 ug via INTRAVENOUS

## 2024-08-21 MED ORDER — PERFLUTREN LIPID MICROSPHERE
1.0000 mL | INTRAVENOUS | Status: AC | PRN
  Administered 2024-08-21: 3 mL via INTRAVENOUS

## 2024-08-21 MED ORDER — LOSARTAN POTASSIUM 25 MG PO TABS
25.0000 mg | ORAL_TABLET | Freq: Every day | ORAL | Status: DC
  Administered 2024-08-21 – 2024-08-23 (×3): 25 mg via ORAL
  Filled 2024-08-21 (×4): qty 1

## 2024-08-21 MED ORDER — SODIUM CHLORIDE 0.9% FLUSH: 3.0000 mL | INTRAVENOUS | Status: DC | PRN

## 2024-08-21 MED ORDER — HEPARIN SODIUM (PORCINE) 1000 UNIT/ML IJ SOLN
INTRAMUSCULAR | Status: AC
  Filled 2024-08-21: qty 10

## 2024-08-21 SURGICAL SUPPLY — 9 items
CATH INFINITI 5FR ANG PIGTAIL (CATHETERS) IMPLANT
CATH INFINITI AMBI 5FR TG (CATHETERS) IMPLANT
CATH INFINITI JR4 5F (CATHETERS) IMPLANT
DEVICE RAD COMP TR BAND LRG (VASCULAR PRODUCTS) IMPLANT
GLIDESHEATH SLEND SS 6F .021 (SHEATH) IMPLANT
GUIDEWIRE INQWIRE 1.5J.035X260 (WIRE) IMPLANT
PACK CARDIAC CATHETERIZATION (CUSTOM PROCEDURE TRAY) ×1 IMPLANT
SET ATX-X65L (MISCELLANEOUS) IMPLANT
SHEATH PROBE COVER 6X72 (BAG) IMPLANT

## 2024-08-21 NOTE — Interval H&P Note (Signed)
 History and Physical Interval Note:  08/21/2024 3:05 PM  Sara Mcmahon  has presented today for surgery, with the diagnosis of unstable angina.  The various methods of treatment have been discussed with the patient and family. After consideration of risks, benefits and other options for treatment, the patient has consented to  Procedure(s): LEFT HEART CATH AND CORONARY ANGIOGRAPHY (N/A) and possible coronary intervention for positive cardiac markers and NSTEMI as a surgical intervention.  The patient's history has been reviewed, patient examined, no change in status, stable for surgery.  I have reviewed the patient's chart and labs.  Questions were answered to the patient's satisfaction.     Gordy Bergamo

## 2024-08-21 NOTE — Progress Notes (Signed)
 TR band removed per MD order without difficulty.  Tegaderm and gauze placed and patient educated regarding limited use and leaving dressing in place.

## 2024-08-21 NOTE — Progress Notes (Signed)
 PHARMACY - ANTICOAGULATION CONSULT NOTE  Pharmacy Consult for heparin  Indication: chest pain/ACS  Labs: Recent Labs    08/20/24 1236 08/20/24 1507 08/20/24 1656 08/21/24 0036  HGB 12.8  --   --   --   HCT 37.5  --   --   --   PLT 293  --   --   --   APTT  --   --  26  --   LABPROT  --   --  12.7  --   INR  --   --  0.9  --   HEPARINUNFRC  --   --   --  0.34  CREATININE 0.94  --   --   --   TROPONINIHS 21* 1,682*  --   --    Assessment/Plan:  63yo female therapeutic on heparin  with initial dosing for ACS. Will continue infusion at current rate of 750 units/hr and confirm stable with additional level.  Marvetta Dauphin, PharmD, BCPS 08/21/2024 12:59 AM

## 2024-08-21 NOTE — H&P (View-Only) (Signed)
 Cardiology Consultation  Patient ID: ARIYAH SEDLACK MRN: [id number]; DOB: September 19, 1961  Admit date: 08/20/2024 Date of Consult: 08/21/2024  PCP:  Bertell Satterfield, MD   [facility] Providers Cardiologist:  Vishnu P Mallipeddi, MD     Patient Profile: Sara Mcmahon is a 63 y.o. female with a hx of Takotsubo cardiomyopathy, hypertension, hyperlipidemia, GERD, who is being seen 08/21/2024 for the evaluation of NSTEMI at the request of Dr. Vernon.  History of Present Illness: Sara Mcmahon has past medical history as stated above.  She presented to [facility] emergency department on 08/20/2024 with chest pain.  She reported that the chest pain was just left of her sternum, nonradiating, severe, pressure-like.  She had 2 doses of nitroglycerin  prior to arrival in the emergency department that provided mild improvement of her chest pain.  She did note that it felt like her prior episode of Takotsubo cardiomyopathy, that she experienced in 2022.   Relevant workup while at the emergency department included: troponin 21 ? 1,682, BMP stable, CBC shows stable anemia (hemoglobin 11.5), CXR showed no active disease, EKG showed sinus rhythm, HR 75, no acute ischemic changes.   She was seen over at [facility] and the decision was made to transfer to [facility] to undergo possible cardiac catheterization. She was admitted to the medicine service and cardiology was asked to see in consult.   She is currently on: ASA 81 mg daily, IV heparin , Coreg  3.125 mg BID, Lipitor 40 mg daily, PRN SL NTG.   She follows with Dr. Mallipeddi as an outpatient, last seen 05/18/2024 for follow up. She has a history of Takotsubo CM in 2016 and 2022. 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 During this cath she had catheter induced vasospasms in proxRCA that was relieved by intracoronary NTG. Her medications at time of this visit included: Toprol  12.5 mg daily, losartan  12.5 mg daily, Coreg  was stopped. She was started on Zetia   10 mg as she reported intolerance to statins in the past.   After speaking with the patient, she agrees with history stated above.  She tells me with all of the news that has been happening these past couple weeks in [place] she has been very stressed out thinking of all of it.  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  She tells me the chest pain relieved with nitroglycerin , she took 2 doses prior to arrival in the emergency department.  She tells me that it did feel similar but not completely identical to her prior history of Takotsubo.  She is not currently having any active chest pain.  She tells me that she has been compliant with her Zetia , carvedilol   She tells me that at some point someone told her about her coronary vasospasms, she denies ever being on long-acting nitrate, only has as needed sublingual nitroglycerin .  I discussed options for ischemic evaluation, while she would have preferred to hold off on anything invasive, she is open to the idea.  I will discuss her case with MD and plan for possible LHC. She is currently on IV heparin   Past Medical History:  Diagnosis Date   Cardiomyopathy (HCC)    CHF (congestive heart failure) (HCC)    Fractures    GERD (gastroesophageal reflux disease)    Hepatitis    HLD (hyperlipidemia) 10/19/2019   Hypercholesteremia    Insomnia  Takotsubo cardiomyopathy 10/19/2019   Vitiligo    Past Surgical History:  Procedure Laterality Date   CARDIAC CATHETERIZATION     CARDIAC CATHETERIZATION     CESAREAN SECTION     LEFT HEART CATH AND CORONARY ANGIOGRAPHY N/A 01/13/2021   Procedure: LEFT HEART CATH AND CORONARY ANGIOGRAPHY;  Surgeon: Mady Bruckner, MD;  Location: [facility] INVASIVE CV LAB;  Service: Cardiovascular;  Laterality: N/A;    Home Medications:  Prior to Admission medications   Medication Sig Start Date End Date Taking? Authorizing Provider   ALPRAZolam  (XANAX ) 0.5 MG tablet Take 1 tablet (0.5 mg total) by mouth at bedtime as needed for anxiety. Patient taking differently: Take 0.5 mg by mouth at bedtime. **May take 0.125mg -0.5mg  by mouth daily as needed for anxiety 05/27/21  Yes Gosrani, Nimish C, MD  aspirin  EC 81 MG EC tablet Take 1 tablet (81 mg total) by mouth daily. Swallow whole. 01/14/21  Yes Bhagat, Bhavinkumar, PA  carvedilol  (COREG ) 3.125 MG tablet Take 1 tablet (3.125 mg total) by mouth 2 (two) times daily. 05/26/24  Yes Mallipeddi, Vishnu P, MD  cholecalciferol  (VITAMIN D ) 1000 units tablet Take 2,000 Units by mouth daily.   Yes [provider]  ezetimibe  (ZETIA ) 10 MG tablet Take 10 mg by mouth every morning. 05/05/24  Yes [provider]  fluticasone  (FLONASE ) 50 MCG/ACT nasal spray Place 1 spray into both nostrils daily. Patient taking differently: Place 1 spray into both nostrils daily as needed for allergies or rhinitis. 04/16/21  Yes [provider]  LYSINE HCL PO Take 1 tablet by mouth daily.   Yes [provider]  MAGNESIUM PO Take 1 tablet by mouth daily in the afternoon.   Yes [provider]  meclizine  (ANTIVERT ) 25 MG tablet Take 1 tablet (25 mg total) by mouth 3 (three) times daily as needed for dizziness. 01/04/23  Yes Shah, Pratik D, DO  montelukast  (SINGULAIR ) 10 MG tablet Take 1 tablet (10 mg total) by mouth every other day. 05/22/21  Yes Elnor Lauraine BRAVO, NP  nitroGLYCERIN  (NITROSTAT ) 0.4 MG SL tablet Place 1 tablet (0.4 mg total) under the tongue every 5 (five) minutes as needed for chest pain. 07/08/22  Yes Jeffrie Oneil BROCKS, MD  potassium chloride  (MICRO-K ) 10 MEQ CR capsule TAKE 1 CAPSULE(10 MEQ) BY MOUTH DAILY Patient taking differently: Take 10 mEq by mouth daily. TAKE 1 CAPSULE(10 MEQ) BY MOUTH DAILY 07/31/24  Yes Mallipeddi, Vishnu P, MD  traMADol  (ULTRAM ) 50 MG tablet Take 25 mg by mouth daily as needed for severe pain (pain score 7-10). 04/03/23  Yes [provider]  Turmeric (QC TUMERIC COMPLEX PO) Take 1 tablet by mouth daily.   Yes [provider]  amoxicillin-clavulanate (AUGMENTIN) 875-125 MG tablet Take 1 tablet by mouth 2 (two) times daily. Patient not taking: Reported on 08/20/2024 08/09/24   [provider]  losartan  (COZAAR ) 25 MG tablet Take 12.5 mg by mouth daily. Patient not taking: Reported on 08/20/2024    [provider]  metoprolol  succinate (TOPROL -XL) 25 MG 24 hr tablet Take 12.5 mg by mouth daily. Patient not taking: Reported on 08/20/2024    [provider]   Scheduled Meds:  ALPRAZolam   0.5 mg Oral QHS   aspirin  EC  81 mg Oral Daily   atorvastatin   10 mg Oral Daily   carvedilol   3.125 mg Oral BID   ezetimibe   10 mg Oral Daily   nitroGLYCERIN   0.4 mg Sublingual Once   Continuous Infusions:  heparin   750 Units/hr (08/20/24 1750)   PRN Meds: acetaminophen  **OR** acetaminophen , morphine  injection, ondansetron  **OR** ondansetron  (ZOFRAN ) IV, polyethylene glycol  Allergies:    Allergies  Allergen Reactions   Omeprazole  Swelling   Pollen Extract     Trees, grass, flowers   Social History:   Social History   Socioeconomic History   Marital status: Widowed    Spouse name: Not on file   Number of children: Not on file   Years of education: Not on file   Highest education level: Not on file  Occupational History   Occupation: parttime    Comment: sitter  Tobacco Use   Smoking status: Never   Smokeless tobacco: Never  Vaping Use   Vaping status: Never Used  Substance and Sexual Activity   Alcohol use: Never    Alcohol/week: 0.0 standard drinks of alcohol   Drug use: No   Sexual activity: Not on file  Other Topics Concern   Not on file  Social History Narrative   Divorced twice.Lives alone.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   She tells me that she is on disability secondary to her cardiomyopathy.   Social Drivers of Corporate investment banker Strain: Not on file  Food  Insecurity: No Food Insecurity (08/20/2024)   Hunger Vital Sign    Worried About Running Out of Food in the Last Year: Never true    Ran Out of Food in the Last Year: Never true  Transportation Needs: No Transportation Needs (08/20/2024)   PRAPARE - Administrator, Civil Service (Medical): No    Lack of Transportation (Non-Medical): No  Physical Activity: Not on file  Stress: Not on file  Social Connections: Moderately Integrated (08/20/2024)   Social Connection and Isolation Panel    Frequency of Communication with Friends and Family: More than three times a week    Frequency of Social Gatherings with Friends and Family: Twice a week    Attends Religious Services: More than 4 times per year    Active Member of [employer] or Organizations: No    Attends Engineer, structural: 1 to 4 times per year    Marital Status: Divorced  Catering manager Violence: Not At Risk (08/20/2024)   Humiliation, Afraid, Rape, and Kick questionnaire    Fear of Current or Ex-Partner: No    Emotionally Abused: No    Physically Abused: No    Sexually Abused: No    Family History:   Family History  Problem Relation Age of Onset   Hypertension Mother    Cancer Mother    Heart disease Father    Hypertension Father     ROS:  Please see the history of present illness.  All other ROS reviewed and negative.     Physical Exam/Data: Vitals:   08/20/24 2005 08/20/24 2250 08/21/24 0300 08/21/24 0809  BP: (!) 120/90 98/63 97/64  110/69  Pulse: 86 85 79 90  Resp: 14 15 18 20   Temp: 98.2 F (36.8 C) 97.8 F (36.6 C)  97.7 F (36.5 C)  TempSrc: Oral Oral  Oral  SpO2: 99% 100% 100% 100%  Weight:      Height:        Intake/Output Summary (Last 24 hours) at 08/21/2024 1105 Last data filed at 08/21/2024 0400 Gross per 24 hour  Intake 350.83 ml  Output --  Net 350.83 ml      08/20/2024    4:55 PM 05/18/2024   10:23 AM 05/18/2023   10:55 AM  Last 3 Weights  Weight (lbs) 136 lb 12.8 oz 137 lb  12.8 oz 137 lb  Weight (kg) 62.052 kg 62.506 kg 62.143 kg     Body mass index is 23.48 kg/m.   General:  in no acute distress, resting comfortably, family present HEENT: normal Neck: no JVD Vascular: No carotid bruits; Distal pulses 2+ bilaterally Cardiac: RRR; no murmur  Lungs:  clear to auscultation bilaterally Abd: soft, nontender, no hepatomegaly  Ext: no edema Musculoskeletal:  No deformities Skin: warm and dry  Neuro:  no focal abnormalities noted Psych:  Normal affect   EKG:  The EKG was personally reviewed and demonstrates: Sinus rhythm, HR 75, no acute ischemic changes when compared to prior EKGs  Telemetry:  Telemetry was personally reviewed and demonstrates: Sinus rhythm, HR 80s  Relevant CV Studies:  Echocardiogram, 08/21/2024 Ordered, pending results   Echocardiogram, 03/26/2021 Left ventricular ejection fraction, by estimation, is 60 to 65% . The left ventricle has normal function. The left ventricle has no regional wall motion abnormalities. Left ventricular diastolic parameters are consistent with Grade I diastolic dysfunction ( impaired relaxation) . The average left ventricular global longitudinal strain is 19. 2 % . The global longitudinal strain is normal.  Right ventricular systolic function is normal. The right ventricular size is normal.  Left atrial size was mildly dilated.  The mitral valve is normal in structure. Mild mitral valve regurgitation. No evidence of mitral stenosis.  The aortic valve is tricuspid. Aortic valve regurgitation is not visualized. No aortic stenosis is present.  The inferior vena cava is normal in size with greater than 50% respiratory variability, suggesting right atrial pressure of 3 mmHg.  Comparison( s) : Echocardiogram done 2/ 7/ 22 showed an EF of 25- 30%  LHC, 01/13/2021 Conclusions: Mild, non-obstructive coronary artery disease with 30% proximal LAD disease as well as catheter-induced vasospasm involving the proximal RCA  that improved from 95% to 10-20% following administration of intracoronary nitroglycerin . Severely reduced left-ventricular contraction with wall motion abnormality consistent with Takotsubo variant. Normal left ventricular filling pressure.   Recommendations: Optimize goal directed medical therapy for acute systolic heart failure due to stress-induced cardiomyopathy. Medical therapy and risk factor modification to prevent progression of atherosclerotic coronary artery disease.  Laboratory Data: High Sensitivity Troponin:   Recent Labs  Lab 08/20/24 1236 08/20/24 1507  TROPONINIHS 21* 1,682*     Chemistry Recent Labs  Lab 08/20/24 1236 08/21/24 0339  NA 138 138  K 3.5 3.6  CL 104 107  CO2 20* 25  GLUCOSE 127* 115*  BUN 18 15  CREATININE 0.94 0.89  CALCIUM  9.6 8.7*  GFRNONAA >60 >60  ANIONGAP 14 6    No results for input(s): PROT, ALBUMIN, AST, ALT, ALKPHOS, BILITOT in the last 168 hours. Lipids No results for input(s): CHOL, TRIG, HDL, LABVLDL, LDLCALC, CHOLHDL in the last 168 hours.  Hematology Recent Labs  Lab 08/20/24 1236 08/21/24 0339  WBC 8.0 8.3  RBC 4.07 3.70*  HGB 12.8 11.5*  HCT 37.5 34.4*  MCV 92.1 93.0  MCH 31.4 31.1  MCHC 34.1 33.4  RDW 12.9 13.1  PLT 293 225   Thyroid  No results for input(s): TSH, FREET4 in the last 168 hours.  BNPNo results for input(s): BNP, PROBNP in the last 168 hours.  DDimer No results for input(s): DDIMER in the last 168 hours.  Radiology/Studies:  DG Chest 2 View Result Date: 08/20/2024 CLINICAL DATA:  Mid chest pain EXAM: CHEST - 2 VIEW COMPARISON:  01/14/2023 FINDINGS:  Heart size within normal limits for AP technique. The lungs appear clear. No blunting of the costophrenic angles. No significant bony findings. IMPRESSION: 1. No active cardiopulmonary disease is radiographically apparent. Electronically Signed   By: Ryan Salvage M.D.   On: 08/20/2024 15:23   Assessment and  Plan:  NSTEMI Mild nonobstructive CAD per [facility] 2022 History of Takotsubo cardiomyopathy History of catheter induced coronary vasospasms  Presented to [facility] with chest pain Described pressure like pain, 10/10, improved with NTG Troponin 21 ? 1,682  Transferred to [facility] for further workup and evaluation  LHC 01/2021: Mild nonobstructive CAD, catheter induced vasospasm EF 01/2021 25 to 30%, improved on echo 03/2021 to 60 to 65% Home meds: Coreg  3.125 mg BID, ASA 81 mg daily Had Toprol  and Losartan  on home med list, reported to not be taking  Reports no active symptoms HR and BP have been stable this admission Currently on ASA 81 mg daily Currently on carvedilol  3.125 mg twice daily Currently on IV heparin  Pending updated echocardiogram Remain NPO in preparation for cath  Discussed possible options for ischemic evaluation with patient, she is willing to proceed with cardiac catheterization   Informed Consent   Shared Decision Making/Informed Consent The risks [stroke (1 in 1000), death (1 in 1000), kidney failure [usually temporary] (1 in 500), bleeding (1 in 200), allergic reaction [possibly serious] (1 in 200)], benefits (diagnostic support and management of coronary artery disease) and alternatives of a cardiac catheterization were discussed in detail with Sara Mcmahon and she is willing to proceed.     Hyperlipidemia Home meds: Zetia  10 mg  Notes prior statin intolerance  Start Lipitor 10 mg -- patient wiling to try Restart home Zetia  10 mg daily  Check lipid panel   Per primary Chronic anemia  Hypertension  GERD  Risk Assessment/Risk Scores:    TIMI Risk Score for Unstable Angina or Non-ST Elevation MI:   The patient's TIMI risk score is 6, which indicates a 41% risk of all cause mortality, new or recurrent myocardial infarction or need for urgent revascularization in the next 14 days.     For questions or updates, please contact [facility] Please  consult [email or web address] for contact info under      Signed, Waddell DELENA Donath, PA-C  08/21/2024 11:05 AM

## 2024-08-21 NOTE — Progress Notes (Signed)
 PROGRESS NOTE    JADELYNN BOYLAN  FMW:[id number] DOB: May 27, 1961 DOA: 08/20/2024 PCP: Bertell Satterfield, MD   Brief Narrative:  Sara Mcmahon is a 63 y.o. female with medical history significant for hypertension, Takotsubo cardiomyopathy, CHF, hypertension. [facility] presented to [facility] with complaints of sudden onset of left-sided chest pain that started today while she was in church, described as an elephant sitting on her chest associated with Dizziness and nausea without vomiting.  She took 2 nitro without improvement in her symptoms.   She reports she has been under unusual stress this past week-she is trying to sell the house she and her mother stays in, and deaths reported in the news.  Upon arrival to ED, fairly hemodynamically stable Troponin 21 >> 1682.  Per cardiology recommendation, patient started on heparin  drip and transferred to [facility] for cardiac cath.  Assessment & Plan:   Principal Problem:   NSTEMI (non-ST elevated myocardial infarction) (HCC) Active Problems:   Essential hypertension, benign   CAD (coronary artery disease)  History of CAD and Takotsubo cardiomyopathy admitted with NSTEMI (non-ST elevated myocardial infarction) [facility]): ast cardiac cath 2022- Mild, non-obstructive coronary artery disease with 30% proximal LAD disease as well as catheter-induced vasospasm involving the proximal RCA that improved from 95% to 10-20% following administration of intracoronary nitroglycerin . Severely reduced left-ventricular contraction with wall motion abnormality consistent with Takotsubo variant  Presenting with chest pain, troponin 21 > 1682.  Significant pain improvement with nitro.  History of Takotsubo cardiomyopathy 2016 and again in 2022, nonobstructive CAD.  Follows with cardiologist Dr. Mallipeddi.  Transferred to [name] for cardiac cath.  Echo pending.  Management per cardiology.   Essential hypertension, benign Stable.She is on carvedilol , resume,.  Metoprolol   also listed on med list, losartan  but she is not taking these.  Anemia of chronic disease: Hemoglobin at baseline around 11 and currently at baseline.  DVT prophylaxis: Heparin    Code Status: Full Code  Family Communication: Daughter present at bedside.  Plan of care discussed with patient in length and he/she verbalized understanding and agreed with it.  Status is: Inpatient Remains inpatient appropriate because: Scheduled for cardiac cath today.   Estimated body mass index is 23.48 kg/m as calculated from the following:   Height as of this encounter: 5' 4 (1.626 m).   Weight as of this encounter: 62.1 kg.    Nutritional Assessment: Body mass index is 23.48 kg/m.SABRA Seen by dietician.  I agree with the assessment and plan as outlined below: Nutrition Status:        . Skin Assessment: I have examined the patient's skin and I agree with the wound assessment as performed by the wound care RN as outlined below:    Consultants:  Cardiology  Procedures:  As above  Antimicrobials:  Anti-infectives (From admission, onward)    None         Subjective: Patient seen and examined, daughter at the bedside.  Her chest pain has resolved, last time she had pain was at the [facility].  No other complaint and she appears to be very comfortable at the moment.  She is aware of the plans of cardiac catheter.  Objective: Vitals:   08/20/24 1830 08/20/24 2005 08/20/24 2250 08/21/24 0300  BP: 131/82 (!) 120/90 98/63 97/64   Pulse: 90 86 85 79  Resp: 18 14 15 18   Temp: 98 F (36.7 C) 98.2 F (36.8 C) 97.8 F (36.6 C)   TempSrc: Oral Oral Oral  SpO2: 95% 99% 100% 100%  Weight:      Height:        Intake/Output Summary (Last 24 hours) at 08/21/2024 0742 Last data filed at 08/21/2024 0400 Gross per 24 hour  Intake 350.83 ml  Output --  Net 350.83 ml   Filed Weights   08/20/24 1655  Weight: 62.1 kg    Examination:  General exam: Appears calm and comfortable   Respiratory system: Clear to auscultation. Respiratory effort normal. Cardiovascular system: S1 & S2 heard, RRR. No JVD, murmurs, rubs, gallops or clicks. No pedal edema. Gastrointestinal system: Abdomen is nondistended, soft and nontender. No organomegaly or masses felt. Normal bowel sounds heard. Central nervous system: Alert and oriented. No focal neurological deficits. Extremities: Symmetric 5 x 5 power. Skin: No rashes, lesions or ulcers Psychiatry: Judgement and insight appear normal. Mood & affect appropriate.    Data Reviewed: I have personally reviewed following labs and imaging studies  CBC: Recent Labs  Lab 08/20/24 1236 08/21/24 0339  WBC 8.0 8.3  HGB 12.8 11.5*  HCT 37.5 34.4*  MCV 92.1 93.0  PLT 293 225   Basic Metabolic Panel: Recent Labs  Lab 08/20/24 1236 08/21/24 0339  NA 138 138  K 3.5 3.6  CL 104 107  CO2 20* 25  GLUCOSE 127* 115*  BUN 18 15  CREATININE 0.94 0.89  CALCIUM  9.6 8.7*   GFR: Estimated Creatinine Clearance: 54.4 mL/min (by C-G formula based on SCr of 0.89 mg/dL). Liver Function Tests: No results for input(s): AST, ALT, ALKPHOS, BILITOT, PROT, ALBUMIN in the last 168 hours. No results for input(s): LIPASE, AMYLASE in the last 168 hours. No results for input(s): AMMONIA in the last 168 hours. Coagulation Profile: Recent Labs  Lab 08/20/24 1656  INR 0.9   Cardiac Enzymes: No results for input(s): CKTOTAL, CKMB, CKMBINDEX, TROPONINI in the last 168 hours. BNP (last 3 results) No results for input(s): PROBNP in the last 8760 hours. HbA1C: No results for input(s): HGBA1C in the last 72 hours. CBG: No results for input(s): GLUCAP in the last 168 hours. Lipid Profile: No results for input(s): CHOL, HDL, LDLCALC, TRIG, CHOLHDL, LDLDIRECT in the last 72 hours. Thyroid  Function Tests: No results for input(s): TSH, T4TOTAL, FREET4, T3FREE, THYROIDAB in the last 72 hours. Anemia  Panel: No results for input(s): VITAMINB12, FOLATE, FERRITIN, TIBC, IRON, RETICCTPCT in the last 72 hours. Sepsis Labs: No results for input(s): PROCALCITON, LATICACIDVEN in the last 168 hours.  No results found for this or any previous visit (from the past 240 hours).   Radiology Studies: DG Chest 2 View Result Date: 08/20/2024 CLINICAL DATA:  Mid chest pain EXAM: CHEST - 2 VIEW COMPARISON:  01/14/2023 FINDINGS: Heart size within normal limits for AP technique. The lungs appear clear. No blunting of the costophrenic angles. No significant bony findings. IMPRESSION: 1. No active cardiopulmonary disease is radiographically apparent. Electronically Signed   By: Ryan Salvage M.D.   On: 08/20/2024 15:23    Scheduled Meds:  ALPRAZolam   0.5 mg Oral QHS   aspirin  EC  81 mg Oral Daily   atorvastatin   40 mg Oral Daily   carvedilol   3.125 mg Oral BID   nitroGLYCERIN   0.4 mg Sublingual Once   Continuous Infusions:  heparin  750 Units/hr (08/20/24 1750)     LOS: 1 day   Fredia Skeeter, MD [facility]  08/21/2024, 7:42 AM   *Please note that this is a verbal dictation therefore any spelling or grammatical errors are due to  the [employer] system interpretation.  Please page via Amion and do not message via secure chat for urgent patient care matters. Secure chat can be used for non urgent patient care matters.  How to contact the [facility] Attending or Consulting provider 7A - 7P or covering provider during after hours 7P -7A, for this patient?  Check the care team in [facility] and look for a) attending/consulting [facility] provider listed and b) the [facility] team listed. Page or secure chat 7A-7P. Log into [email or web address] and use [employer] universal password to access. If you do not have the password, please contact the hospital operator. Locate the [facility] provider you are looking for under [facility] and page to a number that you can be directly reached. If you still have  difficulty reaching the provider, please page the [facility] (Director on Call) for the Hospitalists listed on amion for assistance.

## 2024-08-21 NOTE — Consult Note (Signed)
 Cardiology Consultation  Patient ID: Sara Mcmahon MRN: [id number]; DOB: September 19, 1961  Admit date: 08/20/2024 Date of Consult: 08/21/2024  PCP:  Bertell Satterfield, MD   [facility] Providers Cardiologist:  Vishnu P Mallipeddi, MD     Patient Profile: Sara Mcmahon is a 63 y.o. female with a hx of Takotsubo cardiomyopathy, hypertension, hyperlipidemia, GERD, who is being seen 08/21/2024 for the evaluation of NSTEMI at the request of Dr. Vernon.  History of Present Illness: Sara Mcmahon has past medical history as stated above.  She presented to [facility] emergency department on 08/20/2024 with chest pain.  She reported that the chest pain was just left of her sternum, nonradiating, severe, pressure-like.  She had 2 doses of nitroglycerin  prior to arrival in the emergency department that provided mild improvement of her chest pain.  She did note that it felt like her prior episode of Takotsubo cardiomyopathy, that she experienced in 2022.   Relevant workup while at the emergency department included: troponin 21 ? 1,682, BMP stable, CBC shows stable anemia (hemoglobin 11.5), CXR showed no active disease, EKG showed sinus rhythm, HR 75, no acute ischemic changes.   She was seen over at [facility] and the decision was made to transfer to [facility] to undergo possible cardiac catheterization. She was admitted to the medicine service and cardiology was asked to see in consult.   She is currently on: ASA 81 mg daily, IV heparin , Coreg  3.125 mg BID, Lipitor 40 mg daily, PRN SL NTG.   She follows with Dr. Mallipeddi as an outpatient, last seen 05/18/2024 for follow up. She has a history of Takotsubo CM in 2016 and 2022. 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 During this cath she had catheter induced vasospasms in proxRCA that was relieved by intracoronary NTG. Her medications at time of this visit included: Toprol  12.5 mg daily, losartan  12.5 mg daily, Coreg  was stopped. She was started on Zetia   10 mg as she reported intolerance to statins in the past.   After speaking with the patient, she agrees with history stated above.  She tells me with all of the news that has been happening these past couple weeks in [place] she has been very stressed out thinking of all of it.  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  She tells me the chest pain relieved with nitroglycerin , she took 2 doses prior to arrival in the emergency department.  She tells me that it did feel similar but not completely identical to her prior history of Takotsubo.  She is not currently having any active chest pain.  She tells me that she has been compliant with her Zetia , carvedilol   She tells me that at some point someone told her about her coronary vasospasms, she denies ever being on long-acting nitrate, only has as needed sublingual nitroglycerin .  I discussed options for ischemic evaluation, while she would have preferred to hold off on anything invasive, she is open to the idea.  I will discuss her case with MD and plan for possible LHC. She is currently on IV heparin   Past Medical History:  Diagnosis Date   Cardiomyopathy (HCC)    CHF (congestive heart failure) (HCC)    Fractures    GERD (gastroesophageal reflux disease)    Hepatitis    HLD (hyperlipidemia) 10/19/2019   Hypercholesteremia    Insomnia  Takotsubo cardiomyopathy 10/19/2019   Vitiligo    Past Surgical History:  Procedure Laterality Date   CARDIAC CATHETERIZATION     CARDIAC CATHETERIZATION     CESAREAN SECTION     LEFT HEART CATH AND CORONARY ANGIOGRAPHY N/A 01/13/2021   Procedure: LEFT HEART CATH AND CORONARY ANGIOGRAPHY;  Surgeon: Mady Bruckner, MD;  Location: [facility] INVASIVE CV LAB;  Service: Cardiovascular;  Laterality: N/A;    Home Medications:  Prior to Admission medications   Medication Sig Start Date End Date Taking? Authorizing Provider   ALPRAZolam  (XANAX ) 0.5 MG tablet Take 1 tablet (0.5 mg total) by mouth at bedtime as needed for anxiety. Patient taking differently: Take 0.5 mg by mouth at bedtime. **May take 0.125mg -0.5mg  by mouth daily as needed for anxiety 05/27/21  Yes Gosrani, Nimish C, MD  aspirin  EC 81 MG EC tablet Take 1 tablet (81 mg total) by mouth daily. Swallow whole. 01/14/21  Yes Bhagat, Bhavinkumar, PA  carvedilol  (COREG ) 3.125 MG tablet Take 1 tablet (3.125 mg total) by mouth 2 (two) times daily. 05/26/24  Yes Mallipeddi, Vishnu P, MD  cholecalciferol  (VITAMIN D ) 1000 units tablet Take 2,000 Units by mouth daily.   Yes [provider]  ezetimibe  (ZETIA ) 10 MG tablet Take 10 mg by mouth every morning. 05/05/24  Yes [provider]  fluticasone  (FLONASE ) 50 MCG/ACT nasal spray Place 1 spray into both nostrils daily. Patient taking differently: Place 1 spray into both nostrils daily as needed for allergies or rhinitis. 04/16/21  Yes [provider]  LYSINE HCL PO Take 1 tablet by mouth daily.   Yes [provider]  MAGNESIUM PO Take 1 tablet by mouth daily in the afternoon.   Yes [provider]  meclizine  (ANTIVERT ) 25 MG tablet Take 1 tablet (25 mg total) by mouth 3 (three) times daily as needed for dizziness. 01/04/23  Yes Shah, Pratik D, DO  montelukast  (SINGULAIR ) 10 MG tablet Take 1 tablet (10 mg total) by mouth every other day. 05/22/21  Yes Elnor Lauraine BRAVO, NP  nitroGLYCERIN  (NITROSTAT ) 0.4 MG SL tablet Place 1 tablet (0.4 mg total) under the tongue every 5 (five) minutes as needed for chest pain. 07/08/22  Yes Jeffrie Oneil BROCKS, MD  potassium chloride  (MICRO-K ) 10 MEQ CR capsule TAKE 1 CAPSULE(10 MEQ) BY MOUTH DAILY Patient taking differently: Take 10 mEq by mouth daily. TAKE 1 CAPSULE(10 MEQ) BY MOUTH DAILY 07/31/24  Yes Mallipeddi, Vishnu P, MD  traMADol  (ULTRAM ) 50 MG tablet Take 25 mg by mouth daily as needed for severe pain (pain score 7-10). 04/03/23  Yes [provider]  Turmeric (QC TUMERIC COMPLEX PO) Take 1 tablet by mouth daily.   Yes [provider]  amoxicillin-clavulanate (AUGMENTIN) 875-125 MG tablet Take 1 tablet by mouth 2 (two) times daily. Patient not taking: Reported on 08/20/2024 08/09/24   [provider]  losartan  (COZAAR ) 25 MG tablet Take 12.5 mg by mouth daily. Patient not taking: Reported on 08/20/2024    [provider]  metoprolol  succinate (TOPROL -XL) 25 MG 24 hr tablet Take 12.5 mg by mouth daily. Patient not taking: Reported on 08/20/2024    [provider]   Scheduled Meds:  ALPRAZolam   0.5 mg Oral QHS   aspirin  EC  81 mg Oral Daily   atorvastatin   10 mg Oral Daily   carvedilol   3.125 mg Oral BID   ezetimibe   10 mg Oral Daily   nitroGLYCERIN   0.4 mg Sublingual Once   Continuous Infusions:  heparin   750 Units/hr (08/20/24 1750)   PRN Meds: acetaminophen  **OR** acetaminophen , morphine  injection, ondansetron  **OR** ondansetron  (ZOFRAN ) IV, polyethylene glycol  Allergies:    Allergies  Allergen Reactions   Omeprazole  Swelling   Pollen Extract     Trees, grass, flowers   Social History:   Social History   Socioeconomic History   Marital status: Widowed    Spouse name: Not on file   Number of children: Not on file   Years of education: Not on file   Highest education level: Not on file  Occupational History   Occupation: parttime    Comment: sitter  Tobacco Use   Smoking status: Never   Smokeless tobacco: Never  Vaping Use   Vaping status: Never Used  Substance and Sexual Activity   Alcohol use: Never    Alcohol/week: 0.0 standard drinks of alcohol   Drug use: No   Sexual activity: Not on file  Other Topics Concern   Not on file  Social History Narrative   Divorced twice.Lives alone.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   She tells me that she is on disability secondary to her cardiomyopathy.   Social Drivers of Corporate investment banker Strain: Not on file  Food  Insecurity: No Food Insecurity (08/20/2024)   Hunger Vital Sign    Worried About Running Out of Food in the Last Year: Never true    Ran Out of Food in the Last Year: Never true  Transportation Needs: No Transportation Needs (08/20/2024)   PRAPARE - Administrator, Civil Service (Medical): No    Lack of Transportation (Non-Medical): No  Physical Activity: Not on file  Stress: Not on file  Social Connections: Moderately Integrated (08/20/2024)   Social Connection and Isolation Panel    Frequency of Communication with Friends and Family: More than three times a week    Frequency of Social Gatherings with Friends and Family: Twice a week    Attends Religious Services: More than 4 times per year    Active Member of [employer] or Organizations: No    Attends Engineer, structural: 1 to 4 times per year    Marital Status: Divorced  Catering manager Violence: Not At Risk (08/20/2024)   Humiliation, Afraid, Rape, and Kick questionnaire    Fear of Current or Ex-Partner: No    Emotionally Abused: No    Physically Abused: No    Sexually Abused: No    Family History:   Family History  Problem Relation Age of Onset   Hypertension Mother    Cancer Mother    Heart disease Father    Hypertension Father     ROS:  Please see the history of present illness.  All other ROS reviewed and negative.     Physical Exam/Data: Vitals:   08/20/24 2005 08/20/24 2250 08/21/24 0300 08/21/24 0809  BP: (!) 120/90 98/63 97/64  110/69  Pulse: 86 85 79 90  Resp: 14 15 18 20   Temp: 98.2 F (36.8 C) 97.8 F (36.6 C)  97.7 F (36.5 C)  TempSrc: Oral Oral  Oral  SpO2: 99% 100% 100% 100%  Weight:      Height:        Intake/Output Summary (Last 24 hours) at 08/21/2024 1105 Last data filed at 08/21/2024 0400 Gross per 24 hour  Intake 350.83 ml  Output --  Net 350.83 ml      08/20/2024    4:55 PM 05/18/2024   10:23 AM 05/18/2023   10:55 AM  Last 3 Weights  Weight (lbs) 136 lb 12.8 oz 137 lb  12.8 oz 137 lb  Weight (kg) 62.052 kg 62.506 kg 62.143 kg     Body mass index is 23.48 kg/m.   General:  in no acute distress, resting comfortably, family present HEENT: normal Neck: no JVD Vascular: No carotid bruits; Distal pulses 2+ bilaterally Cardiac: RRR; no murmur  Lungs:  clear to auscultation bilaterally Abd: soft, nontender, no hepatomegaly  Ext: no edema Musculoskeletal:  No deformities Skin: warm and dry  Neuro:  no focal abnormalities noted Psych:  Normal affect   EKG:  The EKG was personally reviewed and demonstrates: Sinus rhythm, HR 75, no acute ischemic changes when compared to prior EKGs  Telemetry:  Telemetry was personally reviewed and demonstrates: Sinus rhythm, HR 80s  Relevant CV Studies:  Echocardiogram, 08/21/2024 Ordered, pending results   Echocardiogram, 03/26/2021 Left ventricular ejection fraction, by estimation, is 60 to 65% . The left ventricle has normal function. The left ventricle has no regional wall motion abnormalities. Left ventricular diastolic parameters are consistent with Grade I diastolic dysfunction ( impaired relaxation) . The average left ventricular global longitudinal strain is 19. 2 % . The global longitudinal strain is normal.  Right ventricular systolic function is normal. The right ventricular size is normal.  Left atrial size was mildly dilated.  The mitral valve is normal in structure. Mild mitral valve regurgitation. No evidence of mitral stenosis.  The aortic valve is tricuspid. Aortic valve regurgitation is not visualized. No aortic stenosis is present.  The inferior vena cava is normal in size with greater than 50% respiratory variability, suggesting right atrial pressure of 3 mmHg.  Comparison( s) : Echocardiogram done 2/ 7/ 22 showed an EF of 25- 30%  LHC, 01/13/2021 Conclusions: Mild, non-obstructive coronary artery disease with 30% proximal LAD disease as well as catheter-induced vasospasm involving the proximal RCA  that improved from 95% to 10-20% following administration of intracoronary nitroglycerin . Severely reduced left-ventricular contraction with wall motion abnormality consistent with Takotsubo variant. Normal left ventricular filling pressure.   Recommendations: Optimize goal directed medical therapy for acute systolic heart failure due to stress-induced cardiomyopathy. Medical therapy and risk factor modification to prevent progression of atherosclerotic coronary artery disease.  Laboratory Data: High Sensitivity Troponin:   Recent Labs  Lab 08/20/24 1236 08/20/24 1507  TROPONINIHS 21* 1,682*     Chemistry Recent Labs  Lab 08/20/24 1236 08/21/24 0339  NA 138 138  K 3.5 3.6  CL 104 107  CO2 20* 25  GLUCOSE 127* 115*  BUN 18 15  CREATININE 0.94 0.89  CALCIUM  9.6 8.7*  GFRNONAA >60 >60  ANIONGAP 14 6    No results for input(s): PROT, ALBUMIN, AST, ALT, ALKPHOS, BILITOT in the last 168 hours. Lipids No results for input(s): CHOL, TRIG, HDL, LABVLDL, LDLCALC, CHOLHDL in the last 168 hours.  Hematology Recent Labs  Lab 08/20/24 1236 08/21/24 0339  WBC 8.0 8.3  RBC 4.07 3.70*  HGB 12.8 11.5*  HCT 37.5 34.4*  MCV 92.1 93.0  MCH 31.4 31.1  MCHC 34.1 33.4  RDW 12.9 13.1  PLT 293 225   Thyroid  No results for input(s): TSH, FREET4 in the last 168 hours.  BNPNo results for input(s): BNP, PROBNP in the last 168 hours.  DDimer No results for input(s): DDIMER in the last 168 hours.  Radiology/Studies:  DG Chest 2 View Result Date: 08/20/2024 CLINICAL DATA:  Mid chest pain EXAM: CHEST - 2 VIEW COMPARISON:  01/14/2023 FINDINGS:  Heart size within normal limits for AP technique. The lungs appear clear. No blunting of the costophrenic angles. No significant bony findings. IMPRESSION: 1. No active cardiopulmonary disease is radiographically apparent. Electronically Signed   By: Ryan Salvage M.D.   On: 08/20/2024 15:23   Assessment and  Plan:  NSTEMI Mild nonobstructive CAD per [facility] 2022 History of Takotsubo cardiomyopathy History of catheter induced coronary vasospasms  Presented to [facility] with chest pain Described pressure like pain, 10/10, improved with NTG Troponin 21 ? 1,682  Transferred to [facility] for further workup and evaluation  LHC 01/2021: Mild nonobstructive CAD, catheter induced vasospasm EF 01/2021 25 to 30%, improved on echo 03/2021 to 60 to 65% Home meds: Coreg  3.125 mg BID, ASA 81 mg daily Had Toprol  and Losartan  on home med list, reported to not be taking  Reports no active symptoms HR and BP have been stable this admission Currently on ASA 81 mg daily Currently on carvedilol  3.125 mg twice daily Currently on IV heparin  Pending updated echocardiogram Remain NPO in preparation for cath  Discussed possible options for ischemic evaluation with patient, she is willing to proceed with cardiac catheterization   Informed Consent   Shared Decision Making/Informed Consent The risks [stroke (1 in 1000), death (1 in 1000), kidney failure [usually temporary] (1 in 500), bleeding (1 in 200), allergic reaction [possibly serious] (1 in 200)], benefits (diagnostic support and management of coronary artery disease) and alternatives of a cardiac catheterization were discussed in detail with Sara Mcmahon and she is willing to proceed.     Hyperlipidemia Home meds: Zetia  10 mg  Notes prior statin intolerance  Start Lipitor 10 mg -- patient wiling to try Restart home Zetia  10 mg daily  Check lipid panel   Per primary Chronic anemia  Hypertension  GERD  Risk Assessment/Risk Scores:    TIMI Risk Score for Unstable Angina or Non-ST Elevation MI:   The patient's TIMI risk score is 6, which indicates a 41% risk of all cause mortality, new or recurrent myocardial infarction or need for urgent revascularization in the next 14 days.     For questions or updates, please contact [facility] Please  consult [email or web address] for contact info under      Signed, Waddell DELENA Donath, PA-C  08/21/2024 11:05 AM

## 2024-08-22 ENCOUNTER — Encounter (HOSPITAL_COMMUNITY): Payer: Self-pay | Admitting: Internal Medicine

## 2024-08-22 ENCOUNTER — Telehealth (HOSPITAL_COMMUNITY): Payer: Self-pay | Admitting: Pharmacy Technician

## 2024-08-22 ENCOUNTER — Other Ambulatory Visit (HOSPITAL_COMMUNITY): Payer: Self-pay

## 2024-08-22 DIAGNOSIS — I214 Non-ST elevation (NSTEMI) myocardial infarction: Secondary | ICD-10-CM | POA: Diagnosis not present

## 2024-08-22 LAB — BASIC METABOLIC PANEL WITH GFR
Anion gap: 7 (ref 5–15)
BUN: 16 mg/dL (ref 8–23)
CO2: 25 mmol/L (ref 22–32)
Calcium: 8.8 mg/dL — ABNORMAL LOW (ref 8.9–10.3)
Chloride: 106 mmol/L (ref 98–111)
Creatinine, Ser: 0.98 mg/dL (ref 0.44–1.00)
GFR, Estimated: >60 mL/min (ref >60)
Glucose, Bld: 103 mg/dL — ABNORMAL HIGH (ref 70–99)
Potassium: 4 mmol/L (ref 3.5–5.1)
Sodium: 138 mmol/L (ref 135–145)

## 2024-08-22 LAB — LACTIC ACID, PLASMA: Lactic Acid, Venous: 0.5 mmol/L (ref 0.5–1.9)

## 2024-08-22 MED ORDER — SODIUM CHLORIDE 0.9 % IV BOLUS
500.0000 mL | Freq: Once | INTRAVENOUS | Status: AC
  Administered 2024-08-22: 500 mL via INTRAVENOUS

## 2024-08-22 MED ORDER — NITROGLYCERIN 0.4 MG SL SUBL
SUBLINGUAL_TABLET | SUBLINGUAL | Status: AC
  Filled 2024-08-22: qty 1

## 2024-08-22 MED ORDER — NITROGLYCERIN 0.4 MG SL SUBL
0.4000 mg | SUBLINGUAL_TABLET | SUBLINGUAL | Status: AC
  Administered 2024-08-22: 0.4 mg via SUBLINGUAL

## 2024-08-22 NOTE — Progress Notes (Signed)
 PROGRESS NOTE    Sara Mcmahon  FMW:[id number] DOB: 1961/07/09 DOA: 08/20/2024 PCP: Bertell Satterfield, MD   Brief Narrative:  Sara Mcmahon is a 63 y.o. female with medical history significant for hypertension, Takotsubo cardiomyopathy, CHF, hypertension. Patient presented to the [facility] ED with complaints of sudden onset of left-sided chest pain that started today while she was in church, described as an elephant sitting on her chest associated with Dizziness and nausea without vomiting.  She took 2 nitro without improvement in her symptoms.   She reports she has been under unusual stress this past week-she is trying to sell the house she and her mother stays in, and deaths reported in the news.  Upon arrival to ED, fairly hemodynamically stable Troponin 21 >> 1682.  Per cardiology recommendation, patient started on heparin  drip and transferred to [facility] for cardiac cath.  Assessment & Plan:   Principal Problem:   NSTEMI (non-ST elevated myocardial infarction) (HCC) Active Problems:   Essential hypertension, benign   Hypercholesterolemia   CAD (coronary artery disease)  History of CAD and Takotsubo cardiomyopathy admitted with NSTEMI (non-ST elevated myocardial infarction) [facility]): ast cardiac cath 2022- Mild, non-obstructive coronary artery disease with 30% proximal LAD disease as well as catheter-induced vasospasm involving the proximal RCA that improved from 95% to 10-20% following administration of intracoronary nitroglycerin . Severely reduced left-ventricular contraction with wall motion abnormality consistent with Takotsubo variant  Presenting with chest pain, troponin 21 > 1682.  Significant pain improvement with nitro.  History of Takotsubo cardiomyopathy 2016 and again in 2022, nonobstructive CAD.  Follows with cardiologist Dr. Mallipeddi.  Transferred to [name] for cardiac cath which was completed 08/21/2024 revealing moderate coronary artery disease and diagonal disease however  wall motion abnormality out of proportion to LAD stenosis and D1 stenosis hence presentation was most consistent with Takotsubo cardiomyopathy.  Patient had chest pain overnight and again when I saw her this morning.  Her pain was 4 out of 10.  She denied any shortness of breath.  Per cardiology, this is likely noncardiac and musculoskeletal pain.  Patient is on Coreg , she has now been started on losartan  and Aldactone .  Blood pressure is soft.  Cardiology recommends observing overnight and possibly transitioning her to GTP with Entresto instead of losartan  if blood pressure holds up.   Essential hypertension, benign Stable.She is on carvedilol , and has now been started on losartan  and Aldactone .  Anemia of chronic disease: Hemoglobin at baseline around 11 and currently at baseline.  DVT prophylaxis: Heparin    Code Status: Full Code  Family Communication: None present at bedside.  Plan of care discussed with patient in length and he/she verbalized understanding and agreed with it.  Status is: Inpatient Remains inpatient appropriate because: Cardiology recommends to keep for medication adjustment.   Estimated body mass index is 23.48 kg/m as calculated from the following:   Height as of this encounter: 5' 4 (1.626 m).   Weight as of this encounter: 62.1 kg.    Nutritional Assessment: Body mass index is 23.48 kg/m.Sara Mcmahon Seen by dietician.  I agree with the assessment and plan as outlined below: Nutrition Status:        . Skin Assessment: I have examined the patient's skin and I agree with the wound assessment as performed by the wound care RN as outlined below:    Consultants:  Cardiology  Procedures:  As above  Antimicrobials:  Anti-infectives (From admission, onward)    None  Subjective: Patient seen and examined earlier today, she was having chest pain 4 out of 10 when I saw her.  Denied any shortness of breath, palpitation or any other complaint.  Nurse was  getting the EKG.  Objective: Vitals:   08/22/24 0911 08/22/24 0942 08/22/24 1029 08/22/24 1200  BP: 123/73 102/67 105/63 101/69  Pulse: 77 74 80 72  Resp: 15 16 19 10   Temp:    97.9 F (36.6 C)  TempSrc:    Oral  SpO2: 100% 97%  99%  Weight:      Height:        Intake/Output Summary (Last 24 hours) at 08/22/2024 1245 Last data filed at 08/22/2024 0227 Gross per 24 hour  Intake 164.32 ml  Output --  Net 164.32 ml   Filed Weights   08/20/24 1655  Weight: 62.1 kg    Examination:  General exam: Appears calm and comfortable  Respiratory system: Clear to auscultation. Respiratory effort normal. Cardiovascular system: S1 & S2 heard, RRR. No JVD, murmurs, rubs, gallops or clicks. No pedal edema. Gastrointestinal system: Abdomen is nondistended, soft and nontender. No organomegaly or masses felt. Normal bowel sounds heard. Central nervous system: Alert and oriented. No focal neurological deficits. Extremities: Symmetric 5 x 5 power. Skin: No rashes, lesions or ulcers.  Psychiatry: Judgement and insight appear normal. Mood & affect appropriate.   Data Reviewed: I have personally reviewed following labs and imaging studies  CBC: Recent Labs  Lab 08/20/24 1236 08/21/24 0339  WBC 8.0 8.3  HGB 12.8 11.5*  HCT 37.5 34.4*  MCV 92.1 93.0  PLT 293 225   Basic Metabolic Panel: Recent Labs  Lab 08/20/24 1236 08/21/24 0339 08/22/24 0452  NA 138 138 138  K 3.5 3.6 4.0  CL 104 107 106  CO2 20* 25 25  GLUCOSE 127* 115* 103*  BUN 18 15 16   CREATININE 0.94 0.89 0.98  CALCIUM  9.6 8.7* 8.8*   GFR: Estimated Creatinine Clearance: 49.4 mL/min (by C-G formula based on SCr of 0.98 mg/dL). Liver Function Tests: No results for input(s): AST, ALT, ALKPHOS, BILITOT, PROT, ALBUMIN in the last 168 hours. No results for input(s): LIPASE, AMYLASE in the last 168 hours. No results for input(s): AMMONIA in the last 168 hours. Coagulation Profile: Recent Labs  Lab  08/20/24 1656  INR 0.9   Cardiac Enzymes: No results for input(s): CKTOTAL, CKMB, CKMBINDEX, TROPONINI in the last 168 hours. BNP (last 3 results) No results for input(s): PROBNP in the last 8760 hours. HbA1C: Recent Labs    08/21/24 0339  HGBA1C 5.2   CBG: No results for input(s): GLUCAP in the last 168 hours. Lipid Profile: Recent Labs    08/21/24 0339  CHOL 165  HDL 39*  LDLCALC 102*  TRIG 119  CHOLHDL 4.2   Thyroid  Function Tests: No results for input(s): TSH, T4TOTAL, FREET4, T3FREE, THYROIDAB in the last 72 hours. Anemia Panel: No results for input(s): VITAMINB12, FOLATE, FERRITIN, TIBC, IRON, RETICCTPCT in the last 72 hours. Sepsis Labs: Recent Labs  Lab 08/22/24 0452  LATICACIDVEN 0.5    No results found for this or any previous visit (from the past 240 hours).   Radiology Studies: CARDIAC CATHETERIZATION Result Date: 08/21/2024 Images from the original result were not included. Cardiac Catheterization 08/21/24: Hemodynamic data: LV: 108/13, EDP 23 mmHg.  Ao 117/56, mean 83 mmHg.  There is no pressure gradient across the aortic valve. Angiographic data: LM: Large-caliber vessel, smooth and normal. LAD: Large vessel, has mild to moderate  disease in the proximal segment constricting about 30% tandem stenosis.  Mid LAD has haziness.  Slightly progressed coronary atherosclerosis compared to 2022.  There is a large area distribution D1 and moderate area distribution due to however 1.5 to 2 mm vessels with ostial 85% and a 50% stenosis in the ostium respectively.  Mid to distal LAD is relatively disease-free. LCx: Large vessel, gives origin to large OM1 and moderate-sized OM 3, smooth and normal. RCA: Dominant vessel.  Mid segment has a 20 to 30% stenosis. LV: Mid to distal anterolateral akinesis.  There is relative sparing of the apex however overall the appearance is most consistent with Takotsubo cardiomyopathy however coronary artery  disease with unstable plaque in the LAD and large area distribution D1 stenosis giving wall motion abnormality is a reasonable differential diagnosis however there were no EKG abnormalities hence most consistent with Takotsubo cardiomyopathy. Impression and recommendations: 1.  Severe LV systolic dysfunction, EF 30 to 35% with severe entire anterolateral akinesis.  Moderately elevated EDP.  Findings most consistent with Takotsubo cardiomyopathy. 2.  Coronary artery disease without angina pectoris of the native vessel especially involving D1 and D2 and needs aggressive risk modification. I have increased the dose of Coreg  to 6.25 mg twice daily, added losartan  25 mg daily and spironolactone  12.5 mg daily.  ECHOCARDIOGRAM COMPLETE Result Date: 08/21/2024    ECHOCARDIOGRAM REPORT   Patient Name:   Sara Mcmahon Date of Exam: 08/21/2024 Medical Rec #:  [id number]       Height:       64.0 in Accession #:    [id number]      Weight:       136.8 lb Date of Birth:  07/16/1961        BSA:          1.665 m Patient Age:    63 years        BP:           97/64 mmHg Patient Gender: F               HR:           71 bpm. Exam Location:  Inpatient Procedure: 2D Echo, Cardiac Doppler, Color Doppler and Intracardiac            Opacification Agent (Both Spectral and Color Flow Doppler were            utilized during procedure). Indications:    Chest Pain R07.9  History:        Patient has prior history of Echocardiogram examinations, most                 recent 01/13/2021. Cardiomyopathy and CHF. H/O Takotsubo                 cardiomyopathy, Hyperlipidemia.  Sonographer:    BERNARDA ROCKS Referring Phys: EJIROGHENE E EMOKPAE IMPRESSIONS  1. Left ventricular ejection fraction, by estimation, is 20 to 25%. The left ventricle has normal function. Left ventricular diastolic parameters are consistent with Grade I diastolic dysfunction (impaired relaxation). The mid to distal LV is severely hypokinetic consistent with possible Takotsubo  cardiomyopathy.  2. Right ventricular systolic function is normal. The right ventricular size is normal. There is normal pulmonary artery systolic pressure.  3. The mitral valve is normal in structure. Mild mitral valve regurgitation.  4. The aortic valve is normal in structure. Aortic valve regurgitation is not visualized. FINDINGS  Left Ventricle: Left ventricular ejection fraction, by estimation, is 20  to 25%. The left ventricle has normal function. The left ventricle demonstrates regional wall motion abnormalities. Definity  contrast agent was given IV to delineate the left ventricular endocardial borders. The left ventricular internal cavity size was normal in size. There is no left ventricular hypertrophy. Left ventricular diastolic parameters are consistent with Grade I diastolic dysfunction (impaired relaxation).  LV Wall Scoring: The mid and distal lateral wall, mid and distal anterior septum, entire apex, mid anterolateral segment, and mid inferoseptal segment are hypokinetic. Right Ventricle: The right ventricular size is normal. No increase in right ventricular wall thickness. Right ventricular systolic function is normal. There is normal pulmonary artery systolic pressure. Left Atrium: Left atrial size was normal in size. Right Atrium: Right atrial size was normal in size. Pericardium: There is no evidence of pericardial effusion. Mitral Valve: The mitral valve is normal in structure. Mild mitral valve regurgitation. MV peak gradient, 3.0 mmHg. The mean mitral valve gradient is 1.0 mmHg. Tricuspid Valve: The tricuspid valve is normal in structure. Tricuspid valve regurgitation is not demonstrated. Aortic Valve: The aortic valve is normal in structure. Aortic valve regurgitation is not visualized. Aortic valve mean gradient measures 2.0 mmHg. Aortic valve peak gradient measures 4.4 mmHg. Aortic valve area, by VTI measures 1.65 cm. Pulmonic Valve: The pulmonic valve was normal in structure. Pulmonic valve  regurgitation is trivial. Aorta: The aortic root is normal in size and structure. IAS/Shunts: No atrial level shunt detected by color flow Doppler. Additional Comments: A device lead is visualized in the superior vena cava and right ventricle.  LEFT VENTRICLE PLAX 2D LVIDd:         4.60 cm      Diastology LVIDs:         2.25 cm      LV e' medial:    6.85 cm/s LV PW:         0.90 cm      LV E/e' medial:  13.1 LVOT diam:     1.70 cm      LV e' lateral:   8.59 cm/s LV SV:         37           LV E/e' lateral: 10.4 LV SV Index:   22 LVOT Area:     2.27 cm  LV Volumes (MOD) LV vol d, MOD A2C: 87.2 ml LV vol d, MOD A4C: 123.0 ml LV vol s, MOD A2C: 57.6 ml LV vol s, MOD A4C: 85.2 ml LV SV MOD A2C:     29.6 ml LV SV MOD A4C:     123.0 ml LV SV MOD BP:      29.9 ml RIGHT VENTRICLE             IVC RV Basal diam:  2.50 cm     IVC diam: 1.80 cm RV S prime:     12.40 cm/s TAPSE (M-mode): 1.8 cm LEFT ATRIUM             Index        RIGHT ATRIUM          Index LA diam:        2.90 cm 1.74 cm/m   RA Area:     9.95 cm LA Vol (A2C):   36.8 ml 22.11 ml/m  RA Volume:   18.00 ml 10.81 ml/m LA Vol (A4C):   40.0 ml 24.03 ml/m LA Biplane Vol: 38.3 ml 23.01 ml/m  AORTIC VALVE  PULMONIC VALVE AV Area (Vmax):    1.74 cm     PV Vmax:       0.86 m/s AV Area (Vmean):   1.63 cm     PV Peak grad:  3.0 mmHg AV Area (VTI):     1.65 cm AV Vmax:           105.00 cm/s AV Vmean:          71.600 cm/s AV VTI:            0.224 m AV Peak Grad:      4.4 mmHg AV Mean Grad:      2.0 mmHg LVOT Vmax:         80.60 cm/s LVOT Vmean:        51.500 cm/s LVOT VTI:          0.163 m LVOT/AV VTI ratio: 0.73  AORTA Ao Root diam: 2.90 cm Ao Asc diam:  3.30 cm MITRAL VALVE MV Area (PHT): 4.26 cm     SHUNTS MV Area VTI:   1.83 cm     Systemic VTI:  0.16 m MV Peak grad:  3.0 mmHg     Systemic Diam: 1.70 cm MV Mean grad:  1.0 mmHg MV Vmax:       0.87 m/s MV Vmean:      56.7 cm/s MV Decel Time: 178 msec MV E velocity: 89.60 cm/s MV A velocity:  101.00 cm/s MV E/A ratio:  0.89 Aditya Sabharwal Electronically signed by Ria Commander Signature Date/Time: 08/21/2024/3:42:28 PM    Final    DG Chest 2 View Result Date: 08/20/2024 CLINICAL DATA:  Mid chest pain EXAM: CHEST - 2 VIEW COMPARISON:  01/14/2023 FINDINGS: Heart size within normal limits for [facility] technique. The lungs appear clear. No blunting of the costophrenic angles. No significant bony findings. IMPRESSION: 1. No active cardiopulmonary disease is radiographically apparent. Electronically Signed   By: Ryan Salvage M.D.   On: 08/20/2024 15:23    Scheduled Meds:  ALPRAZolam   0.5 mg Oral QHS   aspirin  EC  81 mg Oral Daily   atorvastatin   10 mg Oral Daily   carvedilol   6.25 mg Oral BID WC   ezetimibe   10 mg Oral Daily   losartan   25 mg Oral Daily   nitroGLYCERIN   0.4 mg Sublingual Once   spironolactone   12.5 mg Oral Daily   Continuous Infusions:  sodium chloride        LOS: 2 days   Fredia Skeeter, MD [facility]  08/22/2024, 12:45 PM   *Please note that this is a verbal dictation therefore any spelling or grammatical errors are due to the Dragon Medical One system interpretation.  Please page via Amion and do not message via secure chat for urgent patient care matters. Secure chat can be used for non urgent patient care matters.  How to contact the [facility] Attending or Consulting provider 7A - 7P or covering provider during after hours 7P -7A, for this patient?  Check the care team in [facility] and look for a) attending/consulting [facility] provider listed and b) the [facility] team listed. Page or secure chat 7A-7P. Log into [email or web address] and use 's universal password to access. If you do not have the password, please contact the hospital operator. Locate the [facility] provider you are looking for under [facility] and page to a number that you can be directly reached. If you still have difficulty reaching the provider, please page the [facility] (Director on Call) for the  Hospitalists listed on amion for assistance.

## 2024-08-22 NOTE — Progress Notes (Signed)
 Did midnight vitals. Pt's bp 79/51. Pt c/o some lightheadedness but otherwise okay.   Notified on call cardiology. 500 bolus ordered

## 2024-08-22 NOTE — Telephone Encounter (Signed)
 Patient Product/process development scientist completed.    The patient is insured through [facility]. Patient has Medicare and is not eligible for a copay card, but may be able to apply for patient assistance or Medicare RX Payment Plan (Patient Must reach out to their plan, if eligible for payment plan), if available.    Ran test claim for Entresto 24-26 mg and the current 30 day co-pay is $255.17 due to a deductible.  Will be $47.00 once deductible is met.  Ran test claim for Farxiga 10 mg and the current 30 day co-pay is $255.17 due to a deductible.  Will be $47.00 once deductible is met.  Ran test claim for Jardiance 10 mg and the current 30 day co-pay is $255.17 due to a deductible.  Will be $47.00 once deductible is met.  This test claim was processed through [facility]- copay amounts may vary at other pharmacies due to pharmacy/plan contracts, or as the patient moves through the different stages of their insurance plan.     Reyes Sharps, CPHT Pharmacy Technician III Certified Patient Advocate [facility] Patient Advocate Team Direct Number: [phone]  Fax: [phone]

## 2024-08-22 NOTE — Progress Notes (Signed)
 Received a call regarding Sara Mcmahon's blood pressure of 79/51. HR of 70 bpm. She was diagnosed with takotsubo cardiomyopathy today with an LVEF of 20% without outflow tract obstruction. Baseline blood pressure earlier today was 100/60s. GDMT regimen was intensified this evening. IVC on yesterday's echocardiogram was 1.8 cm.   Plan: - 500 ml bolus over 4 hours  - Lactate with AM labs  Merlene Blood, MD MS  Cardiology Moonlighter

## 2024-08-22 NOTE — Progress Notes (Signed)
 Heart Failure Nurse Navigator Progress Note  PCP: Bertell Satterfield, MD PCP-Cardiologist: Mallipeddi Admission Diagnosis: NSTEMI Admitted from: Home  Presentation:   Sara Mcmahon presented after church with chest pain that feels like a elephant is sitting on her chest, dizziness and shortness of breath.  Took 2 nitroglycerins, had some relief, but pain was still there. Also took a xanax  to help with her stress and anxiety. BP 126/87, HR 79, initial EKG noted subtle ST depressions in the inferior and anterolateral leads which improved on prior EKG. (Patient reports she has been under unusual stress this past week-she is trying to sell the house she and her mother stays in, and deaths reported in the news. ) CXR with out acute abnormality, Cardiac cath on 9/15 with moderate CAD and diagonal disease, wall motion abnormality out of proportion to LAD stenosis and D1 stenosis hence, most consistent with Takotsubo cardiomyopathy.   Patient was educated on the sign and symptoms of heart failure, daily weights, when to call her doctor or go to the ED. Diet/ fluid restrictions ( reports to using salt on certain foods and drinking at least 1 Dr. Nunzio a week) Continued education on taking all her medications as prescribed and attending her medical appointments. Patient voiced her concerns stating that this is the 3 rd time she has been told she had Takotsubo cardiomyopathy Patient also realizes she needs to control her stress better and maybe cut back on some of her working hours . She did verbalize her understanding of all education. A HF TOC appointment was scheduled for 09/04/2024 @ 10:30 am.   ECHO/ LVEF: 20-25% G1DD  Clinical Course:  Past Medical History:  Diagnosis Date   Cardiomyopathy [facility])    CHF (congestive heart failure) (HCC)    Fractures    GERD (gastroesophageal reflux disease)    Hepatitis    HLD (hyperlipidemia) 10/19/2019   Hypercholesteremia    Insomnia    Takotsubo  cardiomyopathy 10/19/2019   Vitiligo      Social History   Socioeconomic History   Marital status: Widowed    Spouse name: Not on file   Number of children: Not on file   Years of education: Not on file   Highest education level: Not on file  Occupational History   Occupation: parttime    Comment: sitter  Tobacco Use   Smoking status: Never   Smokeless tobacco: Never  Vaping Use   Vaping status: Never Used  Substance and Sexual Activity   Alcohol use: Never    Alcohol/week: 0.0 standard drinks of alcohol   Drug use: No   Sexual activity: Not on file  Other Topics Concern   Not on file  Social History Narrative   Divorced twice.Lives alone.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   She tells me that she is on disability secondary to her cardiomyopathy.   Social Drivers of Corporate investment banker Strain: Not on file  Food Insecurity: No Food Insecurity (08/20/2024)   Hunger Vital Sign    Worried About Running Out of Food in the Last Year: Never true    Ran Out of Food in the Last Year: Never true  Transportation Needs: No Transportation Needs (08/20/2024)   PRAPARE - Administrator, Civil Service (Medical): No    Lack of Transportation (Non-Medical): No  Physical Activity: Not on file  Stress: Not on file  Social Connections: Moderately Integrated (08/20/2024)   Social Connection and Isolation Panel    Frequency of Communication  with Friends and Family: More than three times a week    Frequency of Social Gatherings with Friends and Family: Twice a week    Attends Religious Services: More than 4 times per year    Active Member of [employer] or Organizations: No    Attends Engineer, structural: 1 to 4 times per year    Marital Status: Divorced   Education Assessment and Provision:  Detailed education and instructions provided on heart failure disease management including the following:  Signs and symptoms of Heart Failure When to call the physician Importance  of daily weights Low sodium diet Fluid restriction Medication management Anticipated future follow-up appointments  Patient education given on each of the above topics.  Patient acknowledges understanding via teach back method and acceptance of all instructions   Education Materials:  Living Better With Heart Failure Booklet, HF zone tool, & Daily Weight Tracker Tool.  Patient has scale at home: Yes Patient has pill box at home: Yes    High Risk Criteria for Readmission and/or Poor Patient Outcomes: Heart failure hospital admissions (last 6 months): 0  No Show rate: 6 %  Difficult social situation: No, lives alone  Demonstrates medication adherence: Yes Primary Language: English  Literacy level: Reading, writing, and comprehension  Barriers of Care:   Diet/ fluid restrictions ( some salt use, soda 1-2 times per week)  Daily weights  Considerations/Referrals:   Referral made to Heart Failure Pharmacist Stewardship: NA Referral made to Heart Failure CSW/NCM TOC: NA Referral made to Heart & Vascular TOC clinic: Yes, 09/04/2024 @ 10:30 am.   Items for Follow-up on DC/TOC: Continued HF education Diet/ fluid restrictions/ daily weights   Stephane Haddock, BSN, RN Heart Failure Teacher, adult education Only

## 2024-08-22 NOTE — Progress Notes (Signed)
 Called to patient's room with chest pain. 4/10 left chest pressure, non-radiating. Deies any SOB, vitals: BP 116/70, HR 83, o2 sat 97% in RA. Patient wasn't sure if she was having chest pain or indigestion. Offered patient nitroglycerin , pt stated it didn't work last time she took, and didn't want to take morphine  yet. 12 lead EKG obtained, Dr. Vernon walked by the room, 0.4 mg Nitrostat  SL x 1 given per MD order, BP was 123/73. Post Ntro x 1, BP dropped to 102/67 with headache. Patient wanted to eat something, and see before cardiology was called. Pain was 2/10. Patient wanted to wait for cardiology before taking am meds, as her pressure dropped last night needing bolus. Plan of care continues.

## 2024-08-22 NOTE — Plan of Care (Signed)
   Problem: Clinical Measurements: Goal: Will remain free from infection Outcome: Progressing Goal: Diagnostic test results will improve Outcome: Progressing

## 2024-08-22 NOTE — Progress Notes (Signed)
 Manuelita Rummer NP was notified after pt was done eating and she stated she felt little better, still had slight chest pressure. DR, Ladona was by bedside,  notified of the chest pain event, and asked if any of her medication dose is going to be changed. Per MD space out coreg , and other Bp meds Cozaar  and spirolactone for at least an hour. Hold meds for systolic pressure below 90. Plan of care continues.

## 2024-08-22 NOTE — Progress Notes (Signed)
 Patient Name: Sara Mcmahon Date of Encounter: 08/22/2024 [facility] Cardiologist: Vishnu P Mallipeddi, MD  08/20/2024 .admit Length of stay: 2  Interval Summary  .    Sara Mcmahon is a 63 y.o. Caucasian female with recurrent Takotsubo cardiomyopathy, had an episode of cardiomyopathy in 2022 again presenting with chest pain and abnormal cardiac markers with normal EKG and underwent cardiac catheterization on 08/21/2024 revealing moderate coronary artery disease and diagonal disease however wall motion abnormality out of proportion to LAD stenosis and D1 stenosis hence presentation was most consistent with Takotsubo cardiomyopathy.  Overnight she did have some chest discomfort and had low blood pressure needing IV hydration.  Complains of chest pain that is different from chest pain she presented with, focal and located in the left side of her sternum on the lower side.  It is reproducible.  No cough, no dyspnea, no PND or orthopnea.  Physical Exam    Vitals:   08/22/24 0451 08/22/24 0908 08/22/24 0911 08/22/24 0942  BP: 99/61 116/70 123/73 102/67  Pulse: 74 75 77 74  Resp: 17 19 15 16   Temp: 97.7 F (36.5 C) 97.8 F (36.6 C)    TempSrc: Oral Oral    SpO2:  100% 100% 97%  Weight:      Height:       Physical Exam Neck:     Vascular: No carotid bruit or JVD.  Cardiovascular:     Rate and Rhythm: Normal rate and regular rhythm.     Pulses: Intact distal pulses.     Heart sounds: Normal heart sounds. No murmur heard.    No gallop.  Pulmonary:     Effort: Pulmonary effort is normal.     Breath sounds: Normal breath sounds.  Abdominal:     General: Bowel sounds are normal.     Palpations: Abdomen is soft.  Musculoskeletal:     Right lower leg: No edema.     Left lower leg: No edema.        08/20/2024    4:55 PM 05/18/2024   10:23 AM 05/18/2023   10:55 AM  Last 3 Weights  Weight (lbs) 136 lb 12.8 oz 137 lb 12.8 oz 137 lb  Weight (kg) 62.052 kg 62.506 kg  62.143 kg      Labs   Lab Results  Component Value Date   NA 138 08/22/2024   K 4.0 08/22/2024   CO2 25 08/22/2024   GLUCOSE 103 (H) 08/22/2024   BUN 16 08/22/2024   CREATININE 0.98 08/22/2024   CALCIUM  8.8 (L) 08/22/2024   GFRNONAA >60 08/22/2024       Latest Ref Rng & Units 08/22/2024    4:52 AM 08/21/2024    3:39 AM 08/20/2024   12:36 PM  BMP  Glucose 70 - 99 mg/dL 896  884  872   BUN 8 - 23 mg/dL 16  15  18    Creatinine 0.44 - 1.00 mg/dL 9.01  9.10  9.05   Sodium 135 - 145 mmol/L 138  138  138   Potassium 3.5 - 5.1 mmol/L 4.0  3.6  3.5   Chloride 98 - 111 mmol/L 106  107  104   CO2 22 - 32 mmol/L 25  25  20    Calcium  8.9 - 10.3 mg/dL 8.8  8.7  9.6        Latest Ref Rng & Units 08/21/2024    3:39 AM 08/20/2024   12:36 PM 01/14/2023    8:28  PM  CBC  WBC 4.0 - 10.5 K/uL 8.3  8.0  19.6   Hemoglobin 12.0 - 15.0 g/dL 88.4  87.1  88.1   Hematocrit 36.0 - 46.0 % 34.4  37.5  35.8   Platelets 150 - 400 K/uL 225  293  326     Lab Results  Component Value Date   CHOL 165 08/21/2024   HDL 39 (L) 08/21/2024   LDLCALC 102 (H) 08/21/2024   LDLDIRECT 162.0 (H) 01/12/2021   TRIG 119 08/21/2024   CHOLHDL 4.2 08/21/2024    Lab Results  Component Value Date   TSH 1.284 01/14/2023    Lab Results  Component Value Date   HGBA1C 5.2 08/21/2024    Cardiac Panel (last 3 results) Recent Labs    08/20/24 1236 08/20/24 1507  TROPONINIHS 21* 1,682*    Intake/Output Summary (Last 24 hours) at 08/22/2024 1111 Last data filed at 08/22/2024 0227 Gross per 24 hour  Intake 664.32 ml  Output --  Net 664.32 ml    Net IO Since Admission: 1,015.15 mL [08/22/24 1111]  Tele/EKG/Cardiac studies    Telemetry: NSR, No PVC or NSVT  EKG:  EKG 08/22/2024: Normal sinus rhythm at rate of 73 bpm, diffuse ST depression with T wave inversion in the anterolateral leads suggest subendocardial ischemia versus infarct.  Prolonged QT.  Compared to 08/20/2024, ST-T changes and prolonged QT is  new.  ECHOCARDIOGRAM COMPLETE 08/21/2024  1. Left ventricular ejection fraction, by estimation, is 20 to 25%. The left ventricle has normal function. Left ventricular diastolic parameters are consistent with Grade I diastolic dysfunction (impaired relaxation). The mid to distal LV is severely hypokinetic consistent with possible Takotsubo cardiomyopathy. 2. Right ventricular systolic function is normal. The right ventricular size is normal. There is normal pulmonary artery systolic pressure. 3. The mitral valve is normal in structure. Mild mitral valve regurgitation. 4. The aortic valve is normal in structure. Aortic valve regurgitation is not visualized.  Cardiac Catheterization 08/21/24: Hemodynamic data: LV: 108/13, EDP 23 mmHg.  Ao 117/56, mean 83 mmHg.  There is no pressure gradient across the aortic valve.   Angiographic data: There is a large area distribution D1 and moderate area distribution due to however 1.5 to 2 mm vessels with ostial 85% and a 50% stenosis in the ostium respectively. Medical Rx for CAD        Impression and recommendations: 1.  Severe LV systolic dysfunction, EF 30 to 35% with severe entire anterolateral akinesis out of proportion to CAD.  Moderately elevated EDP.  Findings most consistent with Takotsubo cardiomyopathy. 2.  Coronary artery disease without angina pectoris of the native vessel especially involving D1 and D2 and needs aggressive risk modification.   Radiology   Current Meds:     Current Facility-Administered Medications:    0.9 %  sodium chloride  infusion, 250 mL, Intravenous, PRN, Primo Innis, MD   acetaminophen  (TYLENOL ) tablet 650 mg, 650 mg, Oral, Q6H PRN, 650 mg at 08/21/24 1210 **OR** acetaminophen  (TYLENOL ) suppository 650 mg, 650 mg, Rectal, Q6H PRN, Emokpae, Ejiroghene E, MD   ALPRAZolam  (XANAX ) tablet 0.5 mg, 0.5 mg, Oral, QHS, Sundil, Subrina, MD, 0.5 mg at 08/21/24 2229   aspirin  EC tablet 81 mg, 81 mg, Oral, Daily, Emokpae,  Ejiroghene E, MD, 81 mg at 08/22/24 1040   atorvastatin  (LIPITOR) tablet 10 mg, 10 mg, Oral, Daily, Deedee Lybarger, MD, 10 mg at 08/22/24 1040   carvedilol  (COREG ) tablet 6.25 mg, 6.25 mg, Oral, BID WC, Saharsh Sterling, MD, 6.25  mg at 08/22/24 1039   ezetimibe  (ZETIA ) tablet 10 mg, 10 mg, Oral, Daily, Parcells, Taylor A, PA-C, 10 mg at 08/22/24 1040   losartan  (COZAAR ) tablet 25 mg, 25 mg, Oral, Daily, Lynnetta Tom, MD, 25 mg at 08/21/24 1704   morphine  (PF) 2 MG/ML injection 2 mg, 2 mg, Intravenous, Q4H PRN, Emokpae, Ejiroghene E, MD   nitroGLYCERIN  (NITROSTAT ) SL tablet 0.4 mg, 0.4 mg, Sublingual, Once, Beatty, Celeste A, PA-C   ondansetron  (ZOFRAN ) tablet 4 mg, 4 mg, Oral, Q6H PRN **OR** ondansetron  (ZOFRAN ) injection 4 mg, 4 mg, Intravenous, Q6H PRN, Emokpae, Ejiroghene E, MD   polyethylene glycol (MIRALAX  / GLYCOLAX ) packet 17 g, 17 g, Oral, Daily PRN, Emokpae, Ejiroghene E, MD   sodium chloride  flush (NS) 0.9 % injection 3 mL, 3 mL, Intravenous, PRN, Renia Mikelson, MD   spironolactone  (ALDACTONE ) tablet 12.5 mg, 12.5 mg, Oral, Daily, Jakeb Lamping, MD, 12.5 mg at 08/21/24 1704  Assessment & Plan .     1.  Takotsubo cardiomyopathy with severe LV systolic dysfunction 2.  Coronary artery disease  of the native vessel without angina pectoris 3.  Hypercholesterolemia 4.  Primary hypertension  Recommendations: Patient is presently on carvedilol  and I have initiated her on low-dose losartan  and also spironolactone .  Blood pressure is soft hence we will try to stagger the doses.  With regard to hyperlipidemia and underlying CAD, can be treated medically, she is now started on Lipitor 10 mg along with Zetia  10 mg daily as well.  When she tolerates the above medications, we could consider transitioning her to GDP with Entresto instead of losartan .  With regard to the chest pain, clearly musculoskeletal and easily reproducible.  In spite of severe LV systolic dysfunction, fortunately she is not in any acute  decompensated heart failure.  Ambulate.  For questions or updates, please contact [facility] Please consult [email or web address] for contact info under        Signed,   Gordy Bergamo, MD, [facility] 08/22/2024, 11:11 AM [facility] [address] [place], [place] [address] Phone: [phone]. Fax:  [phone]

## 2024-08-22 NOTE — Plan of Care (Signed)

## 2024-08-23 ENCOUNTER — Other Ambulatory Visit (HOSPITAL_COMMUNITY): Payer: Self-pay

## 2024-08-23 DIAGNOSIS — I5181 Takotsubo syndrome: Secondary | ICD-10-CM

## 2024-08-23 DIAGNOSIS — I214 Non-ST elevation (NSTEMI) myocardial infarction: Secondary | ICD-10-CM | POA: Diagnosis not present

## 2024-08-23 DIAGNOSIS — E7800 Pure hypercholesterolemia, unspecified: Secondary | ICD-10-CM

## 2024-08-23 LAB — BASIC METABOLIC PANEL WITH GFR
Anion gap: 7 (ref 5–15)
BUN: 10 mg/dL (ref 8–23)
CO2: 23 mmol/L (ref 22–32)
Calcium: 9.1 mg/dL (ref 8.9–10.3)
Chloride: 109 mmol/L (ref 98–111)
Creatinine, Ser: 0.84 mg/dL (ref 0.44–1.00)
GFR, Estimated: >60 mL/min (ref >60)
Glucose, Bld: 94 mg/dL (ref 70–99)
Potassium: 3.6 mmol/L (ref 3.5–5.1)
Sodium: 139 mmol/L (ref 135–145)

## 2024-08-23 LAB — LIPOPROTEIN A (LPA): Lipoprotein (a): 215 nmol/L — ABNORMAL HIGH (ref <75.0)

## 2024-08-23 MED ORDER — ATORVASTATIN CALCIUM 10 MG PO TABS
10.0000 mg | ORAL_TABLET | Freq: Every day | ORAL | 0 refills | Status: DC
  Filled 2024-08-23: qty 90, 90d supply, fill #0

## 2024-08-23 MED ORDER — LOSARTAN POTASSIUM 25 MG PO TABS
25.0000 mg | ORAL_TABLET | Freq: Every day | ORAL | 0 refills | Status: DC
  Filled 2024-08-23: qty 90, 90d supply, fill #0

## 2024-08-23 MED ORDER — CARVEDILOL 6.25 MG PO TABS
6.2500 mg | ORAL_TABLET | Freq: Two times a day (BID) | ORAL | 0 refills | Status: DC
  Filled 2024-08-23: qty 180, 90d supply, fill #0

## 2024-08-23 MED ORDER — SPIRONOLACTONE 25 MG PO TABS
12.5000 mg | ORAL_TABLET | Freq: Every day | ORAL | 0 refills | Status: DC
  Filled 2024-08-23: qty 45, 90d supply, fill #0

## 2024-08-23 NOTE — Plan of Care (Signed)
   Problem: Clinical Measurements: Goal: Will remain free from infection Outcome: Progressing Goal: Diagnostic test results will improve Outcome: Progressing   Problem: Nutrition: Goal: Adequate nutrition will be maintained Outcome: Progressing

## 2024-08-23 NOTE — Discharge Summary (Signed)
 Physician Discharge Summary  Sara Mcmahon FMW:[id number] DOB: 07-27-1961 DOA: 08/20/2024  PCP: Bertell Satterfield, MD  Admit date: 08/20/2024 Discharge date: 08/23/2024 30 Day Unplanned Readmission Risk Score    Flowsheet Row ED to Hosp-Admission (Current) from 08/20/2024 in [facility] 4E CV SURGICAL PROGRESSIVE CARE  30 Day Unplanned Readmission Risk Score (%) 10.04 Filed at 08/23/2024 0801    This score is the patient's risk of an unplanned readmission within 30 days of being discharged (0 -100%). The score is based on dignosis, age, lab data, medications, orders, and past utilization.   Low:  0-14.9   Medium: 15-21.9   High: 22-29.9   Extreme: 30 and above          Admitted From: Home Disposition: Home  Recommendations for Outpatient Follow-up:  Follow up with PCP in 1-2 weeks Please obtain BMP/CBC in one week Follow-up with your cardiologist per the recommended time and date Please follow up with your PCP on the following pending results: Unresulted Labs (From admission, onward)    None         Home Health: None Equipment/Devices: None  Discharge Condition: Stable CODE STATUS: Full code Diet recommendation:  Diet Order             Diet Heart Room service appropriate? Yes with Assist; Fluid consistency: Thin  Diet effective now                   Subjective: Patient seen and examined, she was dressed up, sitting at the edge of the bed and ready to go home when I saw her because cardiology had seen her prior to me and had told her that she could go home.  She had no symptoms at all.  Brief/Interim Summary: Sara Mcmahon is a 63 y.o. female with medical history significant for hypertension, Takotsubo cardiomyopathy, CHF, hypertension. Patient presented to the [facility] with complaints of sudden onset of left-sided chest pain that started today while she was in church, described as an elephant sitting on her chest associated with Dizziness and nausea without vomiting.  She  took 2 nitro without improvement in her symptoms.   She reports she has been under unusual stress this past week-she is trying to sell the house she and her mother stays in, and deaths reported in the news.  Upon arrival to ED, fairly hemodynamically stable Troponin 21 >> 1682.  Per cardiology recommendation, patient started on heparin  drip and transferred to [facility] for cardiac cath.   History of CAD and Takotsubo cardiomyopathy admitted with NSTEMI (non-ST elevated myocardial infarction) (HCC): ast cardiac cath 2022- Mild, non-obstructive coronary artery disease with 30% proximal LAD disease as well as catheter-induced vasospasm involving the proximal RCA that improved from 95% to 10-20% following administration of intracoronary nitroglycerin . Severely reduced left-ventricular contraction with wall motion abnormality consistent with Takotsubo variant  Presenting with chest pain, troponin 21 > 1682.  Significant pain improvement with nitro.  History of Takotsubo cardiomyopathy 2016 and again in 2022, nonobstructive CAD.  Follows with cardiologist Dr. Mallipeddi.  Transferred to [name] for cardiac cath which was completed 08/21/2024 revealing moderate coronary artery disease and diagonal disease however wall motion abnormality out of proportion to LAD stenosis and D1 stenosis hence presentation was most consistent with Takotsubo cardiomyopathy.  Patient had chest pain after cardiac catheter ischemic Which was yesterday.  To the nitroglycerin .  She has not had any chest pain ever since.  For her chest pain, she was kept yesterday by  cardiology.  They had a low medications.  Losartan , Aldactone  increased her Coreg .  She is symptom-free today and has been cleared by cardiology for discharge.  Cardiology will adjust medications down the road outpatient.   Essential hypertension, benign Stable.She is on carvedilol , and has now been started on losartan  and Aldactone .   Anemia of chronic disease: Hemoglobin  at baseline around 11 and currently at baseline.  Discharge plan was discussed with patient and/or family member and they verbalized understanding and agreed with it.  Discharge Diagnoses:  Principal Problem:   NSTEMI (non-ST elevated myocardial infarction) (HCC) Active Problems:   Essential hypertension, benign   Hypercholesterolemia   CAD (coronary artery disease)    Discharge Instructions  Discharge Instructions     AMB Referral to [facility] Pharm-D   Complete by: As directed    Reason For Referral: Lipids   AMB referral to Phase II Cardiac Rehabilitation   Complete by: As directed    Diagnosis: Heart Failure (see criteria below if ordering Phase II)   Heart Failure Type: Chronic Systolic   After initial evaluation and assessments completed: Virtual Based Care may be provided alone or in conjunction with Phase 2 Cardiac Rehab based on patient barriers.: Yes   Intensive Cardiac Rehabilitation (ICR) [facility] location only OR Traditional Cardiac Rehabilitation (TCR) *If criteria for ICR are not met will enroll in TCR ([facility] only): Yes      Allergies as of 08/23/2024       Reactions   Omeprazole  Swelling   Pollen Extract    Trees, grass, flowers        Medication List     STOP taking these medications    amoxicillin-clavulanate 875-125 MG tablet Commonly known as: AUGMENTIN   metoprolol  succinate 25 MG 24 hr tablet Commonly known as: TOPROL -XL       TAKE these medications    ALPRAZolam  0.5 MG tablet Commonly known as: XANAX  Take 1 tablet (0.5 mg total) by mouth at bedtime as needed for anxiety. What changed:  when to take this additional instructions   aspirin  EC 81 MG tablet Take 1 tablet (81 mg total) by mouth daily. Swallow whole.   atorvastatin  10 MG tablet Commonly known as: LIPITOR Take 1 tablet (10 mg total) by mouth daily. Start taking on: August 24, 2024   carvedilol  6.25 MG tablet Commonly known as: COREG  Take 1 tablet (6.25 mg total) by  mouth 2 (two) times daily with a meal. What changed:  medication strength how much to take when to take this   cholecalciferol  1000 units tablet Commonly known as: VITAMIN D  Take 2,000 Units by mouth daily.   ezetimibe  10 MG tablet Commonly known as: ZETIA  Take 10 mg by mouth every morning.   fluticasone  50 MCG/ACT nasal spray Commonly known as: FLONASE  Place 1 spray into both nostrils daily. What changed:  when to take this reasons to take this   losartan  25 MG tablet Commonly known as: COZAAR  Take 1 tablet (25 mg total) by mouth daily. Start taking on: August 24, 2024 What changed: how much to take   LYSINE HCL PO Take 1 tablet by mouth daily.   MAGNESIUM PO Take 1 tablet by mouth daily in the afternoon.   meclizine  25 MG tablet Commonly known as: ANTIVERT  Take 1 tablet (25 mg total) by mouth 3 (three) times daily as needed for dizziness.   montelukast  10 MG tablet Commonly known as: SINGULAIR  Take 1 tablet (10 mg total) by mouth every other day.  nitroGLYCERIN  0.4 MG SL tablet Commonly known as: NITROSTAT  Place 1 tablet (0.4 mg total) under the tongue every 5 (five) minutes as needed for chest pain.   potassium chloride  10 MEQ CR capsule Commonly known as: MICRO-K  TAKE 1 CAPSULE(10 MEQ) BY MOUTH DAILY What changed:  how much to take how to take this when to take this   QC TUMERIC COMPLEX PO Take 1 tablet by mouth daily.   spironolactone  25 MG tablet Commonly known as: ALDACTONE  Take 0.5 tablets (12.5 mg total) by mouth daily. Start taking on: August 24, 2024   traMADol  50 MG tablet Commonly known as: ULTRAM  Take 25 mg by mouth daily as needed for severe pain (pain score 7-10).        Follow-up Information     [facility]. Go in 12 day(s).   Specialty: Cardiology Why: Hospital follow up 09/04/2024 @ 10:30 am PLEASE bring a current medication list to appointment FREE valet parking, Entrance C,  off [facility] entrance Contact information: [address] [place]  [address] [phone]        Bertell Satterfield, MD Follow up in 1 week(s).   Specialty: Internal Medicine Contact information: [address] [place] [address] [phone]                Allergies  Allergen Reactions   Omeprazole  Swelling   Pollen Extract     Trees, grass, flowers    Consultations: Cardiology   Procedures/Studies: CARDIAC CATHETERIZATION Result Date: 08/21/2024 Images from the original result were not included. Cardiac Catheterization 08/21/24: Hemodynamic data: LV: 108/13, EDP 23 mmHg.  Ao 117/56, mean 83 mmHg.  There is no pressure gradient across the aortic valve. Angiographic data: LM: Large-caliber vessel, smooth and normal. LAD: Large vessel, has mild to moderate disease in the proximal segment constricting about 30% tandem stenosis.  Mid LAD has haziness.  Slightly progressed coronary atherosclerosis compared to 2022.  There is a large area distribution D1 and moderate area distribution due to however 1.5 to 2 mm vessels with ostial 85% and a 50% stenosis in the ostium respectively.  Mid to distal LAD is relatively disease-free. LCx: Large vessel, gives origin to large OM1 and moderate-sized OM 3, smooth and normal. RCA: Dominant vessel.  Mid segment has a 20 to 30% stenosis. LV: Mid to distal anterolateral akinesis.  There is relative sparing of the apex however overall the appearance is most consistent with Takotsubo cardiomyopathy however coronary artery disease with unstable plaque in the LAD and large area distribution D1 stenosis giving wall motion abnormality is a reasonable differential diagnosis however there were no EKG abnormalities hence most consistent with Takotsubo cardiomyopathy. Impression and recommendations: 1.  Severe LV systolic dysfunction, EF 30 to 35% with severe entire anterolateral  akinesis.  Moderately elevated EDP.  Findings most consistent with Takotsubo cardiomyopathy. 2.  Coronary artery disease without angina pectoris of the native vessel especially involving D1 and D2 and needs aggressive risk modification. I have increased the dose of Coreg  to 6.25 mg twice daily, added losartan  25 mg daily and spironolactone  12.5 mg daily.  ECHOCARDIOGRAM COMPLETE Result Date: 08/21/2024    ECHOCARDIOGRAM REPORT   Patient Name:   Sara Mcmahon Date of Exam: 08/21/2024 Medical Rec #:  [id number]       Height:       64.0 in Accession #:    [id number]  Weight:       136.8 lb Date of Birth:  Apr 25, 1961        BSA:          1.665 m Patient Age:    63 years        BP:           97/64 mmHg Patient Gender: F               HR:           71 bpm. Exam Location:  Inpatient Procedure: 2D Echo, Cardiac Doppler, Color Doppler and Intracardiac            Opacification Agent (Both Spectral and Color Flow Doppler were            utilized during procedure). Indications:    Chest Pain R07.9  History:        Patient has prior history of Echocardiogram examinations, most                 recent 01/13/2021. Cardiomyopathy and CHF. H/O Takotsubo                 cardiomyopathy, Hyperlipidemia.  Sonographer:    BERNARDA ROCKS Referring Phys: EJIROGHENE E EMOKPAE IMPRESSIONS  1. Left ventricular ejection fraction, by estimation, is 20 to 25%. The left ventricle has normal function. Left ventricular diastolic parameters are consistent with Grade I diastolic dysfunction (impaired relaxation). The mid to distal LV is severely hypokinetic consistent with possible Takotsubo cardiomyopathy.  2. Right ventricular systolic function is normal. The right ventricular size is normal. There is normal pulmonary artery systolic pressure.  3. The mitral valve is normal in structure. Mild mitral valve regurgitation.  4. The aortic valve is normal in structure. Aortic valve regurgitation is not visualized. FINDINGS  Left Ventricle: Left  ventricular ejection fraction, by estimation, is 20 to 25%. The left ventricle has normal function. The left ventricle demonstrates regional wall motion abnormalities. Definity  contrast agent was given IV to delineate the left ventricular endocardial borders. The left ventricular internal cavity size was normal in size. There is no left ventricular hypertrophy. Left ventricular diastolic parameters are consistent with Grade I diastolic dysfunction (impaired relaxation).  LV Wall Scoring: The mid and distal lateral wall, mid and distal anterior septum, entire apex, mid anterolateral segment, and mid inferoseptal segment are hypokinetic. Right Ventricle: The right ventricular size is normal. No increase in right ventricular wall thickness. Right ventricular systolic function is normal. There is normal pulmonary artery systolic pressure. Left Atrium: Left atrial size was normal in size. Right Atrium: Right atrial size was normal in size. Pericardium: There is no evidence of pericardial effusion. Mitral Valve: The mitral valve is normal in structure. Mild mitral valve regurgitation. MV peak gradient, 3.0 mmHg. The mean mitral valve gradient is 1.0 mmHg. Tricuspid Valve: The tricuspid valve is normal in structure. Tricuspid valve regurgitation is not demonstrated. Aortic Valve: The aortic valve is normal in structure. Aortic valve regurgitation is not visualized. Aortic valve mean gradient measures 2.0 mmHg. Aortic valve peak gradient measures 4.4 mmHg. Aortic valve area, by VTI measures 1.65 cm. Pulmonic Valve: The pulmonic valve was normal in structure. Pulmonic valve regurgitation is trivial. Aorta: The aortic root is normal in size and structure. IAS/Shunts: No atrial level shunt detected by color flow Doppler. Additional Comments: A device lead is visualized in the superior vena cava and right ventricle.  LEFT VENTRICLE PLAX 2D LVIDd:  4.60 cm      Diastology LVIDs:         2.25 cm      LV e' medial:    6.85  cm/s LV PW:         0.90 cm      LV E/e' medial:  13.1 LVOT diam:     1.70 cm      LV e' lateral:   8.59 cm/s LV SV:         37           LV E/e' lateral: 10.4 LV SV Index:   22 LVOT Area:     2.27 cm  LV Volumes (MOD) LV vol d, MOD A2C: 87.2 ml LV vol d, MOD A4C: 123.0 ml LV vol s, MOD A2C: 57.6 ml LV vol s, MOD A4C: 85.2 ml LV SV MOD A2C:     29.6 ml LV SV MOD A4C:     123.0 ml LV SV MOD BP:      29.9 ml RIGHT VENTRICLE             IVC RV Basal diam:  2.50 cm     IVC diam: 1.80 cm RV S prime:     12.40 cm/s TAPSE (M-mode): 1.8 cm LEFT ATRIUM             Index        RIGHT ATRIUM          Index LA diam:        2.90 cm 1.74 cm/m   RA Area:     9.95 cm LA Vol (A2C):   36.8 ml 22.11 ml/m  RA Volume:   18.00 ml 10.81 ml/m LA Vol (A4C):   40.0 ml 24.03 ml/m LA Biplane Vol: 38.3 ml 23.01 ml/m  AORTIC VALVE                    PULMONIC VALVE AV Area (Vmax):    1.74 cm     PV Vmax:       0.86 m/s AV Area (Vmean):   1.63 cm     PV Peak grad:  3.0 mmHg AV Area (VTI):     1.65 cm AV Vmax:           105.00 cm/s AV Vmean:          71.600 cm/s AV VTI:            0.224 m AV Peak Grad:      4.4 mmHg AV Mean Grad:      2.0 mmHg LVOT Vmax:         80.60 cm/s LVOT Vmean:        51.500 cm/s LVOT VTI:          0.163 m LVOT/AV VTI ratio: 0.73  AORTA Ao Root diam: 2.90 cm Ao Asc diam:  3.30 cm MITRAL VALVE MV Area (PHT): 4.26 cm     SHUNTS MV Area VTI:   1.83 cm     Systemic VTI:  0.16 m MV Peak grad:  3.0 mmHg     Systemic Diam: 1.70 cm MV Mean grad:  1.0 mmHg MV Vmax:       0.87 m/s MV Vmean:      56.7 cm/s MV Decel Time: 178 msec MV E velocity: 89.60 cm/s MV A velocity: 101.00 cm/s MV E/A ratio:  0.89 Aditya Sabharwal Electronically signed by Ria Commander Signature Date/Time: 08/21/2024/3:42:28 PM    Final  DG Chest 2 View Result Date: 08/20/2024 CLINICAL DATA:  Mid chest pain EXAM: CHEST - 2 VIEW COMPARISON:  01/14/2023 FINDINGS: Heart size within normal limits for [facility] technique. The lungs appear clear. No blunting of  the costophrenic angles. No significant bony findings. IMPRESSION: 1. No active cardiopulmonary disease is radiographically apparent. Electronically Signed   By: Ryan Salvage M.D.   On: 08/20/2024 15:23     Discharge Exam: Vitals:   08/23/24 0401 08/23/24 0722  BP: 101/63 107/65  Pulse: 70 76  Resp: 20 18  Temp: 97.8 F (36.6 C) 97.9 F (36.6 C)  SpO2: 100% 100%   Vitals:   08/22/24 1931 08/22/24 2258 08/23/24 0401 08/23/24 0722  BP: 119/66 102/69 101/63 107/65  Pulse: 76 74 70 76  Resp: 20 20 20 18   Temp: 98.4 F (36.9 C) 98.6 F (37 C) 97.8 F (36.6 C) 97.9 F (36.6 C)  TempSrc: Oral Oral Oral Oral  SpO2: 99% 98% 100% 100%  Weight:      Height:        General: Pt is alert, awake, not in acute distress Cardiovascular: RRR, S1/S2 +, no rubs, no gallops Respiratory: CTA bilaterally, no wheezing, no rhonchi Abdominal: Soft, NT, ND, bowel sounds + Extremities: no edema, no cyanosis    The results of significant diagnostics from this hospitalization (including imaging, microbiology, ancillary and laboratory) are listed below for reference.     Microbiology: No results found for this or any previous visit (from the past 240 hours).   Labs: BNP (last 3 results) No results for input(s): BNP in the last 8760 hours. Basic Metabolic Panel: Recent Labs  Lab 08/20/24 1236 08/21/24 0339 08/22/24 0452 08/23/24 0335  NA 138 138 138 139  K 3.5 3.6 4.0 3.6  CL 104 107 106 109  CO2 20* 25 25 23   GLUCOSE 127* 115* 103* 94  BUN 18 15 16 10   CREATININE 0.94 0.89 0.98 0.84  CALCIUM  9.6 8.7* 8.8* 9.1   Liver Function Tests: No results for input(s): AST, ALT, ALKPHOS, BILITOT, PROT, ALBUMIN in the last 168 hours. No results for input(s): LIPASE, AMYLASE in the last 168 hours. No results for input(s): AMMONIA in the last 168 hours. CBC: Recent Labs  Lab 08/20/24 1236 08/21/24 0339  WBC 8.0 8.3  HGB 12.8 11.5*  HCT 37.5 34.4*  MCV 92.1 93.0   PLT 293 225   Cardiac Enzymes: No results for input(s): CKTOTAL, CKMB, CKMBINDEX, TROPONINI in the last 168 hours. BNP: Invalid input(s): POCBNP CBG: No results for input(s): GLUCAP in the last 168 hours. D-Dimer No results for input(s): DDIMER in the last 72 hours. Hgb A1c Recent Labs    08/21/24 0339  HGBA1C 5.2   Lipid Profile Recent Labs    08/21/24 0339  CHOL 165  HDL 39*  LDLCALC 102*  TRIG 119  CHOLHDL 4.2   Thyroid  function studies No results for input(s): TSH, T4TOTAL, T3FREE, THYROIDAB in the last 72 hours.  Invalid input(s): FREET3 Anemia work up No results for input(s): VITAMINB12, FOLATE, FERRITIN, TIBC, IRON, RETICCTPCT in the last 72 hours. Urinalysis    Component Value Date/Time   COLORURINE COLORLESS (A) 01/14/2023 1949   APPEARANCEUR CLEAR 01/14/2023 1949   LABSPEC 1.003 (L) 01/14/2023 1949   PHURINE 8.0 01/14/2023 1949   GLUCOSEU NEGATIVE 01/14/2023 1949   HGBUR SMALL (A) 01/14/2023 1949   BILIRUBINUR NEGATIVE 01/14/2023 1949   KETONESUR NEGATIVE 01/14/2023 1949   PROTEINUR NEGATIVE 01/14/2023 1949   UROBILINOGEN 0.2  09/11/2014 1801   NITRITE NEGATIVE 01/14/2023 1949   LEUKOCYTESUR NEGATIVE 01/14/2023 1949   Sepsis Labs Recent Labs  Lab 08/20/24 1236 08/21/24 0339  WBC 8.0 8.3   Microbiology No results found for this or any previous visit (from the past 240 hours).  FURTHER DISCHARGE INSTRUCTIONS:   Get Medicines reviewed and adjusted: Please take all your medications with you for your next visit with your Primary MD   Laboratory/radiological data: Please request your Primary MD to go over all hospital tests and procedure/radiological results at the follow up, please ask your Primary MD to get all Hospital records sent to his/her office.   In some cases, they will be blood work, cultures and biopsy results pending at the time of your discharge. Please request that your primary care M.D. goes  through all the records of your hospital data and follows up on these results.   Also Note the following: If you experience worsening of your admission symptoms, develop shortness of breath, life threatening emergency, suicidal or homicidal thoughts you must seek medical attention immediately by calling 911 or calling your MD immediately  if symptoms less severe.   You must read complete instructions/literature along with all the possible adverse reactions/side effects for all the Medicines you take and that have been prescribed to you. Take any new Medicines after you have completely understood and accpet all the possible adverse reactions/side effects.    patient was instructed, not to drive, operate heavy machinery, perform activities at heights, swimming or participation in water  activities or provide baby-sitting services while on Pain, Sleep and Anxiety Medications; until their outpatient Physician has advised to do so again. Also recommended to not to take more than prescribed Pain, Sleep and Anxiety Medications.  It is not advisable to combine anxiety, sleep and pain medications without talking with your primary care provider.     Wear Seat belts while driving.   Please note: You were cared for by a hospitalist during your hospital stay. Once you are discharged, your primary care physician will handle any further medical issues. Please note that NO REFILLS for any discharge medications will be authorized once you are discharged, as it is imperative that you return to your primary care physician (or establish a relationship with a primary care physician if you do not have one) for your post hospital discharge needs so that they can reassess your need for medications and monitor your lab values  Time coordinating discharge: Over 30 minutes  SIGNED:   Fredia Skeeter, MD  [facility] 08/23/2024, 10:26 AM *Please note that this is a verbal dictation therefore any spelling or grammatical  errors are due to the Dragon Medical One system interpretation. If 7PM-7AM, please contact night-coverage [email or web address]

## 2024-08-23 NOTE — Progress Notes (Signed)
 Pt verbalized understanding of discharge POC .  Education provided in AVS.  TOC meds/Lounge by Rosario RN Discharge.  VSS alert oriented and in good spirits.   NO TELE OR PIV at time of discharge.

## 2024-08-23 NOTE — Progress Notes (Signed)
  Progress Note  Patient Name: Sara Mcmahon Date of Encounter: 08/23/2024 [facility] Cardiologist: Vishnu P Mallipeddi, MD   Interval Summary    Feeling well this morning. No further chest pain. Ready to go home.   Vital Signs Vitals:   08/22/24 1931 08/22/24 2258 08/23/24 0401 08/23/24 0722  BP: 119/66 102/69 101/63 107/65  Pulse: 76 74 70 76  Resp: 20 20 20 18   Temp: 98.4 F (36.9 C) 98.6 F (37 C) 97.8 F (36.6 C) 97.9 F (36.6 C)  TempSrc: Oral Oral Oral Oral  SpO2: 99% 98% 100% 100%  Weight:      Height:        Intake/Output Summary (Last 24 hours) at 08/23/2024 0900 Last data filed at 08/23/2024 0235 Gross per 24 hour  Intake 0 ml  Output --  Net 0 ml      08/20/2024    4:55 PM 05/18/2024   10:23 AM 05/18/2023   10:55 AM  Last 3 Weights  Weight (lbs) 136 lb 12.8 oz 137 lb 12.8 oz 137 lb  Weight (kg) 62.052 kg 62.506 kg 62.143 kg      Telemetry/ECG   Sinus Rhythm - Personally Reviewed  Physical Exam  GEN: No acute distress.   Neck: No JVD Cardiac: RRR, no murmurs, rubs, or gallops.  Respiratory: Clear to auscultation bilaterally. GI: Soft, nontender, non-distended  MS: No edema VAS: right radial cath site stable   Assessment & Plan   63 y.o. female with a hx of Takotsubo cardiomyopathy, hypertension, hyperlipidemia, GERD, who is being seen 08/21/2024 for the evaluation of NSTEMI at the request of Dr. Vernon.   NSTEMI Takotsubo cardiomyopathy  -- Underwent cardiac cath 9/15 with 85% lesion in D1, 50% lesion in D2 with mild non-obstructive disease in the LAD. LV gram showed LVEF of 30-35% with anterolateral akinesis. LVEDP of  -- Echo 9/15 with LVEF of 20-25%, g1DD, mid to distal LV with severe hypokinesis consistent with takotsubo cardiomyopathy, mild MR -- GDMT: tolerating coreg  6.25mg  BID, losartan  25mg  daily, spiro 12.5mg  daily. BP soft but stable. Discussed adding SGLT2i but patient declines at this time. Consider transition to  Entresto as an outpatient if BP stable. Lasix  20mg  daily PRN at discharge   HTN -- coreg  6.25mg  BID, losartan  25mg  daily, spiro 12.5mg  daily  HLD -- continue atorvastatin  10mg  daily (reports previously intolerance to statin) will refer to lipid clinic   Will arrange outpatient follow up  For questions or updates, please contact [facility] Please consult [email or web address] for contact info under   Signed, Manuelita Rummer, NP

## 2024-08-23 NOTE — Progress Notes (Signed)
   08/23/24 1111  TOC Brief Assessment  Insurance and Status Reviewed  Patient has primary care physician Yes  Home environment has been reviewed home  Prior level of function: independent  Prior/Current Home Services No current home services  Social Drivers of Health Review SDOH reviewed no interventions necessary  Readmission risk has been reviewed Yes  Transition of care needs no transition of care needs at this time    Pt stable for transition home today, meds to be filled by [facility] pharmacy prior to discharge. No HH or DME needs noted.

## 2024-08-23 NOTE — Progress Notes (Addendum)
 Discharge Nurse Summary: DC order noted per MD. DC RN at bedside with patient. Patient agreeable with discharge plan, agreeable to transport to dc lounge to await TOC meds and family pickup. TOC meds delivered to bedside. CP/Edu resolved. Patient dressed and ready to go. All belongings accounted for. Patient wheeled patient downstairs to [employer].  Rosario EMERSON Lund, RN

## 2024-08-23 NOTE — Care Management Important Message (Signed)
 Important Message  Patient Details  Name: LIZBETH FEIJOO MRN: [id number] Date of Birth: 03-01-1961   Important Message Given:  Yes - Medicare IM     Vonzell Arrie Sharps 08/23/2024, 8:37 AM

## 2024-08-23 NOTE — Progress Notes (Signed)
Order received to discharge patient.  Telemetry monitor removed and CCMD notified.  PIV access removed.  Discharge instructions, follow up, medications and instructions for their use discussed with patient. 

## 2024-08-24 ENCOUNTER — Telehealth: Payer: Self-pay | Admitting: Student

## 2024-08-24 NOTE — Telephone Encounter (Signed)
   Patient called after hours line with concerns of low BP after recent discharged. Called and spoke with patient. She was discharged from the hospital after being admitted for Takotsubo cardiomyopathy. She was discharged on multiple new medications Losartan  25mg  daily, Spironolactone  12.5mg  daily, and Coreg  6.25mg  twice daily (previously on 3.125mg  twice daily). She reports feeling really tired and cold today as well as being unsteady on her feet. She also reports a headache. No significant lightheadedness/ dizziness, syncope, chest pain, shortness of breath. BP shortly before calling was 89/66. Heart rate 69 bpm. She took morning dose of medications around 8-9am this morning. Advised patient to hold evening dose of Coreg  and increase PO intake tonight and just take it easy. She states she thinks Spironolactone  has previously caused her BP to drop to low. Headache also a common side affect of Spironolactone . Recommended stopping this completely. Recommended she check her BP in the morning. OK to take Losartan  and Coreg  in the morning if systolic BP >100. Reviewed ED precautions. She already has close follow-up in the office next week. Patient voiced understanding and thanked me for calling.  Ayham Word E Heavan Francom, PA-C 08/24/2024 6:28 PM

## 2024-08-29 NOTE — Progress Notes (Unsigned)
 Cardiology Office Note    Date:  08/30/2024  ID:  Sara Mcmahon, DOB 1961-08-28, MRN [id number] Cardiologist: Vishnu P Mallipeddi, MD Cardiology APP:  Johnson Laymon HERO, PA-C { : History of Present Illness:    Sara Mcmahon is a 63 y.o. female with past medical history of recurrent Takotsubo cardiomyopathy (occurring in 2016 and 2022 with EF at 25 to 30% in 01/2021 and normalized to 60 to 65% by repeat echocardiogram in 03/2021), CAD (catheterization in 01/2021 showing mild, nonobstructive disease), HTN and HLD who presents to the office today for hospital follow-up.  She was most recently admitted to [facility] earlier this month for an NSTEMI with initial and repeat Hs Troponin values elevated at 21 and 1682. Echocardiogram showed a reduced EF of 20 to 25% with severe hypokinesis along the mid to distal LV and consistent with possible Takotsubo cardiomyopathy. RV function was normal. She underwent cardiac catheterization which showed 20% stenosis along the RCA, 30% stenosis along the proximal LAD and 85% D1 stenosis and 50% D2 stenosis but overall small vessel size. She did have severe anterolateral akinesis and moderately elevated EDP.  Findings were most consistent with Takotsubo cardiomyopathy and medical management was recommended of her CAD. Was started on GDMT with Coreg  6.25 mg twice daily, Losartan  25 mg daily and Spironolactone  25 mg daily. Options regarding adding an SGLT2 inhibitor were reviewed but the patient declined and it was recommended to consider transitioning to Entresto as an outpatient if BP allowed.  She did call the after-hours line on 08/24/2024 reporting fatigue and hypotension with BP as low as 89/66. It was recommend that she hold her evening dose of Coreg  and stop Spironolactone .   In talking with the patient today, she reports overall feeling well since her recent hospitalization. She denies any recurrent chest pain or palpitations. No specific dyspnea on exertion,  orthopnea, PND or pitting edema. She is still experiencing episodes of hypotension with associated dizziness and did stop Spironolactone . Still taking Coreg  6.25 mg twice daily and Losartan  25 mg daily at this time.  Studies Reviewed:   EKG: EKG is not ordered today.  Echocardiogram: 08/21/2024 IMPRESSIONS     1. Left ventricular ejection fraction, by estimation, is 20 to 25%. The  left ventricle has normal function. Left ventricular diastolic parameters  are consistent with Grade I diastolic dysfunction (impaired relaxation).  The mid to distal LV is severely  hypokinetic consistent with possible Takotsubo cardiomyopathy.   2. Right ventricular systolic function is normal. The right ventricular  size is normal. There is normal pulmonary artery systolic pressure.   3. The mitral valve is normal in structure. Mild mitral valve  regurgitation.   4. The aortic valve is normal in structure. Aortic valve regurgitation is  not visualized.    Cardiac Catheterization: 08/21/2024 Hemodynamic data: LV: 108/13, EDP 23 mmHg.  Ao 117/56, mean 83 mmHg.  There is no pressure gradient across the aortic valve.   Angiographic data: LM: Large-caliber vessel, smooth and normal. LAD: Large vessel, has mild to moderate disease in the proximal segment constricting about 30% tandem stenosis.  Mid LAD has haziness.  Slightly progressed coronary atherosclerosis compared to 2022.  There is a large area distribution D1 and moderate area distribution due to however 1.5 to 2 mm vessels with ostial 85% and a 50% stenosis in the ostium respectively.  Mid to distal LAD is relatively disease-free. LCx: Large vessel, gives origin to large OM1 and moderate-sized OM 3, smooth and normal.  RCA: Dominant vessel.  Mid segment has a 20 to 30% stenosis. LV: Mid to distal anterolateral akinesis.  There is relative sparing of the apex however overall the appearance is most consistent with Takotsubo cardiomyopathy however  coronary artery disease with unstable plaque in the LAD and large area distribution D1 stenosis giving wall motion abnormality is a reasonable differential diagnosis however there were no EKG abnormalities hence most consistent with Takotsubo cardiomyopathy.        Impression and recommendations: 1.  Severe LV systolic dysfunction, EF 30 to 35% with severe entire anterolateral akinesis.  Moderately elevated EDP.  Findings most consistent with Takotsubo cardiomyopathy. 2.  Coronary artery disease without angina pectoris of the native vessel especially involving D1 and D2 and needs aggressive risk modification. I have increased the dose of Coreg  to 6.25 mg twice daily, added losartan  25 mg daily and spironolactone  12.5 mg daily.    Physical Exam:   VS:  BP (!) 100/56 (BP Location: Left Arm, Cuff Size: Normal)   Pulse 76   Ht 5' 4 (1.626 m)   Wt 137 lb (62.1 kg)   SpO2 96%   BMI 23.52 kg/m    Wt Readings from Last 3 Encounters:  08/30/24 137 lb (62.1 kg)  08/20/24 136 lb 12.8 oz (62.1 kg)  05/18/24 137 lb 12.8 oz (62.5 kg)     GEN: Well nourished, well developed female appearing in no acute distress NECK: No JVD; No carotid bruits CARDIAC: RRR, no murmurs, rubs, gallops RESPIRATORY:  Clear to auscultation without rales, wheezing or rhonchi  ABDOMEN: Appears non-distended. No obvious abdominal masses. EXTREMITIES: No clubbing or cyanosis. No pitting edema.  Distal pedal pulses are 2+ bilaterally. Right radial site without ecchymosis or evidence of a hematoma.    Assessment and Plan:   1. Takotsubo cardiomyopathy - She has a history of recurrent Takotsubo cardiomyopathy with events in 2016 and 2022 with most recent being earlier this month. EF was found to be reduced at 20 to 25% and wall motion abnormalities consistent with a Takotsubo pattern. Breathing is at baseline and she denies any recurrent chest pain. - Given her dizziness and hypotension, will reduce Coreg  to 3.125 mg  twice daily. Continue Losartan  25 mg daily. If BP remains soft, would reduce Losartan  to 12.5 mg daily. Spironolactone  was stopped in the interim due to dizziness and hypotension and BP does not allow for switching from Losartan  to Entresto. Previously declined SGLT2 inhibitor therapy. - Will plan for a follow-up limited echocardiogram in 2 to 3 months for reassessment of her EF. Will arrange for follow-up after this.  2. Coronary artery disease involving native coronary artery of native heart without angina pectoris - Recent cardiac catheterization showed 20% stenosis along the RCA, 30% stenosis along the proximal LAD and 85% D1 stenosis and 50% D2 stenosis but overall small vessel size. Reviewed the report in detail with her today and medical management was initially recommended. - She denies any recent chest pain. Continue ASA 81 mg daily and Atorvastatin  10 mg daily. Will reduce Coreg  to 3.125 mg twice daily.  3. Essential hypertension - BP is soft at 100/56 during today's visit. She is currently on Coreg  6.25 mg twice daily and Losartan  25 mg daily. Will reduce Coreg  to 3.125 mg twice daily given her soft BP and associated dizziness.  4. Hyperlipidemia LDL goal <70 - FLP in 08/2024 LDL was elevated to 102 and she was started on Atorvastatin  10 mg daily. Would recheck an FLP and  LFT's at the time of her next office visit if not checked by her PCP in the interim.  Signed, Laymon CHRISTELLA Qua, PA-C

## 2024-08-30 ENCOUNTER — Encounter: Payer: Self-pay | Admitting: Student

## 2024-08-30 ENCOUNTER — Ambulatory Visit: Attending: Student | Admitting: Student

## 2024-08-30 VITALS — BP 100/56 | HR 76 | Ht 64.0 in | Wt 137.0 lb

## 2024-08-30 DIAGNOSIS — E785 Hyperlipidemia, unspecified: Secondary | ICD-10-CM | POA: Diagnosis not present

## 2024-08-30 DIAGNOSIS — I2510 Atherosclerotic heart disease of native coronary artery without angina pectoris: Secondary | ICD-10-CM | POA: Diagnosis not present

## 2024-08-30 DIAGNOSIS — I10 Essential (primary) hypertension: Secondary | ICD-10-CM

## 2024-08-30 DIAGNOSIS — Z79899 Other long term (current) drug therapy: Secondary | ICD-10-CM | POA: Diagnosis not present

## 2024-08-30 DIAGNOSIS — I5181 Takotsubo syndrome: Secondary | ICD-10-CM

## 2024-08-30 MED ORDER — CARVEDILOL 3.125 MG PO TABS: 3.1250 mg | ORAL_TABLET | Freq: Two times a day (BID) | ORAL | 3 refills | Status: DC

## 2024-08-30 NOTE — Patient Instructions (Signed)
 Medication Instructions:   Decrease Coreg  to 3.125 Two Times Daily   *If you need a refill on your cardiac medications before your next appointment, please call your pharmacy*  Lab Work: Your physician recommends that you return for lab work in: Next Week ( BMET)   If you have labs (blood work) drawn today and your tests are completely normal, you will receive your results only by: MyChart Message (if you have MyChart) OR A paper copy in the mail If you have any lab test that is abnormal or we need to change your treatment, we will call you to review the results.  Testing/Procedures: Your physician has requested that you have an echocardiogram. Echocardiography is a painless test that uses sound waves to create images of your heart. It provides your doctor with information about the size and shape of your heart and how well your heart's chambers and valves are working. This procedure takes approximately one hour. There are no restrictions for this procedure. Please do NOT wear cologne, perfume, aftershave, or lotions (deodorant is allowed). Please arrive 15 minutes prior to your appointment time.  Please note: We ask at that you not bring children with you during ultrasound (echo/ vascular) testing. Due to room size and safety concerns, children are not allowed in the ultrasound rooms during exams. Our front office staff cannot provide observation of children in our lobby area while testing is being conducted. An adult accompanying a patient to their appointment will only be allowed in the ultrasound room at the discretion of the ultrasound technician under special circumstances. We apologize for any inconvenience.   Follow-Up: At [facility], you and your health needs are our priority.  As part of our continuing mission to provide you with exceptional heart care, our providers are all part of one team.  This team includes your primary Cardiologist (physician) and Advanced Practice  Providers or APPs (Physician Assistants and Nurse Practitioners) who all work together to provide you with the care you need, when you need it.  Your next appointment:   2 month(s)  Provider:   Laymon Qua, PA-C    We recommend signing up for the patient portal called MyChart.  Sign up information is provided on this After Visit Summary.  MyChart is used to connect with patients for Virtual Visits (Telemedicine).  Patients are able to view lab/test results, encounter notes, upcoming appointments, etc.  Non-urgent messages can be sent to your provider as well.   To learn more about what you can do with MyChart, go to [email or web address].   Other Instructions Thank you for choosing [facility]!

## 2024-08-31 ENCOUNTER — Telehealth (HOSPITAL_COMMUNITY): Payer: Self-pay | Admitting: Cardiology

## 2024-08-31 NOTE — Telephone Encounter (Signed)
 Pt states that she feels that she can not take Lipitor. She states she felt great on Tuesday. She c/o sore throat, fatigue and feeling  like I was hit by a bus. She feels that Lipitor is too much for her body. Pt states she was taking Crestor  and was stopped but does not remember why. Wants to wait and see what the blood work says just being on the Zetia . Please advise.

## 2024-08-31 NOTE — Telephone Encounter (Signed)
 Patient left VM on triage line with concerns about lipitor  Reports she was restarted on medication while in the hospital and has tried taking since discharge. Reports increase in fatigue, leg pain, and increase in blood sugar. Explains symptoms are all related to Lipitor  Patient followed with [facility] Will route call

## 2024-09-01 ENCOUNTER — Telehealth: Payer: Self-pay | Admitting: Internal Medicine

## 2024-09-01 NOTE — Telephone Encounter (Signed)
 Left a message for patient to call office in regards to provider's recommendations.

## 2024-09-01 NOTE — Telephone Encounter (Signed)
 Pt c/o BP issue: STAT if pt c/o blurred vision, one-sided weakness or slurred speech.  STAT if BP is GREATER than 180/120 TODAY.  STAT if BP is LESS than 90/60 and SYMPTOMATIC TODAY  1. What is your BP concern?   Patient is concerned she has been having low BP readings  2. Have you taken any BP medication today?  Yes  3. What are your last 5 BP readings?  107/69 84/61 87/61   4. Are you having any other symptoms (ex. Dizziness, headache, blurred vision, passed out)?   Headache and a little bit of dizziness  Patient is concerned her BP has been trending low and her right arm has been hurting where she had her heart cath.

## 2024-09-01 NOTE — Telephone Encounter (Signed)
 Spoke with patient - suggested that she take 1/2 dose of her Losartan  25mg  on Saturday & Sunday & update the office on Monday in regards to her BP readings.  No c/o of chest pain or SOB.  Did note some dizziness.  Recommend that she stay hydrated as well - Gatorade, propel, etc.. Also, reminded her to take readings 2 hours after morning medications with sitting 5-10 minutes prior.  She also mentions that she has stopped the Lipitor due to it making her feel very tired and just out of it.  Feeling a lot better off of it.  She states that she thinks that she may need to go back to what she was doing before with the exercising & eating better.  Started taking Super Beets with Co-q 10 as well.  Will await her reply on Monday with BP readings.

## 2024-09-01 NOTE — Telephone Encounter (Signed)
   Would order a repeat FLP in 2 months for reassessment of her cholesterol with just taking Zetia . If LDL remains above goal, could retry Crestor  but at a lower dose of 10 mg daily (appears she was on 20 mg in the past).  Signed, Laymon CHRISTELLA Qua, PA-C 09/01/2024, 7:33 AM Pager: [phone]

## 2024-09-03 ENCOUNTER — Encounter: Payer: Self-pay | Admitting: Internal Medicine

## 2024-09-04 ENCOUNTER — Encounter (HOSPITAL_COMMUNITY)

## 2024-09-05 ENCOUNTER — Telehealth: Payer: Self-pay | Admitting: Internal Medicine

## 2024-09-05 NOTE — Telephone Encounter (Signed)
 I spoke with Sara Mcmahon again. She agrees to stop beet supplements and update us  with her BP's.

## 2024-09-05 NOTE — Telephone Encounter (Signed)
 Pt states that she feels that she only needs 1/2 Losartan . She also takes Super beets CoQ10. She reports BP as 105/63 72, 98/61 69, and 102/68 77 with whole tablet of Losartan . She did feel a little dizzy when it was 98/61. Please advise.

## 2024-09-05 NOTE — Telephone Encounter (Signed)
 Pt called back in and stated she just got off the phone with the nurse, she took her bp after getting off the phone and it was 91/63 with Hr of 75.  She would like to know with it being that low, should she go get her blood work done today?    Best number  [phone]

## 2024-09-05 NOTE — Telephone Encounter (Signed)
 I spoke with Ms.Packett and she repeated her BP again and her systolic was 92. She tells me for the last 2 days she has accidentally taken losartan  25 mg. She swill stop losaratn and call back tomorrow with update    09/04/24 12:25 PM Do you feel dizzy with losartan  12.5 mg? If not, I would continue losartan  at the current dose. If you feel dizzy on losartan  12.5, then need to stop.

## 2024-09-05 NOTE — Telephone Encounter (Signed)
   I would advise against using beet supplements as they can cause transient drops in blood pressure and non-sustained lowering of BP which could certainly lead to symptoms when taking this and her current cardiac medications. If BP continues to be low (SBP < 100) with stopping the supplement, would then reduce Losartan  to 12.5mg  daily. We are trying to keep her on even low doses of cardiac medications given her recent cardiomyopathy.     Signed, Laymon CHRISTELLA Qua, PA-C 09/05/2024, 1:39 PM Pager: [phone]

## 2024-09-06 ENCOUNTER — Ambulatory Visit: Payer: Self-pay | Admitting: Student

## 2024-09-06 ENCOUNTER — Other Ambulatory Visit (HOSPITAL_COMMUNITY)
Admission: RE | Admit: 2024-09-06 | Discharge: 2024-09-06 | Disposition: A | Discharge status: Home / Self Care | Source: Ambulatory Visit | Attending: Student | Admitting: Student

## 2024-09-06 DIAGNOSIS — Z79899 Other long term (current) drug therapy: Secondary | ICD-10-CM | POA: Insufficient documentation

## 2024-09-06 LAB — BASIC METABOLIC PANEL WITH GFR
Anion gap: 10 (ref 5–15)
BUN: 12 mg/dL (ref 8–23)
CO2: 27 mmol/L (ref 22–32)
Calcium: 9.8 mg/dL (ref 8.9–10.3)
Chloride: 104 mmol/L (ref 98–111)
Creatinine, Ser: 0.91 mg/dL (ref 0.44–1.00)
GFR, Estimated: >60 mL/min (ref >60)
Glucose, Bld: 87 mg/dL (ref 70–99)
Potassium: 4.5 mmol/L (ref 3.5–5.1)
Sodium: 141 mmol/L (ref 135–145)

## 2024-10-19 ENCOUNTER — Ambulatory Visit: Admitting: Pharmacist

## 2024-10-30 ENCOUNTER — Ambulatory Visit (HOSPITAL_COMMUNITY)

## 2024-11-17 ENCOUNTER — Ambulatory Visit: Admitting: Internal Medicine

## 2024-12-04 ENCOUNTER — Ambulatory Visit (HOSPITAL_COMMUNITY)
Admission: RE | Admit: 2024-12-04 | Discharge: 2024-12-04 | Disposition: A | Discharge status: Home / Self Care | Source: Ambulatory Visit | Attending: Student | Admitting: Student

## 2024-12-04 DIAGNOSIS — I5181 Takotsubo syndrome: Secondary | ICD-10-CM | POA: Diagnosis not present

## 2024-12-04 LAB — ECHOCARDIOGRAM LIMITED
Calc EF: 62.7 %
S' Lateral: 3.3 cm
Single Plane A2C EF: 66.8 %
Single Plane A4C EF: 57.6 %

## 2024-12-04 NOTE — Progress Notes (Signed)
*  PRELIMINARY RESULTS* Echocardiogram Limited 2-D Echocardiogram has been performed.  Sara Mcmahon 12/04/2024, 2:17 PM

## 2024-12-18 ENCOUNTER — Ambulatory Visit: Admitting: Physician Assistant

## 2025-01-10 ENCOUNTER — Ambulatory Visit: Admitting: Student

## 2025-01-10 ENCOUNTER — Encounter: Payer: Self-pay | Admitting: Student

## 2025-01-10 VITALS — BP 118/60 | HR 80 | Ht 63.5 in | Wt 140.8 lb

## 2025-01-10 DIAGNOSIS — I5181 Takotsubo syndrome: Secondary | ICD-10-CM

## 2025-01-10 DIAGNOSIS — I2510 Atherosclerotic heart disease of native coronary artery without angina pectoris: Secondary | ICD-10-CM

## 2025-01-10 DIAGNOSIS — E785 Hyperlipidemia, unspecified: Secondary | ICD-10-CM | POA: Diagnosis not present

## 2025-01-10 DIAGNOSIS — I10 Essential (primary) hypertension: Secondary | ICD-10-CM | POA: Diagnosis not present

## 2025-01-10 MED ORDER — CARVEDILOL 3.125 MG PO TABS: 3.1250 mg | ORAL_TABLET | Freq: Two times a day (BID) | ORAL | 3 refills | Status: AC

## 2025-01-10 MED ORDER — LOSARTAN POTASSIUM 25 MG PO TABS: 12.5000 mg | ORAL_TABLET | Freq: Every day | ORAL | 3 refills | Status: AC

## 2025-01-10 NOTE — Patient Instructions (Signed)
 Medication Instructions:  Your physician recommends that you continue on your current medications as directed. Please refer to the Current Medication list given to you today.  *If you need a refill on your cardiac medications before your next appointment, please call your pharmacy*  Lab Work: NONE   If you have labs (blood work) drawn today and your tests are completely normal, you will receive your results only by: MyChart Message (if you have MyChart) OR A paper copy in the mail If you have any lab test that is abnormal or we need to change your treatment, we will call you to review the results.  Testing/Procedures: NONE   Follow-Up: At [facility], you and your health needs are our priority.  As part of our continuing mission to provide you with exceptional heart care, our providers are all part of one team.  This team includes your primary Cardiologist (physician) and Advanced Practice Providers or APPs (Physician Assistants and Nurse Practitioners) who all work together to provide you with the care you need, when you need it.  Your next appointment:   6 month(s)  Provider:   Vishnu Mallipeddi, MD or Laymon Qua, PA-C    We recommend signing up for the patient portal called MyChart.  Sign up information is provided on this After Visit Summary.  MyChart is used to connect with patients for Virtual Visits (Telemedicine).  Patients are able to view lab/test results, encounter notes, upcoming appointments, etc.  Non-urgent messages can be sent to your provider as well.   To learn more about what you can do with MyChart, go to [email or web address].   Other Instructions Thank you for choosing [facility]!
